# Patient Record
Sex: Female | Born: 1950 | Race: White | Hispanic: No | Smoking: Never smoker
Health system: Southern US, Community
[De-identification: ages and names within clinical notes are randomized; demographics above are authoritative.]

## PROBLEM LIST (undated history)

## (undated) DIAGNOSIS — M1990 Unspecified osteoarthritis, unspecified site: Secondary | ICD-10-CM

## (undated) DIAGNOSIS — R112 Nausea with vomiting, unspecified: Secondary | ICD-10-CM

## (undated) DIAGNOSIS — S060X9A Concussion with loss of consciousness of unspecified duration, initial encounter: Secondary | ICD-10-CM

## (undated) DIAGNOSIS — R2243 Localized swelling, mass and lump, lower limb, bilateral: Secondary | ICD-10-CM

## (undated) DIAGNOSIS — K760 Fatty (change of) liver, not elsewhere classified: Secondary | ICD-10-CM

## (undated) DIAGNOSIS — R519 Headache, unspecified: Secondary | ICD-10-CM

## (undated) DIAGNOSIS — M797 Fibromyalgia: Secondary | ICD-10-CM

## (undated) DIAGNOSIS — G5600 Carpal tunnel syndrome, unspecified upper limb: Secondary | ICD-10-CM

## (undated) DIAGNOSIS — Z9889 Other specified postprocedural states: Secondary | ICD-10-CM

## (undated) DIAGNOSIS — E041 Nontoxic single thyroid nodule: Secondary | ICD-10-CM

## (undated) DIAGNOSIS — S060XAA Concussion with loss of consciousness status unknown, initial encounter: Secondary | ICD-10-CM

## (undated) DIAGNOSIS — N840 Polyp of corpus uteri: Secondary | ICD-10-CM

## (undated) DIAGNOSIS — T8859XA Other complications of anesthesia, initial encounter: Secondary | ICD-10-CM

## (undated) DIAGNOSIS — K589 Irritable bowel syndrome without diarrhea: Secondary | ICD-10-CM

## (undated) HISTORY — DX: Irritable bowel syndrome, unspecified: K58.9

## (undated) HISTORY — DX: Carpal tunnel syndrome, unspecified upper limb: G56.00

## (undated) HISTORY — DX: Morbid (severe) obesity due to excess calories: E66.01

## (undated) HISTORY — PX: IVC FILTER REMOVAL: CATH118246

## (undated) HISTORY — PX: OTHER SURGICAL HISTORY: SHX169

## (undated) HISTORY — DX: Fatty (change of) liver, not elsewhere classified: K76.0

## (undated) HISTORY — DX: Unspecified osteoarthritis, unspecified site: M19.90

---
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
## 1973-07-10 DIAGNOSIS — Z9289 Personal history of other medical treatment: Secondary | ICD-10-CM

## 1973-07-10 HISTORY — PX: KNEE SURGERY: SHX244

## 1973-07-10 HISTORY — DX: Personal history of other medical treatment: Z92.89

## 1998-03-04 ENCOUNTER — Observation Stay (HOSPITAL_COMMUNITY): Admission: EM | Admit: 1998-03-04 | Discharge: 1998-03-05 | Payer: Self-pay | Admitting: Emergency Medicine

## 1998-03-04 ENCOUNTER — Encounter: Payer: Self-pay | Admitting: Emergency Medicine

## 2001-02-06 ENCOUNTER — Encounter: Payer: Self-pay | Admitting: Orthopaedic Surgery

## 2001-02-06 ENCOUNTER — Encounter: Admission: RE | Admit: 2001-02-06 | Discharge: 2001-02-06 | Payer: Self-pay | Admitting: Orthopaedic Surgery

## 2003-12-09 HISTORY — PX: TOTAL KNEE ARTHROPLASTY: SHX125

## 2004-05-10 HISTORY — PX: TOTAL KNEE ARTHROPLASTY: SHX125

## 2008-07-10 HISTORY — PX: OTHER SURGICAL HISTORY: SHX169

## 2008-07-10 HISTORY — PX: LIVER BIOPSY: SHX301

## 2011-10-19 DIAGNOSIS — K7581 Nonalcoholic steatohepatitis (NASH): Secondary | ICD-10-CM | POA: Insufficient documentation

## 2015-03-22 ENCOUNTER — Telehealth: Payer: Self-pay | Admitting: Behavioral Health

## 2015-03-22 NOTE — Telephone Encounter (Signed)
Unable to reach patient at time of Pre-Visit Call.  Left message for patient to return call when available.    

## 2015-03-23 ENCOUNTER — Encounter: Payer: Self-pay | Admitting: Physician Assistant

## 2015-03-23 ENCOUNTER — Ambulatory Visit (INDEPENDENT_AMBULATORY_CARE_PROVIDER_SITE_OTHER): Payer: Medicare Other | Admitting: Physician Assistant

## 2015-03-23 VITALS — BP 120/88 | HR 76 | Temp 97.7°F | Resp 16 | Ht 69.5 in | Wt 331.0 lb

## 2015-03-23 DIAGNOSIS — K7581 Nonalcoholic steatohepatitis (NASH): Secondary | ICD-10-CM

## 2015-03-23 DIAGNOSIS — M799 Soft tissue disorder, unspecified: Secondary | ICD-10-CM

## 2015-03-23 DIAGNOSIS — R938 Abnormal findings on diagnostic imaging of other specified body structures: Secondary | ICD-10-CM

## 2015-03-23 DIAGNOSIS — H579 Unspecified disorder of eye and adnexa: Secondary | ICD-10-CM | POA: Insufficient documentation

## 2015-03-23 LAB — COMPREHENSIVE METABOLIC PANEL
ALBUMIN: 4.1 g/dL (ref 3.5–5.2)
ALT: 15 U/L (ref 0–35)
AST: 20 U/L (ref 0–37)
Alkaline Phosphatase: 87 U/L (ref 39–117)
BUN: 18 mg/dL (ref 6–23)
CHLORIDE: 104 meq/L (ref 96–112)
CO2: 30 meq/L (ref 19–32)
CREATININE: 0.69 mg/dL (ref 0.40–1.20)
Calcium: 9.6 mg/dL (ref 8.4–10.5)
GFR: 91.14 mL/min (ref >60.00)
GLUCOSE: 99 mg/dL (ref 70–99)
POTASSIUM: 4.5 meq/L (ref 3.5–5.1)
SODIUM: 140 meq/L (ref 135–145)
Total Bilirubin: 0.6 mg/dL (ref 0.2–1.2)
Total Protein: 7.1 g/dL (ref 6.0–8.3)

## 2015-03-23 LAB — LIPID PANEL
CHOL/HDL RATIO: 3
Cholesterol: 193 mg/dL (ref 0–200)
HDL: 60 mg/dL (ref >39.00)
LDL CALC: 114 mg/dL — AB (ref 0–99)
NONHDL: 133.09
Triglycerides: 94 mg/dL (ref 0.0–149.0)
VLDL: 18.8 mg/dL (ref 0.0–40.0)
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
## 2015-03-23 NOTE — Progress Notes (Signed)
Pre visit review using our clinic review tool, if applicable. No additional management support is needed unless otherwise documented below in the visit note/SLS  

## 2015-03-23 NOTE — Assessment & Plan Note (Signed)
Will repeat CMP and Lipid panel.

## 2015-03-23 NOTE — Patient Instructions (Signed)
Please continue your supplements as directed. I have referred you to Dr. Bing Plume (Ophthalmology) so keep your phone on. Stay active and continue the [place] massages.  Schedule an appointment for a physical when you are due.

## 2015-03-23 NOTE — Assessment & Plan Note (Signed)
Patient in need of new OP. Referral to [facility] placed.

## 2015-03-23 NOTE — Assessment & Plan Note (Signed)
Osteoarthritis of multiple joints with foot drop and muscular weakness. Followed by PT, Chiropractor and Massage therapists. Continue current regimen. Discussed Tumeric as an antiinflammatory. Start a 500 mg capsule daily to see if this adds benefit.

## 2015-08-05 NOTE — Telephone Encounter (Signed)
Pt declines flu shot.  

## 2016-02-07 ENCOUNTER — Encounter: Payer: Self-pay | Admitting: Physician Assistant

## 2016-03-07 ENCOUNTER — Encounter: Payer: Medicare Other | Admitting: Physician Assistant

## 2016-03-08 ENCOUNTER — Other Ambulatory Visit: Payer: Self-pay | Admitting: Physician Assistant

## 2016-03-08 ENCOUNTER — Ambulatory Visit (INDEPENDENT_AMBULATORY_CARE_PROVIDER_SITE_OTHER): Payer: Medicare Other | Admitting: Physician Assistant

## 2016-03-08 VITALS — BP 130/70 | HR 91 | Temp 98.7°F | Ht 70.0 in | Wt 334.0 lb

## 2016-03-08 DIAGNOSIS — Z0000 Encounter for general adult medical examination without abnormal findings: Secondary | ICD-10-CM | POA: Diagnosis not present

## 2016-03-08 DIAGNOSIS — Z1231 Encounter for screening mammogram for malignant neoplasm of breast: Secondary | ICD-10-CM

## 2016-03-08 DIAGNOSIS — K7581 Nonalcoholic steatohepatitis (NASH): Secondary | ICD-10-CM

## 2016-03-08 LAB — CBC
HCT: 46.1 % — ABNORMAL HIGH (ref 36.0–46.0)
HEMOGLOBIN: 15.7 g/dL — AB (ref 12.0–15.0)
MCHC: 34 g/dL (ref 30.0–36.0)
MCV: 87.1 fl (ref 78.0–100.0)
Platelets: 226 10*3/uL (ref 150.0–400.0)
RBC: 5.3 Mil/uL — ABNORMAL HIGH (ref 3.87–5.11)
RDW: 13.1 % (ref 11.5–15.5)
WBC: 6.7 10*3/uL (ref 4.0–10.5)

## 2016-03-08 LAB — LIPID PANEL
Cholesterol: 239 mg/dL — ABNORMAL HIGH (ref 0–200)
HDL: 55.5 mg/dL (ref >39.00)
LDL Cholesterol: 150 mg/dL — ABNORMAL HIGH (ref 0–99)
NONHDL: 183.58
Total CHOL/HDL Ratio: 4
Triglycerides: 166 mg/dL — ABNORMAL HIGH (ref 0.0–149.0)
VLDL: 33.2 mg/dL (ref 0.0–40.0)

## 2016-03-08 LAB — URINALYSIS, ROUTINE W REFLEX MICROSCOPIC
BILIRUBIN URINE: NEGATIVE
KETONES UR: NEGATIVE
Nitrite: NEGATIVE
PH: 6.5 (ref 5.0–8.0)
Specific Gravity, Urine: 1.015 (ref 1.000–1.030)
TOTAL PROTEIN, URINE-UPE24: NEGATIVE
UROBILINOGEN UA: 0.2 (ref 0.0–1.0)
Urine Glucose: NEGATIVE

## 2016-03-08 LAB — COMPREHENSIVE METABOLIC PANEL
ALK PHOS: 82 U/L (ref 39–117)
ALT: 18 U/L (ref 0–35)
AST: 20 U/L (ref 0–37)
Albumin: 4.1 g/dL (ref 3.5–5.2)
BILIRUBIN TOTAL: 0.6 mg/dL (ref 0.2–1.2)
BUN: 12 mg/dL (ref 6–23)
CO2: 31 mEq/L (ref 19–32)
CREATININE: 0.76 mg/dL (ref 0.40–1.20)
Calcium: 9.3 mg/dL (ref 8.4–10.5)
Chloride: 105 mEq/L (ref 96–112)
GFR: 81.28 mL/min (ref >60.00)
GLUCOSE: 109 mg/dL — AB (ref 70–99)
Potassium: 4.9 mEq/L (ref 3.5–5.1)
SODIUM: 141 meq/L (ref 135–145)
TOTAL PROTEIN: 7 g/dL (ref 6.0–8.3)

## 2016-03-08 LAB — HEMOGLOBIN A1C: HEMOGLOBIN A1C: 5.7 % (ref 4.6–6.5)

## 2016-03-08 LAB — TSH: TSH: 1.4 u[IU]/mL (ref 0.35–4.50)

## 2016-03-08 NOTE — Patient Instructions (Signed)
Please go to the lab for blood work. I will call with your results.  Please have your records sent to [place] from [facility]. The ladies at the front desk/check out can give you a form to help with this.  Please check with insurance to check on coverage for TDaP and Zostavax.  I want you to put yourself first more.  You cannot take care of everyone if you do not take care of yourself first.  Start the diet when Reed Pandy starts his. Try to get back to the pool more often. Consider letting me set you up for physical therapy.   Preventive Care for Adults, Female A healthy lifestyle and preventive care can promote health and wellness. Preventive health guidelines for women include the following key practices.  A routine yearly physical is a good way to check with your health care provider about your health and preventive screening. It is a chance to share any concerns and updates on your health and to receive a thorough exam.  Visit your dentist for a routine exam and preventive care every 6 months. Brush your teeth twice a day and floss once a day. Good oral hygiene prevents tooth decay and gum disease.  The frequency of eye exams is based on your age, health, family medical history, use of contact lenses, and other factors. Follow your health care provider's recommendations for frequency of eye exams.  Eat a healthy diet. Foods like vegetables, fruits, whole grains, low-fat dairy products, and lean protein foods contain the nutrients you need without too many calories. Decrease your intake of foods high in solid fats, added sugars, and salt. Eat the right amount of calories for you.Get information about a proper diet from your health care provider, if necessary.  Regular physical exercise is one of the most important things you can do for your health. Most adults should get at least 150 minutes of moderate-intensity exercise (any activity that increases your heart rate and causes you to sweat) each  week. In addition, most adults need muscle-strengthening exercises on 2 or more days a week.  Maintain a healthy weight. The body mass index (BMI) is a screening tool to identify possible weight problems. It provides an estimate of body fat based on height and weight. Your health care provider can find your BMI and can help you achieve or maintain a healthy weight.For adults 20 years and older:  A BMI below 18.5 is considered underweight.  A BMI of 18.5 to 24.9 is normal.  A BMI of 25 to 29.9 is considered overweight.  A BMI of 30 and above is considered obese.  Maintain normal blood lipids and cholesterol levels by exercising and minimizing your intake of saturated fat. Eat a balanced diet with plenty of fruit and vegetables. Blood tests for lipids and cholesterol should begin at age 5 and be repeated every 5 years. If your lipid or cholesterol levels are high, you are over 50, or you are at high risk for heart disease, you may need your cholesterol levels checked more frequently.Ongoing high lipid and cholesterol levels should be treated with medicines if diet and exercise are not working.  If you smoke, find out from your health care provider how to quit. If you do not use tobacco, do not start.  Lung cancer screening is recommended for adults aged 73-80 years who are at high risk for developing lung cancer because of a history of smoking. A yearly low-dose CT scan of the lungs is recommended for people  who have at least a 30-pack-year history of smoking and are a current smoker or have quit within the past 15 years. A pack year of smoking is smoking an average of 1 pack of cigarettes a day for 1 year (for example: 1 pack a day for 30 years or 2 packs a day for 15 years). Yearly screening should continue until the smoker has stopped smoking for at least 15 years. Yearly screening should be stopped for people who develop a health problem that would prevent them from having lung cancer  treatment.  If you are pregnant, do not drink alcohol. If you are breastfeeding, be very cautious about drinking alcohol. If you are not pregnant and choose to drink alcohol, do not have more than 1 drink per day. One drink is considered to be 12 ounces (355 mL) of beer, 5 ounces (148 mL) of wine, or 1.5 ounces (44 mL) of liquor.  Avoid use of street drugs. Do not share needles with anyone. Ask for help if you need support or instructions about stopping the use of drugs.  High blood pressure causes heart disease and increases the risk of stroke. Your blood pressure should be checked at least every 1 to 2 years. Ongoing high blood pressure should be treated with medicines if weight loss and exercise do not work.  If you are 2-51 years old, ask your health care provider if you should take aspirin to prevent strokes.  Diabetes screening is done by taking a blood sample to check your blood glucose level after you have not eaten for a certain period of time (fasting). If you are not overweight and you do not have risk factors for diabetes, you should be screened once every 3 years starting at age 52. If you are overweight or obese and you are 84-62 years of age, you should be screened for diabetes every year as part of your cardiovascular risk assessment.  Breast cancer screening is essential preventive care for women. You should practice "breast self-awareness." This means understanding the normal appearance and feel of your breasts and may include breast self-examination. Any changes detected, no matter how small, should be reported to a health care provider. Women in their 53s and 30s should have a clinical breast exam (CBE) by a health care provider as part of a regular health exam every 1 to 3 years. After age 71, women should have a CBE every year. Starting at age 68, women should consider having a mammogram (breast X-ray test) every year. Women who have a family history of breast cancer should talk to  their health care provider about genetic screening. Women at a high risk of breast cancer should talk to their health care providers about having an MRI and a mammogram every year.  Breast cancer gene (BRCA)-related cancer risk assessment is recommended for women who have family members with BRCA-related cancers. BRCA-related cancers include breast, ovarian, tubal, and peritoneal cancers. Having family members with these cancers may be associated with an increased risk for harmful changes (mutations) in the breast cancer genes BRCA1 and BRCA2. Results of the assessment will determine the need for genetic counseling and BRCA1 and BRCA2 testing.  Your health care provider may recommend that you be screened regularly for cancer of the pelvic organs (ovaries, uterus, and vagina). This screening involves a pelvic examination, including checking for microscopic changes to the surface of your cervix (Pap test). You may be encouraged to have this screening done every 3 years, beginning at age  79.  For women ages 40-65, health care providers may recommend pelvic exams and Pap testing every 3 years, or they may recommend the Pap and pelvic exam, combined with testing for human papilloma virus (HPV), every 5 years. Some types of HPV increase your risk of cervical cancer. Testing for HPV may also be done on women of any age with unclear Pap test results.  Other health care providers may not recommend any screening for nonpregnant women who are considered low risk for pelvic cancer and who do not have symptoms. Ask your health care provider if a screening pelvic exam is right for you.  If you have had past treatment for cervical cancer or a condition that could lead to cancer, you need Pap tests and screening for cancer for at least 20 years after your treatment. If Pap tests have been discontinued, your risk factors (such as having a new sexual partner) need to be reassessed to determine if screening should resume.  Some women have medical problems that increase the chance of getting cervical cancer. In these cases, your health care provider may recommend more frequent screening and Pap tests.  Colorectal cancer can be detected and often prevented. Most routine colorectal cancer screening begins at the age of 72 years and continues through age 33 years. However, your health care provider may recommend screening at an earlier age if you have risk factors for colon cancer. On a yearly basis, your health care provider may provide home test kits to check for hidden blood in the stool. Use of a small camera at the end of a tube, to directly examine the colon (sigmoidoscopy or colonoscopy), can detect the earliest forms of colorectal cancer. Talk to your health care provider about this at age 55, when routine screening begins. Direct exam of the colon should be repeated every 5-10 years through age 42 years, unless early forms of precancerous polyps or small growths are found.  People who are at an increased risk for hepatitis B should be screened for this virus. You are considered at high risk for hepatitis B if:  You were born in a country where hepatitis B occurs often. Talk with your health care provider about which countries are considered high risk.  Your parents were born in a high-risk country and you have not received a shot to protect against hepatitis B (hepatitis B vaccine).  You have HIV or AIDS.  You use needles to inject street drugs.  You live with, or have sex with, someone who has hepatitis B.  You get hemodialysis treatment.  You take certain medicines for conditions like cancer, organ transplantation, and autoimmune conditions.  Hepatitis C blood testing is recommended for all people born from [birth year] through 1964 and any individual with known risks for hepatitis C.  Practice safe sex. Use condoms and avoid high-risk sexual practices to reduce the spread of sexually transmitted infections  (STIs). STIs include gonorrhea, chlamydia, syphilis, trichomonas, herpes, HPV, and human immunodeficiency virus (HIV). Herpes, HIV, and HPV are viral illnesses that have no cure. They can result in disability, cancer, and death.  You should be screened for sexually transmitted illnesses (STIs) including gonorrhea and chlamydia if:  You are sexually active and are younger than 24 years.  You are older than 24 years and your health care provider tells you that you are at risk for this type of infection.  Your sexual activity has changed since you were last screened and you are at an increased risk for  chlamydia or gonorrhea. Ask your health care provider if you are at risk.  If you are at risk of being infected with HIV, it is recommended that you take a prescription medicine daily to prevent HIV infection. This is called preexposure prophylaxis (PrEP). You are considered at risk if:  You are sexually active and do not regularly use condoms or know the HIV status of your partner(s).  You take drugs by injection.  You are sexually active with a partner who has HIV.  Talk with your health care provider about whether you are at high risk of being infected with HIV. If you choose to begin PrEP, you should first be tested for HIV. You should then be tested every 3 months for as long as you are taking PrEP.  Osteoporosis is a disease in which the bones lose minerals and strength with aging. This can result in serious bone fractures or breaks. The risk of osteoporosis can be identified using a bone density scan. Women ages 10 years and over and women at risk for fractures or osteoporosis should discuss screening with their health care providers. Ask your health care provider whether you should take a calcium supplement or vitamin D to reduce the rate of osteoporosis.  Menopause can be associated with physical symptoms and risks. Hormone replacement therapy is available to decrease symptoms and risks.  You should talk to your health care provider about whether hormone replacement therapy is right for you.  Use sunscreen. Apply sunscreen liberally and repeatedly throughout the day. You should seek shade when your shadow is shorter than you. Protect yourself by wearing long sleeves, pants, a wide-brimmed hat, and sunglasses year round, whenever you are outdoors.  Once a month, do a whole body skin exam, using a mirror to look at the skin on your back. Tell your health care provider of new moles, moles that have irregular borders, moles that are larger than a pencil eraser, or moles that have changed in shape or color.  Stay current with required vaccines (immunizations).  Influenza vaccine. All adults should be immunized every year.  Tetanus, diphtheria, and acellular pertussis (Td, Tdap) vaccine. Pregnant women should receive 1 dose of Tdap vaccine during each pregnancy. The dose should be obtained regardless of the length of time since the last dose. Immunization is preferred during the 27th-36th week of gestation. An adult who has not previously received Tdap or who does not know her vaccine status should receive 1 dose of Tdap. This initial dose should be followed by tetanus and diphtheria toxoids (Td) booster doses every 10 years. Adults with an unknown or incomplete history of completing a 3-dose immunization series with Td-containing vaccines should begin or complete a primary immunization series including a Tdap dose. Adults should receive a Td booster every 10 years.  Varicella vaccine. An adult without evidence of immunity to varicella should receive 2 doses or a second dose if she has previously received 1 dose. Pregnant females who do not have evidence of immunity should receive the first dose after pregnancy. This first dose should be obtained before leaving the health care facility. The second dose should be obtained 4-8 weeks after the first dose.  Human papillomavirus (HPV) vaccine.  Females aged 13-26 years who have not received the vaccine previously should obtain the 3-dose series. The vaccine is not recommended for use in pregnant females. However, pregnancy testing is not needed before receiving a dose. If a female is found to be pregnant after receiving a dose,  no treatment is needed. In that case, the remaining doses should be delayed until after the pregnancy. Immunization is recommended for any person with an immunocompromised condition through the age of 56 years if she did not get any or all doses earlier. During the 3-dose series, the second dose should be obtained 4-8 weeks after the first dose. The third dose should be obtained 24 weeks after the first dose and 16 weeks after the second dose.  Zoster vaccine. One dose is recommended for adults aged 43 years or older unless certain conditions are present.  Measles, mumps, and rubella (MMR) vaccine. Adults born before 65 generally are considered immune to measles and mumps. Adults born in [birth year] or later should have 1 or more doses of MMR vaccine unless there is a contraindication to the vaccine or there is laboratory evidence of immunity to each of the three diseases. A routine second dose of MMR vaccine should be obtained at least 28 days after the first dose for students attending postsecondary schools, health care workers, or international travelers. People who received inactivated measles vaccine or an unknown type of measles vaccine during 1963-1967 should receive 2 doses of MMR vaccine. People who received inactivated mumps vaccine or an unknown type of mumps vaccine before 1979 and are at high risk for mumps infection should consider immunization with 2 doses of MMR vaccine. For females of childbearing age, rubella immunity should be determined. If there is no evidence of immunity, females who are not pregnant should be vaccinated. If there is no evidence of immunity, females who are pregnant should delay immunization  until after pregnancy. Unvaccinated health care workers born before [birth year] who lack laboratory evidence of measles, mumps, or rubella immunity or laboratory confirmation of disease should consider measles and mumps immunization with 2 doses of MMR vaccine or rubella immunization with 1 dose of MMR vaccine.  Pneumococcal 13-valent conjugate (PCV13) vaccine. When indicated, a person who is uncertain of his immunization history and has no record of immunization should receive the PCV13 vaccine. All adults 50 years of age and older should receive this vaccine. An adult aged 3 years or older who has certain medical conditions and has not been previously immunized should receive 1 dose of PCV13 vaccine. This PCV13 should be followed with a dose of pneumococcal polysaccharide (PPSV23) vaccine. Adults who are at high risk for pneumococcal disease should obtain the PPSV23 vaccine at least 8 weeks after the dose of PCV13 vaccine. Adults older than 64 years of age who have normal immune system function should obtain the PPSV23 vaccine dose at least 1 year after the dose of PCV13 vaccine.  Pneumococcal polysaccharide (PPSV23) vaccine. When PCV13 is also indicated, PCV13 should be obtained first. All adults aged 12 years and older should be immunized. An adult younger than age 32 years who has certain medical conditions should be immunized. Any person who resides in a nursing home or long-term care facility should be immunized. An adult smoker should be immunized. People with an immunocompromised condition and certain other conditions should receive both PCV13 and PPSV23 vaccines. People with human immunodeficiency virus (HIV) infection should be immunized as soon as possible after diagnosis. Immunization during chemotherapy or radiation therapy should be avoided. Routine use of PPSV23 vaccine is not recommended for American Indians, [place] Natives, or people younger than 65 years unless there are medical conditions that  require PPSV23 vaccine. When indicated, people who have unknown immunization and have no record of immunization should receive PPSV23 vaccine.  One-time revaccination 5 years after the first dose of PPSV23 is recommended for people aged 19-64 years who have chronic kidney failure, nephrotic syndrome, asplenia, or immunocompromised conditions. People who received 1-2 doses of PPSV23 before age 15 years should receive another dose of PPSV23 vaccine at age 46 years or later if at least 5 years have passed since the previous dose. Doses of PPSV23 are not needed for people immunized with PPSV23 at or after age 80 years.  Meningococcal vaccine. Adults with asplenia or persistent complement component deficiencies should receive 2 doses of quadrivalent meningococcal conjugate (MenACWY-D) vaccine. The doses should be obtained at least 2 months apart. Microbiologists working with certain meningococcal bacteria, [employer] recruits, people at risk during an outbreak, and people who travel to or live in countries with a high rate of meningitis should be immunized. A first-year college student up through age 7 years who is living in a residence hall should receive a dose if she did not receive a dose on or after her 16th birthday. Adults who have certain high-risk conditions should receive one or more doses of vaccine.  Hepatitis A vaccine. Adults who wish to be protected from this disease, have certain high-risk conditions, work with hepatitis A-infected animals, work in hepatitis A research labs, or travel to or work in countries with a high rate of hepatitis A should be immunized. Adults who were previously unvaccinated and who anticipate close contact with an international adoptee during the first 60 days after arrival in the [place] from a country with a high rate of hepatitis A should be immunized.  Hepatitis B vaccine. Adults who wish to be protected from this disease, have certain high-risk conditions, may be  exposed to blood or other infectious body fluids, are household contacts or sex partners of hepatitis B positive people, are clients or workers in certain care facilities, or travel to or work in countries with a high rate of hepatitis B should be immunized.  Haemophilus influenzae type b (Hib) vaccine. A previously unvaccinated person with asplenia or sickle cell disease or having a scheduled splenectomy should receive 1 dose of Hib vaccine. Regardless of previous immunization, a recipient of a hematopoietic stem cell transplant should receive a 3-dose series 6-12 months after her successful transplant. Hib vaccine is not recommended for adults with HIV infection. Preventive Services / Frequency Ages 85 to 59 years  Blood pressure check.** / Every 3-5 years.  Lipid and cholesterol check.** / Every 5 years beginning at age 39.  Clinical breast exam.** / Every 3 years for women in their 73s and 59s.  BRCA-related cancer risk assessment.** / For women who have family members with a BRCA-related cancer (breast, ovarian, tubal, or peritoneal cancers).  Pap test.** / Every 2 years from ages 62 through 79. Every 3 years starting at age 26 through age 81 or 16 with a history of 3 consecutive normal Pap tests.  HPV screening.** / Every 3 years from ages 38 through ages 67 to 107 with a history of 3 consecutive normal Pap tests.  Hepatitis C blood test.** / For any individual with known risks for hepatitis C.  Skin self-exam. / Monthly.  Influenza vaccine. / Every year.  Tetanus, diphtheria, and acellular pertussis (Tdap, Td) vaccine.** / Consult your health care provider. Pregnant women should receive 1 dose of Tdap vaccine during each pregnancy. 1 dose of Td every 10 years.  Varicella vaccine.** / Consult your health care provider. Pregnant females who do not have evidence of  immunity should receive the first dose after pregnancy.  HPV vaccine. / 3 doses over 6 months, if 74 and younger. The  vaccine is not recommended for use in pregnant females. However, pregnancy testing is not needed before receiving a dose.  Measles, mumps, rubella (MMR) vaccine.** / You need at least 1 dose of MMR if you were born in 1957 or later. You may also need a 2nd dose. For females of childbearing age, rubella immunity should be determined. If there is no evidence of immunity, females who are not pregnant should be vaccinated. If there is no evidence of immunity, females who are pregnant should delay immunization until after pregnancy.  Pneumococcal 13-valent conjugate (PCV13) vaccine.** / Consult your health care provider.  Pneumococcal polysaccharide (PPSV23) vaccine.** / 1 to 2 doses if you smoke cigarettes or if you have certain conditions.  Meningococcal vaccine.** / 1 dose if you are age 52 to 5 years and a Market researcher living in a residence hall, or have one of several medical conditions, you need to get vaccinated against meningococcal disease. You may also need additional booster doses.  Hepatitis A vaccine.** / Consult your health care provider.  Hepatitis B vaccine.** / Consult your health care provider.  Haemophilus influenzae type b (Hib) vaccine.** / Consult your health care provider. Ages 38 to 43 years  Blood pressure check.** / Every year.  Lipid and cholesterol check.** / Every 5 years beginning at age 23 years.  Lung cancer screening. / Every year if you are aged 8-80 years and have a 30-pack-year history of smoking and currently smoke or have quit within the past 15 years. Yearly screening is stopped once you have quit smoking for at least 15 years or develop a health problem that would prevent you from having lung cancer treatment.  Clinical breast exam.** / Every year after age 42 years.  BRCA-related cancer risk assessment.** / For women who have family members with a BRCA-related cancer (breast, ovarian, tubal, or peritoneal cancers).  Mammogram.** / Every  year beginning at age 58 years and continuing for as long as you are in good health. Consult with your health care provider.  Pap test.** / Every 3 years starting at age 23 years through age 93 or 82 years with a history of 3 consecutive normal Pap tests.  HPV screening.** / Every 3 years from ages 8 years through ages 55 to 55 years with a history of 3 consecutive normal Pap tests.  Fecal occult blood test (FOBT) of stool. / Every year beginning at age 42 years and continuing until age 54 years. You may not need to do this test if you get a colonoscopy every 10 years.  Flexible sigmoidoscopy or colonoscopy.** / Every 5 years for a flexible sigmoidoscopy or every 10 years for a colonoscopy beginning at age 66 years and continuing until age 56 years.  Hepatitis C blood test.** / For all people born from [birth year] through 1964 and any individual with known risks for hepatitis C.  Skin self-exam. / Monthly.  Influenza vaccine. / Every year.  Tetanus, diphtheria, and acellular pertussis (Tdap/Td) vaccine.** / Consult your health care provider. Pregnant women should receive 1 dose of Tdap vaccine during each pregnancy. 1 dose of Td every 10 years.  Varicella vaccine.** / Consult your health care provider. Pregnant females who do not have evidence of immunity should receive the first dose after pregnancy.  Zoster vaccine.** / 1 dose for adults aged 49 years or older.  Measles,  mumps, rubella (MMR) vaccine.** / You need at least 1 dose of MMR if you were born in 1957 or later. You may also need a second dose. For females of childbearing age, rubella immunity should be determined. If there is no evidence of immunity, females who are not pregnant should be vaccinated. If there is no evidence of immunity, females who are pregnant should delay immunization until after pregnancy.  Pneumococcal 13-valent conjugate (PCV13) vaccine.** / Consult your health care provider.  Pneumococcal polysaccharide (PPSV23)  vaccine.** / 1 to 2 doses if you smoke cigarettes or if you have certain conditions.  Meningococcal vaccine.** / Consult your health care provider.  Hepatitis A vaccine.** / Consult your health care provider.  Hepatitis B vaccine.** / Consult your health care provider.  Haemophilus influenzae type b (Hib) vaccine.** / Consult your health care provider. Ages 65 years and over  Blood pressure check.** / Every year.  Lipid and cholesterol check.** / Every 5 years beginning at age 43 years.  Lung cancer screening. / Every year if you are aged 73-80 years and have a 30-pack-year history of smoking and currently smoke or have quit within the past 15 years. Yearly screening is stopped once you have quit smoking for at least 15 years or develop a health problem that would prevent you from having lung cancer treatment.  Clinical breast exam.** / Every year after age 29 years.  BRCA-related cancer risk assessment.** / For women who have family members with a BRCA-related cancer (breast, ovarian, tubal, or peritoneal cancers).  Mammogram.** / Every year beginning at age 78 years and continuing for as long as you are in good health. Consult with your health care provider.  Pap test.** / Every 3 years starting at age 54 years through age 62 or 63 years with 3 consecutive normal Pap tests. Testing can be stopped between 65 and 70 years with 3 consecutive normal Pap tests and no abnormal Pap or HPV tests in the past 10 years.  HPV screening.** / Every 3 years from ages 66 years through ages 79 or 68 years with a history of 3 consecutive normal Pap tests. Testing can be stopped between 65 and 70 years with 3 consecutive normal Pap tests and no abnormal Pap or HPV tests in the past 10 years.  Fecal occult blood test (FOBT) of stool. / Every year beginning at age 57 years and continuing until age 33 years. You may not need to do this test if you get a colonoscopy every 10 years.  Flexible sigmoidoscopy  or colonoscopy.** / Every 5 years for a flexible sigmoidoscopy or every 10 years for a colonoscopy beginning at age 78 years and continuing until age 28 years.  Hepatitis C blood test.** / For all people born from [birth year] through 1964 and any individual with known risks for hepatitis C.  Osteoporosis screening.** / A one-time screening for women ages 47 years and over and women at risk for fractures or osteoporosis.  Skin self-exam. / Monthly.  Influenza vaccine. / Every year.  Tetanus, diphtheria, and acellular pertussis (Tdap/Td) vaccine.** / 1 dose of Td every 10 years.  Varicella vaccine.** / Consult your health care provider.  Zoster vaccine.** / 1 dose for adults aged 11 years or older.  Pneumococcal 13-valent conjugate (PCV13) vaccine.** / Consult your health care provider.  Pneumococcal polysaccharide (PPSV23) vaccine.** / 1 dose for all adults aged 2 years and older.  Meningococcal vaccine.** / Consult your health care provider.  Hepatitis A vaccine.** /  Consult your health care provider.  Hepatitis B vaccine.** / Consult your health care provider.  Haemophilus influenzae type b (Hib) vaccine.** / Consult your health care provider. ** Family history and personal history of risk and conditions may change your health care provider's recommendations.   This information is not intended to replace advice given to you by your health care provider. Make sure you discuss any questions you have with your health care provider.   Document Released: 08/22/2001 Document Revised: 07/17/2014 Document Reviewed: 11/21/2010 Elsevier Interactive Patient Education [employer].

## 2016-03-08 NOTE — Progress Notes (Signed)
Subjective:    Leslie Knapp is a 65 y.o. female who presents for Medicare Annual/Subsequent Wellness visit and Complete Physical Examination  Preventive Screening-Counseling & Management  Tobacco History  Smoking Status  . Never Smoker  Smokeless Tobacco  . Not on file     Problems Prior to Visit NASH -- diagnosed vial liver biopsy in 2013. Not currently on medication. Is watching diet. Body mass index is 47.92 kg/m.   Current Problems (verified) Patient Active Problem List   Diagnosis Date Noted  . Disorder of musculoskeletal system 03/23/2015  . Eye exam abnormal 03/23/2015  . NASH (nonalcoholic steatohepatitis) 10/19/2011    Medications Prior to Visit Current Outpatient Prescriptions on File Prior to Visit  Medication Sig Dispense Refill  . aspirin 81 MG tablet Take 81 mg by mouth daily.    Marland Kitchen aspirin-acetaminophen-caffeine (EXCEDRIN MIGRAINE) 250-250-65 MG per tablet Take 2 tablets by mouth every 6 (six) hours as needed for headache.    . Calcium Carbonate 1500 (600 CA) MG TABS Take 2 tablets by mouth daily.    . Cholecalciferol (VITAMIN D-3) 1000 UNITS CAPS Take 1 each by mouth daily.    . diphenhydramine-acetaminophen (TYLENOL PM) 25-500 MG TABS Take 2 tablets by mouth at bedtime as needed (Pain & Sleep).    . NON FORMULARY ROLFING: Deep Tissue Massage, 2 Txs weekly as needed for joint pain    . Omega-3 Fatty Acids (OMEGA-3 FISH OIL) 1200 MG CAPS Take 2 each by mouth daily. Omega 3 Fish Oil 360-1200mg      No current facility-administered medications on file prior to visit.     Current Medications (verified) Current Outpatient Prescriptions  Medication Sig Dispense Refill  . aspirin 81 MG tablet Take 81 mg by mouth daily.    Marland Kitchen aspirin-acetaminophen-caffeine (EXCEDRIN MIGRAINE) 250-250-65 MG per tablet Take 2 tablets by mouth every 6 (six) hours as needed for headache.    . Calcium Carbonate 1500 (600 CA) MG TABS Take 2 tablets by mouth daily.    . Cholecalciferol  (VITAMIN D-3) 1000 UNITS CAPS Take 1 each by mouth daily.    . diphenhydramine-acetaminophen (TYLENOL PM) 25-500 MG TABS Take 2 tablets by mouth at bedtime as needed (Pain & Sleep).    . NON FORMULARY ROLFING: Deep Tissue Massage, 2 Txs weekly as needed for joint pain    . Omega-3 Fatty Acids (OMEGA-3 FISH OIL) 1200 MG CAPS Take 2 each by mouth daily. Omega 3 Fish Oil 360-1200mg     . Tdap (BOOSTRIX) 5-2.5-18.5 LF-MCG/0.5 injection Inject 0.5 mLs into the muscle once. 0.5 mL 0  . Zoster Vaccine Live, PF, (ZOSTAVAX) 60454 UNT/0.65ML injection Inject 19,400 Units into the skin once. 1 each 0   No current facility-administered medications for this visit.      Allergies (verified) Lactose intolerance (gi) and Celebrex [celecoxib]   PAST HISTORY  Family History Family History  Problem Relation Age of Onset  . Stroke Mother 58    Deceased  . Hypertension Mother   . GI problems Mother   . Arthritis Mother   . Dementia Mother   . Heart defect Father 4    Deceased  . Parkinson's disease Father   . Stroke Maternal Grandfather   . Arthritis Maternal Grandfather   . Kidney failure Maternal Grandmother   . Gallbladder disease Maternal Grandmother   . Diverticulitis Sister     #1  . Arthritis Sister   . Healthy Sister     #2    Social History Social History  Substance Use Topics  . Smoking status: Never Smoker  . Smokeless tobacco: Not on file  . Alcohol use Not on file     Are there smokers in your home (other than you)? No  Risk Factors Current exercise habits: The patient does not participate in regular exercise at present. Previously going to the pool 3 x week but is not taking care of her husband who has multiple health issues. Dietary issues discussed: Body mass index is 47.92 kg/m. Does eat a well-balanced diet.    Cardiac risk factors: obesity (BMI >= 30 kg/m2) and sedentary lifestyle.  Depression Screen (Note: if answer to either of the following is "Yes", a more  complete depression screening is indicated)   Over the past two weeks, have you felt down, depressed or hopeless? No  Over the past two weeks, have you felt little interest or pleasure in doing things? No  Have you lost interest or pleasure in daily life? No  Do you often feel hopeless? No  Do you cry easily over simple problems? No   Activities of Daily Living In your present state of health, do you have any difficulty performing the following activities?:  Driving? No Managing money?  No Feeding yourself? No Getting from bed to chair? No Climbing a flight of stairs? No Preparing food and eating?: No Bathing or showering? No Getting dressed: No Getting to the toilet? No Using the toilet:No Moving around from place to place: No In the past year have you fallen or had a near fall?:No   Are you sexually active?  No  Do you have more than one partner?  N/A  Hearing Difficulties: No Do you often ask people to speak up or repeat themselves? No Do you experience ringing or noises in your ears? No Do you have difficulty understanding soft or whispered voices? No   Do you feel that you have a problem with memory? No  Do you often misplace items? No  Do you feel safe at home?  Yes  Cognitive Testing  Alert? Yes  Normal Appearance?Yes  Oriented to person? Yes  Place? Yes   Time? Yes  Recall of three objects?  Yes  Can perform simple calculations? Yes  Displays appropriate judgment?Yes   Advanced Directives have been discussed with the patient? Yes  List the Names of Other Physician/Practitioners you currently use: See EMR for comprehensive list of providers.  Indicate any recent Medical Services you may have received from other than [facility] providers in the past year (date may be approximate).   There is no immunization history on file for this patient.  Screening Tests Health Maintenance  Topic Date Due  . Hepatitis C Screening  February 02, 1951  . HIV Screening  07/27/1966  .  DTaP/Tdap/Td (1 - Tdap) 07/27/1970  . ZOSTAVAX  07/28/2011  . INFLUENZA VACCINE  02/08/2016  . TETANUS/TDAP  03/08/2017 (Originally 07/27/1970)  . MAMMOGRAM  08/10/2016  . PAP SMEAR  01/07/2018  . COLONOSCOPY  07/11/2019    All answers were reviewed with the patient and necessary referrals were made:  Leeanne Rio, PA-C   03/10/2016   History reviewed: allergies, current medications, past family history, past medical history, past social history, past surgical history and problem list  Review of Systems A comprehensive review of systems was negative.    Objective:   Body mass index is 47.92 kg/m. BP 130/70   Pulse 91   Temp 98.7 F (37.1 C)   Ht 5\' 10"  (1.778 m)  Wt (!) 334 lb (151.5 kg)   SpO2 97%   BMI 47.92 kg/m   General appearance: alert, cooperative, appears stated age, no distress and morbidly obese Head: Normocephalic, without obvious abnormality, atraumatic Ears: normal TM's and external ear canals both ears Nose: Nares normal. Septum midline. Mucosa normal. No drainage or sinus tenderness. Throat: lips, mucosa, and tongue normal; teeth and gums normal Lungs: clear to auscultation bilaterally Heart: regular rate and rhythm, S1, S2 normal, no murmur, click, rub or gallop Abdomen: soft, non-tender; bowel sounds normal; no masses,  no organomegaly Extremities: extremities normal, atraumatic, no cyanosis or edema Pulses: 2+ and symmetric Neurologic: Alert and oriented X 3, normal strength and tone. Normal symmetric reflexes. Normal coordination and gait     Assessment:     (1) Medicare Wellness, Subsequent (2) Annual Physical Examination (3) NASH (4) Morbid Obesity     Plan:     (1) During the course of the visit the patient was educated and counseled about appropriate screening and preventive services including:    Td vaccine  Diabetes screening  Nutrition counseling   Zostavax  Diet review for nutrition referral? Yes __x__  Not  Indicated ____  (2) Depression screen negative. Health Maintenance reviewed -- Will check on coverage of TDaP and Zostavax. Prescriptions provided. Preventive schedule discussed and handout given in AVS. Will obtain fasting labs today.  (3) Weight a contributing factor. Discussed diet and exercise recommendations. Will check fasting labs today.  (4) Body mass index is 47.92 kg/m. Diet and exercise recommendations given. Has appointment with Nutrition. Would like to get back to swimming 3 x week. 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   Patient Instructions (the written plan) was given to the patient.  Medicare Attestation I have personally reviewed: The patient's medical and social history Their use of alcohol, tobacco or illicit drugs Their current medications and supplements The patient's functional ability including ADLs,fall risks, home safety risks, cognitive, and hearing and visual impairment Diet and physical activities Evidence for depression or mood disorders  The patient's weight, height, BMI, and visual acuity have been recorded in the chart.  I have made referrals, counseling, and provided education to the patient based on review of the above and I have provided the patient with a written personalized care plan for preventive services.     Raiford Noble [place], [place]   03/10/2016

## 2016-03-10 ENCOUNTER — Encounter: Payer: Self-pay | Admitting: Physician Assistant

## 2016-03-10 MED ORDER — ZOSTER VACCINE LIVE 19400 UNT/0.65ML ~~LOC~~ SUSR: 0.6500 mL | Freq: Once | SUBCUTANEOUS | 0 refills | Status: AC

## 2016-03-10 MED ORDER — TETANUS-DIPHTH-ACELL PERTUSSIS 5-2.5-18.5 LF-MCG/0.5 IM SUSP: 0.5000 mL | Freq: Once | INTRAMUSCULAR | 0 refills | Status: AC

## 2016-03-10 NOTE — Telephone Encounter (Signed)
Please advise if it is ok to send the scripts to [facility].    KP

## 2016-03-11 ENCOUNTER — Encounter: Payer: Self-pay | Admitting: Physician Assistant

## 2016-03-11 DIAGNOSIS — D751 Secondary polycythemia: Secondary | ICD-10-CM

## 2016-03-11 DIAGNOSIS — R3915 Urgency of urination: Secondary | ICD-10-CM

## 2016-03-28 ENCOUNTER — Other Ambulatory Visit (INDEPENDENT_AMBULATORY_CARE_PROVIDER_SITE_OTHER): Payer: Medicare Other

## 2016-03-28 ENCOUNTER — Ambulatory Visit (HOSPITAL_BASED_OUTPATIENT_CLINIC_OR_DEPARTMENT_OTHER)
Admission: RE | Admit: 2016-03-28 | Discharge: 2016-03-28 | Disposition: A | Discharge status: Home / Self Care | Payer: Medicare Other | Source: Ambulatory Visit | Attending: Physician Assistant | Admitting: Physician Assistant

## 2016-03-28 ENCOUNTER — Encounter: Payer: Self-pay | Admitting: Physician Assistant

## 2016-03-28 DIAGNOSIS — D751 Secondary polycythemia: Secondary | ICD-10-CM

## 2016-03-28 DIAGNOSIS — R3915 Urgency of urination: Secondary | ICD-10-CM

## 2016-03-28 DIAGNOSIS — Z1231 Encounter for screening mammogram for malignant neoplasm of breast: Secondary | ICD-10-CM | POA: Insufficient documentation

## 2016-03-28 LAB — URINALYSIS, ROUTINE W REFLEX MICROSCOPIC
BILIRUBIN URINE: NEGATIVE
HGB URINE DIPSTICK: NEGATIVE
Ketones, ur: NEGATIVE
NITRITE: NEGATIVE
RBC / HPF: NONE SEEN (ref 0–?)
Specific Gravity, Urine: <=1.005 — AB (ref 1.000–1.030)
Total Protein, Urine: NEGATIVE
Urine Glucose: NEGATIVE
Urobilinogen, UA: 0.2 (ref 0.0–1.0)
pH: 6 (ref 5.0–8.0)

## 2016-03-28 LAB — CBC WITH DIFFERENTIAL/PLATELET
BASOS PCT: 0.5 % (ref 0.0–3.0)
Basophils Absolute: 0 10*3/uL (ref 0.0–0.1)
EOS PCT: 3.5 % (ref 0.0–5.0)
Eosinophils Absolute: 0.2 10*3/uL (ref 0.0–0.7)
HEMATOCRIT: 45.9 % (ref 36.0–46.0)
HEMOGLOBIN: 15.7 g/dL — AB (ref 12.0–15.0)
LYMPHS PCT: 25.5 % (ref 12.0–46.0)
Lymphs Abs: 1.7 10*3/uL (ref 0.7–4.0)
MCHC: 34.2 g/dL (ref 30.0–36.0)
MCV: 86.6 fl (ref 78.0–100.0)
MONOS PCT: 6.9 % (ref 3.0–12.0)
Monocytes Absolute: 0.5 10*3/uL (ref 0.1–1.0)
Neutro Abs: 4.2 10*3/uL (ref 1.4–7.7)
Neutrophils Relative %: 63.6 % (ref 43.0–77.0)
Platelets: 223 10*3/uL (ref 150.0–400.0)
RBC: 5.3 Mil/uL — ABNORMAL HIGH (ref 3.87–5.11)
RDW: 13.5 % (ref 11.5–15.5)
WBC: 6.6 10*3/uL (ref 4.0–10.5)

## 2016-03-28 LAB — FERRITIN: Ferritin: 136.2 ng/mL (ref 10.0–291.0)

## 2016-04-01 LAB — CULTURE, URINE COMPREHENSIVE

## 2016-04-03 ENCOUNTER — Encounter: Payer: Self-pay | Admitting: Physician Assistant

## 2016-04-07 ENCOUNTER — Ambulatory Visit (INDEPENDENT_AMBULATORY_CARE_PROVIDER_SITE_OTHER): Payer: Medicare Other

## 2016-04-07 DIAGNOSIS — Z23 Encounter for immunization: Secondary | ICD-10-CM

## 2016-04-26 ENCOUNTER — Encounter: Payer: Self-pay | Admitting: Physician Assistant

## 2016-04-26 MED ORDER — AMOXICILLIN-POT CLAVULANATE 500-125 MG PO TABS: 1.0000 | ORAL_TABLET | Freq: Two times a day (BID) | ORAL | 0 refills | Status: DC

## 2016-05-15 ENCOUNTER — Encounter: Payer: Self-pay | Admitting: Physician Assistant

## 2016-05-15 ENCOUNTER — Ambulatory Visit (INDEPENDENT_AMBULATORY_CARE_PROVIDER_SITE_OTHER): Payer: Medicare Other | Admitting: Physician Assistant

## 2016-05-15 VITALS — BP 102/72 | HR 80 | Temp 98.1°F | Resp 16 | Ht 70.0 in | Wt 334.4 lb

## 2016-05-15 DIAGNOSIS — R8290 Unspecified abnormal findings in urine: Secondary | ICD-10-CM

## 2016-05-15 DIAGNOSIS — K529 Noninfective gastroenteritis and colitis, unspecified: Secondary | ICD-10-CM | POA: Diagnosis not present

## 2016-05-15 NOTE — Progress Notes (Signed)
Patient presents to clinic today to discuss multiple issues.  Patient endorses experiencing 7 days of loose stools. Endorses at onset of symptoms, noted very liquid stools, worsening over days 2 and 3. Noted subjective fever on day 2 with chills at sweats. Endorses she did not check temperature. Notes this resolved. Patient endorses frequency back to normal but did not have a formed stool until today.  Denies abnormal food or water source. Denies blood in the stools. Endorses two family members with the same symptoms that have resolved since then. Has stayed well hydrated. Has been eating regular diet despite symptoms.   Patient has forms for re-certification of disability for insurance that she should like looked over.   Patient also with recent Group B strep noted on Urine culture. No symptoms. Has not started antibiotic due to GI illness.  Past Medical History:  Diagnosis Date  . CTS (carpal tunnel syndrome)   . Fatty liver disease, nonalcoholic    In [facility] Study  . IBS (irritable bowel syndrome)    C/D  . Osteoarthritis     Current Outpatient Prescriptions on File Prior to Visit  Medication Sig Dispense Refill  . aspirin 81 MG tablet Take 81 mg by mouth daily.    Marland Kitchen aspirin-acetaminophen-caffeine (EXCEDRIN MIGRAINE) 250-250-65 MG per tablet Take 2 tablets by mouth every 6 (six) hours as needed for headache.    . Calcium Carbonate 1500 (600 CA) MG TABS Take 2 tablets by mouth daily.    . Cholecalciferol (VITAMIN D-3) 1000 UNITS CAPS Take 1 each by mouth daily.    . diphenhydramine-acetaminophen (TYLENOL PM) 25-500 MG TABS Take 2 tablets by mouth at bedtime as needed (Pain & Sleep).    . NON FORMULARY ROLFING: Deep Tissue Massage, 2 Txs weekly as needed for joint pain    . Omega-3 Fatty Acids (OMEGA-3 FISH OIL) 1200 MG CAPS Take 2 each by mouth daily. Omega 3 Fish Oil 360-1200mg     . amoxicillin-clavulanate (AUGMENTIN) 500-125 MG tablet Take 1 tablet (500 mg total) by mouth 2 (two)  times daily. (Patient not taking: Reported on 05/15/2016) 14 tablet 0   No current facility-administered medications on file prior to visit.     Allergies  Allergen Reactions  . Lactose Intolerance (Gi)   . Celebrex [Celecoxib] Swelling and Rash    Family History  Problem Relation Age of Onset  . Stroke Mother 96    Deceased  . Hypertension Mother   . GI problems Mother   . Arthritis Mother   . Dementia Mother   . Heart defect Father 63    Deceased  . Parkinson's disease Father   . Stroke Maternal Grandfather   . Arthritis Maternal Grandfather   . Kidney failure Maternal Grandmother   . Gallbladder disease Maternal Grandmother   . Diverticulitis Sister     #1  . Arthritis Sister   . Healthy Sister     #2    Social History   Social History  . Marital status: Married    Spouse name: N/A  . Number of children: N/A  . Years of education: N/A   Social History Main Topics  . Smoking status: Never Smoker  . Smokeless tobacco: None  . Alcohol use None  . Drug use: Unknown  . Sexual activity: Not Asked   Other Topics Concern  . None   Social History Narrative  . None   Review of Systems - See HPI.  All other ROS are negative.  BP 102/72 (  BP Location: Left Arm, Patient Position: Sitting, Cuff Size: Large)   Pulse 80   Temp 98.1 F (36.7 C) (Oral)   Resp 16   Ht 5\' 10"  (1.778 m)   Wt (!) 334 lb 6 oz (151.7 kg)   SpO2 96%   BMI 47.98 kg/m   Physical Exam  Constitutional: She is oriented to person, place, and time and well-developed, well-nourished, and in no distress.  HENT:  Head: Normocephalic and atraumatic.  Cardiovascular: Normal rate, regular rhythm, normal heart sounds and intact distal pulses.   Pulmonary/Chest: Effort normal and breath sounds normal.  Abdominal: Soft. Bowel sounds are normal. She exhibits no distension and no mass. There is no tenderness. There is no rebound and no guarding.  Neurological: She is alert and oriented to person,  place, and time.  Skin: Skin is warm and dry. No rash noted.  Psychiatric: Affect normal.  Vitals reviewed.   Recent Results (from the past 2160 hour(s))  CBC     Status: Abnormal   Collection Time: 03/08/16  8:31 AM  Result Value Ref Range   WBC 6.7 4.0 - 10.5 K/uL   RBC 5.30 (H) 3.87 - 5.11 Mil/uL   Platelets 226.0 150.0 - 400.0 K/uL   Hemoglobin 15.7 (H) 12.0 - 15.0 g/dL   HCT 46.1 (H) 36.0 - 46.0 %   MCV 87.1 78.0 - 100.0 fl   MCHC 34.0 30.0 - 36.0 g/dL   RDW 13.1 11.5 - 15.5 %  Comprehensive metabolic panel     Status: Abnormal   Collection Time: 03/08/16  8:31 AM  Result Value Ref Range   Sodium 141 135 - 145 mEq/L   Potassium 4.9 3.5 - 5.1 mEq/L   Chloride 105 96 - 112 mEq/L   CO2 31 19 - 32 mEq/L   Glucose, Bld 109 (H) 70 - 99 mg/dL   BUN 12 6 - 23 mg/dL   Creatinine, Ser 0.76 0.40 - 1.20 mg/dL   Total Bilirubin 0.6 0.2 - 1.2 mg/dL   Alkaline Phosphatase 82 39 - 117 U/L   AST 20 0 - 37 U/L   ALT 18 0 - 35 U/L   Total Protein 7.0 6.0 - 8.3 g/dL   Albumin 4.1 3.5 - 5.2 g/dL   Calcium 9.3 8.4 - 10.5 mg/dL   GFR 81.28 >60.00 mL/min  Lipid panel     Status: Abnormal   Collection Time: 03/08/16  8:31 AM  Result Value Ref Range   Cholesterol 239 (H) 0 - 200 mg/dL    Comment: ATP III Classification       Desirable:  < 200 mg/dL               Borderline High:  200 - 239 mg/dL          High:  > = 240 mg/dL   Triglycerides 166.0 (H) 0.0 - 149.0 mg/dL    Comment: Normal:  <150 mg/dLBorderline High:  150 - 199 mg/dL   HDL 55.50 >39.00 mg/dL   VLDL 33.2 0.0 - 40.0 mg/dL   LDL Cholesterol 150 (H) 0 - 99 mg/dL   Total CHOL/HDL Ratio 4     Comment:                Men          Women1/2 Average Risk     3.4          3.3Average Risk          5.0  4.42X Average Risk          9.6          7.13X Average Risk          15.0          11.0                       NonHDL 183.58     Comment: NOTE:  Non-HDL goal should be 30 mg/dL higher than patient's LDL goal (i.e. LDL goal of < 70  mg/dL, would have non-HDL goal of < 100 mg/dL)  Hemoglobin A1c     Status: None   Collection Time: 03/08/16  8:31 AM  Result Value Ref Range   Hgb A1c MFr Bld 5.7 4.6 - 6.5 %    Comment: Glycemic Control Guidelines for People with Diabetes:Non Diabetic:  <6%Goal of Therapy: <7%Additional Action Suggested:  >8%   TSH     Status: None   Collection Time: 03/08/16  8:31 AM  Result Value Ref Range   TSH 1.40 0.35 - 4.50 uIU/mL  Urinalysis, Routine w reflex microscopic (not at [facility])     Status: Abnormal   Collection Time: 03/08/16  8:31 AM  Result Value Ref Range   Color, Urine YELLOW Yellow;Lt. Yellow   APPearance CLEAR Clear   Specific Gravity, Urine 1.015 1.000 - 1.030   pH 6.5 5.0 - 8.0   Total Protein, Urine NEGATIVE Negative   Urine Glucose NEGATIVE Negative   Ketones, ur NEGATIVE Negative   Bilirubin Urine NEGATIVE Negative   Hgb urine dipstick TRACE-INTACT (A) Negative   Urobilinogen, UA 0.2 0.0 - 1.0   Leukocytes, UA TRACE (A) Negative   Nitrite NEGATIVE Negative   WBC, UA 0-2/hpf 0-2/hpf   RBC / HPF 0-2/hpf 0-2/hpf   Squamous Epithelial / LPF Rare(0-4/hpf) Rare(0-4/hpf)  CBC w/Diff     Status: Abnormal   Collection Time: 03/28/16  7:54 AM  Result Value Ref Range   WBC 6.6 4.0 - 10.5 K/uL   RBC 5.30 (H) 3.87 - 5.11 Mil/uL   Hemoglobin 15.7 (H) 12.0 - 15.0 g/dL   HCT 45.9 36.0 - 46.0 %   MCV 86.6 78.0 - 100.0 fl   MCHC 34.2 30.0 - 36.0 g/dL   RDW 13.5 11.5 - 15.5 %   Platelets 223.0 150.0 - 400.0 K/uL   Neutrophils Relative % 63.6 43.0 - 77.0 %   Lymphocytes Relative 25.5 12.0 - 46.0 %   Monocytes Relative 6.9 3.0 - 12.0 %   Eosinophils Relative 3.5 0.0 - 5.0 %   Basophils Relative 0.5 0.0 - 3.0 %   Neutro Abs 4.2 1.4 - 7.7 K/uL   Lymphs Abs 1.7 0.7 - 4.0 K/uL   Monocytes Absolute 0.5 0.1 - 1.0 K/uL   Eosinophils Absolute 0.2 0.0 - 0.7 K/uL   Basophils Absolute 0.0 0.0 - 0.1 K/uL  Ferritin     Status: None   Collection Time: 03/28/16  7:54 AM  Result Value Ref  Range   Ferritin 136.2 10.0 - 291.0 ng/mL  Urinalysis, Routine w reflex microscopic     Status: Abnormal   Collection Time: 03/28/16  7:54 AM  Result Value Ref Range   Color, Urine YELLOW Yellow;Lt. Yellow   APPearance CLEAR Clear   Specific Gravity, Urine <=1.005 (A) 1.000 - 1.030   pH 6.0 5.0 - 8.0   Total Protein, Urine NEGATIVE Negative   Urine Glucose NEGATIVE Negative   Ketones, ur NEGATIVE Negative  Bilirubin Urine NEGATIVE Negative   Hgb urine dipstick NEGATIVE Negative   Urobilinogen, UA 0.2 0.0 - 1.0   Leukocytes, UA TRACE (A) Negative   Nitrite NEGATIVE Negative   WBC, UA 0-2/hpf 0-2/hpf   RBC / HPF none seen 0-2/hpf   Squamous Epithelial / LPF Rare(0-4/hpf) Rare(0-4/hpf)  CULTURE, URINE COMPREHENSIVE     Status: None   Collection Time: 03/28/16  7:54 AM  Result Value Ref Range   Colony Count 1,000-10,000 CFU/mL    Organism ID, Bacteria GROUP B STREP (S.[email or web address]) ISOLATED    Assessment/Plan: 1. Gastroenteritis Suspect viral etiology giving sick contacts at home. Improving. Suspect delay in improvement due to high sugar intake. Discussed BRAT diet. Fiber supplementations. Will check labs today. Stool culture to verify no bacterial infection presence although very low suspicion and improving symptoms. - CBC w/Diff - Basic metabolic panel - Stool Culture  2. Abnormal urine Group B strep noted on prior culture. Asymptomatic overall. Patient notes questionable urgency. Will repeat culture.    Leeanne Rio, PA-C

## 2016-05-15 NOTE — Patient Instructions (Signed)
Please stay well hydrated and follow the diet below. Make sure to get plenty of fiber in the diet to bulk stools. Symptoms should continue to resolve.  Giving prolonged course of symptoms we are checking labs and stool studies. I will call you with your results. Please let me know if symptoms are not continuing to resolve.  Also we will be rechecking urine today to assess for UTI.  I will call once your paperwork is complete.   Food Choices to Help Relieve Diarrhea, Adult When you have diarrhea, the foods you eat and your eating habits are very important. Choosing the right foods and drinks can help relieve diarrhea. Also, because diarrhea can last up to 7 days, you need to replace lost fluids and electrolytes (such as sodium, potassium, and chloride) in order to help prevent dehydration.  WHAT GENERAL GUIDELINES DO I NEED TO FOLLOW?  Slowly drink 1 cup (8 oz) of fluid for each episode of diarrhea. If you are getting enough fluid, your urine will be clear or pale yellow.  Eat starchy foods. Some good choices include white rice, white toast, pasta, low-fiber cereal, baked potatoes (without the skin), saltine crackers, and bagels.  Avoid large servings of any cooked vegetables.  Limit fruit to two servings per day. A serving is  cup or 1 small piece.  Choose foods with less than 2 g of fiber per serving.  Limit fats to less than 8 tsp (38 g) per day.  Avoid fried foods.  Eat foods that have probiotics in them. Probiotics can be found in certain dairy products.  Avoid foods and beverages that may increase the speed at which food moves through the stomach and intestines (gastrointestinal tract). Things to avoid include:  High-fiber foods, such as dried fruit, raw fruits and vegetables, nuts, seeds, and whole grain foods.  Spicy foods and high-fat foods.  Foods and beverages sweetened with high-fructose corn syrup, honey, or sugar alcohols such as xylitol, sorbitol, and  mannitol. WHAT FOODS ARE RECOMMENDED? Grains White rice. White, [place], or pita breads (fresh or toasted), including plain rolls, buns, or bagels. White pasta. Saltine, soda, or graham crackers. Pretzels. Low-fiber cereal. Cooked cereals made with water (such as cornmeal, farina, or cream cereals). Plain muffins. Matzo. Melba toast. Zwieback.  Vegetables Potatoes (without the skin). Strained tomato and vegetable juices. Most well-cooked and canned vegetables without seeds. Tender lettuce. Fruits Cooked or canned applesauce, apricots, cherries, fruit cocktail, grapefruit, peaches, pears, or plums. Fresh bananas, apples without skin, cherries, grapes, cantaloupe, grapefruit, peaches, oranges, or plums.  Meat and Other Protein Products Baked or boiled chicken. Eggs. Tofu. Fish. Seafood. Smooth peanut butter. Ground or well-cooked tender beef, ham, veal, lamb, pork, or poultry.  Dairy Plain yogurt, kefir, and unsweetened liquid yogurt. Lactose-free milk, buttermilk, or soy milk. Plain hard cheese. Beverages Sport drinks. Clear broths. Diluted fruit juices (except prune). Regular, caffeine-free sodas such as ginger ale. Water. Decaffeinated teas. Oral rehydration solutions. Sugar-free beverages not sweetened with sugar alcohols. Other Bouillon, broth, or soups made from recommended foods.  The items listed above may not be a complete list of recommended foods or beverages. Contact your dietitian for more options. WHAT FOODS ARE NOT RECOMMENDED? Grains Whole grain, whole wheat, bran, or rye breads, rolls, pastas, crackers, and cereals. Wild or brown rice. Cereals that contain more than 2 g of fiber per serving. Corn tortillas or taco shells. Cooked or dry oatmeal. Granola. Popcorn. Vegetables Raw vegetables. Cabbage, broccoli, Brussels sprouts, artichokes, baked beans, beet greens,  corn, kale, legumes, peas, sweet potatoes, and yams. Potato skins. Cooked spinach and cabbage. Fruits Dried fruit,  including raisins and dates. Raw fruits. Stewed or dried prunes. Fresh apples with skin, apricots, mangoes, pears, raspberries, and strawberries.  Meat and Other Protein Products Chunky peanut butter. Nuts and seeds. Beans and lentils. Berniece Salines.  Dairy High-fat cheeses. Milk, chocolate milk, and beverages made with milk, such as milk shakes. Cream. Ice cream. Sweets and Desserts Sweet rolls, doughnuts, and sweet breads. Pancakes and waffles. Fats and Oils Butter. Cream sauces. Margarine. Salad oils. Plain salad dressings. Olives. Avocados.  Beverages Caffeinated beverages (such as coffee, tea, soda, or energy drinks). Alcoholic beverages. Fruit juices with pulp. Prune juice. Soft drinks sweetened with high-fructose corn syrup or sugar alcohols. Other Coconut. Hot sauce. Chili powder. Mayonnaise. Gravy. Cream-based or milk-based soups.  The items listed above may not be a complete list of foods and beverages to avoid. Contact your dietitian for more information. WHAT SHOULD I DO IF I BECOME DEHYDRATED? Diarrhea can sometimes lead to dehydration. Signs of dehydration include dark urine and dry mouth and skin. If you think you are dehydrated, you should rehydrate with an oral rehydration solution. These solutions can be purchased at pharmacies, retail stores, or online.  Drink -1 cup (120-240 mL) of oral rehydration solution each time you have an episode of diarrhea. If drinking this amount makes your diarrhea worse, try drinking smaller amounts more often. For example, drink 1-3 tsp (5-15 mL) every 5-10 minutes.  A general rule for staying hydrated is to drink 1-2 L of fluid per day. Talk to your health care provider about the specific amount you should be drinking each day. Drink enough fluids to keep your urine clear or pale yellow.   This information is not intended to replace advice given to you by your health care provider. Make sure you discuss any questions you have with your health care  provider.   Document Released: 09/16/2003 Document Revised: 07/17/2014 Document Reviewed: 05/19/2013 Elsevier Interactive Patient Education [employer].

## 2016-05-15 NOTE — Progress Notes (Signed)
Pre visit review using our clinic review tool, if applicable. No additional management support is needed unless otherwise documented below in the visit note/SLS  

## 2016-05-16 LAB — BASIC METABOLIC PANEL
BUN: 17 mg/dL (ref 6–23)
CALCIUM: 9.5 mg/dL (ref 8.4–10.5)
CHLORIDE: 104 meq/L (ref 96–112)
CO2: 27 mEq/L (ref 19–32)
CREATININE: 0.73 mg/dL (ref 0.40–1.20)
GFR: 85.09 mL/min (ref >60.00)
Glucose, Bld: 84 mg/dL (ref 70–99)
Potassium: 4.1 mEq/L (ref 3.5–5.1)
Sodium: 138 mEq/L (ref 135–145)

## 2016-05-16 LAB — CBC WITH DIFFERENTIAL/PLATELET
BASOS ABS: 0 10*3/uL (ref 0.0–0.1)
BASOS PCT: 0.6 % (ref 0.0–3.0)
Eosinophils Absolute: 0.2 10*3/uL (ref 0.0–0.7)
Eosinophils Relative: 2.8 % (ref 0.0–5.0)
HEMATOCRIT: 45.3 % (ref 36.0–46.0)
Hemoglobin: 15.3 g/dL — ABNORMAL HIGH (ref 12.0–15.0)
LYMPHS ABS: 2.2 10*3/uL (ref 0.7–4.0)
LYMPHS PCT: 29.6 % (ref 12.0–46.0)
MCHC: 33.7 g/dL (ref 30.0–36.0)
MCV: 87.6 fl (ref 78.0–100.0)
MONOS PCT: 5.4 % (ref 3.0–12.0)
Monocytes Absolute: 0.4 10*3/uL (ref 0.1–1.0)
NEUTROS ABS: 4.6 10*3/uL (ref 1.4–7.7)
NEUTROS PCT: 61.6 % (ref 43.0–77.0)
PLATELETS: 232 10*3/uL (ref 150.0–400.0)
RBC: 5.18 Mil/uL — ABNORMAL HIGH (ref 3.87–5.11)
RDW: 12.8 % (ref 11.5–15.5)
WBC: 7.5 10*3/uL (ref 4.0–10.5)

## 2016-05-19 ENCOUNTER — Encounter: Payer: Self-pay | Admitting: Physician Assistant

## 2016-08-29 ENCOUNTER — Encounter: Payer: Self-pay | Admitting: Physician Assistant

## 2016-08-29 ENCOUNTER — Ambulatory Visit (INDEPENDENT_AMBULATORY_CARE_PROVIDER_SITE_OTHER): Payer: Medicare Other | Admitting: Physician Assistant

## 2016-08-29 VITALS — BP 118/72 | HR 73 | Temp 98.3°F | Resp 14 | Ht 70.0 in | Wt 340.0 lb

## 2016-08-29 DIAGNOSIS — G43909 Migraine, unspecified, not intractable, without status migrainosus: Secondary | ICD-10-CM | POA: Insufficient documentation

## 2016-08-29 DIAGNOSIS — K589 Irritable bowel syndrome without diarrhea: Secondary | ICD-10-CM | POA: Insufficient documentation

## 2016-08-29 DIAGNOSIS — J309 Allergic rhinitis, unspecified: Secondary | ICD-10-CM

## 2016-08-29 MED ORDER — LEVOCETIRIZINE DIHYDROCHLORIDE 5 MG PO TABS: 5.0000 mg | ORAL_TABLET | Freq: Every evening | ORAL | 1 refills | Status: DC

## 2016-08-29 NOTE — Patient Instructions (Signed)
Please stay hydrated. Start the Xyzal once daily as directed. Use some saline nasal rinse to flush out nasal passages. Use Delsym if needed for cough.  Let me know if symptoms are not improving over the next 4-5 days, or if anything worsens or new symptoms develop.

## 2016-08-29 NOTE — Progress Notes (Signed)
Pre visit review using our clinic review tool, if applicable. No additional management support is needed unless otherwise documented below in the visit note. 

## 2016-08-29 NOTE — Progress Notes (Signed)
Patient presents to clinic today c/o noted chronic post-nasal drip. Stable until a week ago. Has noted increased drainage with dry cough x 1 week.Denies sinus pressure, sinus pain. Denies ear pressure, sinus pressure/pain, headaches, chest pain, shortness of breath. Denies fever, chills. Has taken [place].  Past Medical History:  Diagnosis Date  . CTS (carpal tunnel syndrome)   . Fatty liver disease, nonalcoholic    In [facility] Study  . IBS (irritable bowel syndrome)    C/D  . Osteoarthritis     Current Outpatient Prescriptions on File Prior to Visit  Medication Sig Dispense Refill  . aspirin 81 MG tablet Take 81 mg by mouth daily.    Marland Kitchen aspirin-acetaminophen-caffeine (EXCEDRIN MIGRAINE) 250-250-65 MG per tablet Take 2 tablets by mouth every 6 (six) hours as needed for headache.    . diphenhydramine-acetaminophen (TYLENOL PM) 25-500 MG TABS Take 2 tablets by mouth at bedtime as needed (Pain & Sleep).    . NON FORMULARY ROLFING: Deep Tissue Massage, 2 Txs weekly as needed for joint pain    . Calcium Carbonate 1500 (600 CA) MG TABS Take 2 tablets by mouth daily.    . Cholecalciferol (VITAMIN D-3) 1000 UNITS CAPS Take 1 each by mouth daily.     No current facility-administered medications on file prior to visit.     Allergies  Allergen Reactions  . Lactose Intolerance (Gi)   . Celebrex [Celecoxib] Swelling and Rash    Family History  Problem Relation Age of Onset  . Stroke Mother 51    Deceased  . Hypertension Mother   . GI problems Mother   . Arthritis Mother   . Dementia Mother   . Heart defect Father 44    Deceased  . Parkinson's disease Father   . Stroke Maternal Grandfather   . Arthritis Maternal Grandfather   . Kidney failure Maternal Grandmother   . Gallbladder disease Maternal Grandmother   . Diverticulitis Sister     #1  . Arthritis Sister   . Healthy Sister     #2    Social History   Social History  . Marital status: Married    Spouse name: N/A  .  Number of children: N/A  . Years of education: N/A   Social History Main Topics  . Smoking status: Never Smoker  . Smokeless tobacco: Never Used  . Alcohol use None  . Drug use: Unknown  . Sexual activity: Not Asked   Other Topics Concern  . None   Social History Narrative  . None   Review of Systems - See HPI.  All other ROS are negative.  BP 118/72   Pulse 73   Temp 98.3 F (36.8 C) (Oral)   Resp 14   Ht 5\' 10"  (1.778 m)   Wt (!) 340 lb (154.2 kg)   SpO2 99%   BMI 48.78 kg/m   Physical Exam  Constitutional: She is oriented to person, place, and time and well-developed, well-nourished, and in no distress.  HENT:  Head: Normocephalic and atraumatic.  Right Ear: External ear normal.  Left Ear: External ear normal.  Nose: No mucosal edema or rhinorrhea. Right sinus exhibits no maxillary sinus tenderness and no frontal sinus tenderness. Left sinus exhibits no maxillary sinus tenderness and no frontal sinus tenderness.  Mouth/Throat: Oropharynx is clear and moist.  Neck: Neck supple.  Cardiovascular: Normal rate, regular rhythm, normal heart sounds and intact distal pulses.   Pulmonary/Chest: Effort normal and breath sounds normal. No respiratory distress.  She has no wheezes. She has no rales. She exhibits no tenderness.  Lymphadenopathy:    She has no cervical adenopathy.  Neurological: She is alert and oriented to person, place, and time.  Skin: Skin is warm and dry.  Psychiatric: Affect normal.  Vitals reviewed.  Assessment/Plan: 1. Chronic allergic rhinitis, unspecified seasonality, unspecified trigger Start Xyzal once daily. Saline nasal rinses. Discussed nasal steroids. Patient declines at present. Supportive measures and OTC medications discussed.    Leeanne Rio, PA-C

## 2016-09-24 ENCOUNTER — Encounter: Payer: Self-pay | Admitting: Physician Assistant

## 2016-09-25 ENCOUNTER — Encounter: Payer: Self-pay | Admitting: Physician Assistant

## 2016-09-26 ENCOUNTER — Encounter: Payer: Self-pay | Admitting: Physician Assistant

## 2016-09-28 ENCOUNTER — Encounter: Payer: Self-pay | Admitting: Physician Assistant

## 2016-10-10 ENCOUNTER — Ambulatory Visit (INDEPENDENT_AMBULATORY_CARE_PROVIDER_SITE_OTHER): Payer: Medicare Other | Admitting: Physician Assistant

## 2016-10-10 ENCOUNTER — Encounter: Payer: Self-pay | Admitting: Physician Assistant

## 2016-10-10 VITALS — BP 118/84 | HR 97 | Temp 101.0°F | Resp 16 | Ht 70.0 in | Wt 341.0 lb

## 2016-10-10 DIAGNOSIS — R6889 Other general symptoms and signs: Secondary | ICD-10-CM

## 2016-10-10 DIAGNOSIS — R509 Fever, unspecified: Secondary | ICD-10-CM

## 2016-10-10 LAB — POCT INFLUENZA A/B
INFLUENZA B, POC: NEGATIVE
Influenza A, POC: NEGATIVE

## 2016-10-10 MED ORDER — OSELTAMIVIR PHOSPHATE 75 MG PO CAPS: 75.0000 mg | ORAL_CAPSULE | Freq: Two times a day (BID) | ORAL | 0 refills | Status: DC

## 2016-10-10 MED ORDER — BENZONATATE 100 MG PO CAPS: 100.0000 mg | ORAL_CAPSULE | Freq: Three times a day (TID) | ORAL | 0 refills | Status: DC | PRN

## 2016-10-10 NOTE — Progress Notes (Signed)
Patient presents to clinic today c/o 1 day of sudden onset fever, chills, aches, sore throat, nasal congestion and dry cough. Denies chest pain, SOB. Denies sinus pressure, sinus pain or ear pain. Denies recent travel. Twin sister whom she lives with has had similar symptoms. 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 He is asymptomatic per patient.   Past Medical History:  Diagnosis Date  . CTS (carpal tunnel syndrome)   . Fatty liver disease, nonalcoholic    In [facility] Study  . IBS (irritable bowel syndrome)    C/D  . Morbid obesity ([place])   . Osteoarthritis     Current Outpatient Prescriptions on File Prior to Visit  Medication Sig Dispense Refill  . aspirin 81 MG tablet Take 81 mg by mouth daily.    Marland Kitchen aspirin-acetaminophen-caffeine (EXCEDRIN MIGRAINE) 250-250-65 MG per tablet Take 2 tablets by mouth every 6 (six) hours as needed for headache.    . diphenhydramine-acetaminophen (TYLENOL PM) 25-500 MG TABS Take 2 tablets by mouth at bedtime as needed (Pain & Sleep).    . NON FORMULARY ROLFING: Deep Tissue Massage, 2 Txs weekly as needed for joint pain    . Calcium Carbonate 1500 (600 CA) MG TABS Take 2 tablets by mouth daily.    . Cholecalciferol (VITAMIN D-3) 1000 UNITS CAPS Take 1 each by mouth daily.     No current facility-administered medications on file prior to visit.     Allergies  Allergen Reactions  . Lactose Intolerance (Gi)   . Celebrex [Celecoxib] Swelling and Rash    Family History  Problem Relation Age of Onset  . Stroke Mother 70    Deceased  . Hypertension Mother   . GI problems Mother   . Arthritis Mother   . Dementia Mother   . Heart defect Father 44    Deceased  . Parkinson's disease Father   . Stroke Maternal Grandfather   . Arthritis Maternal Grandfather   . Kidney failure Maternal Grandmother   . Gallbladder disease Maternal Grandmother   . Diverticulitis Sister     #1  . Arthritis Sister   . Healthy Sister    #2    Social History   Social History  . Marital status: Married    Spouse name: N/A  . Number of children: N/A  . Years of education: N/A   Social History Main Topics  . Smoking status: Never Smoker  . Smokeless tobacco: Never Used  . Alcohol use No  . Drug use: No  . Sexual activity: Not Asked   Other Topics Concern  . None   Social History Narrative  . None   Review of Systems - See HPI.  All other ROS are negative.  BP 118/84   Pulse 97   Temp (!) 101 F (38.3 C) (Oral)   Resp 16   Ht 5\' 10"  (1.778 m)   Wt (!) 341 lb (154.7 kg)   SpO2 95%   BMI 48.93 kg/m   Physical Exam  Constitutional: She is oriented to person, place, and time and well-developed, well-nourished, and in no distress.  HENT:  Head: Normocephalic and atraumatic.  Right Ear: External ear normal.  Left Ear: External ear normal.  Nose: Nose normal.  Mouth/Throat: Oropharynx is clear and moist. No oropharyngeal exudate.  TM within normal limits bilaterally.  Eyes: Conjunctivae are normal.  Neck: Neck supple.  Cardiovascular: Normal rate, regular rhythm, normal heart sounds and intact distal pulses.  Pulmonary/Chest: Effort normal and breath sounds normal. No respiratory distress. She has no wheezes. She has no rales. She exhibits no tenderness.  Lymphadenopathy:    She has no cervical adenopathy.  Neurological: She is alert and oriented to person, place, and time.  Skin: Skin is warm and dry. No rash noted.  Psychiatric: Affect normal.  Vitals reviewed.  Assessment/Plan: 1. Fever, unspecified fever cause 2. Flu-like symptoms Rapid flu test negative but clinical signs/symptoms and history fit influenza. Will start antivirals. Rx Tessalon for cough. Supportive measures and OTC medications reviewed with patient. Increased hydration recommended. Alarm signs/symptoms discussed that would prompt ER assessment.  - oseltamivir (TAMIFLU) 75 MG capsule; Take 1 capsule (75 mg total) by mouth 2 (two)  times daily.  Dispense: 10 capsule; Refill: 0   Leeanne Rio, [place]

## 2016-10-10 NOTE — Progress Notes (Signed)
Pre visit review using our clinic review tool, if applicable. No additional management support is needed unless otherwise documented below in the visit note. 

## 2016-10-10 NOTE — Patient Instructions (Signed)
Please stay well hydrated and get plenty of rest. Tylenol if needed for fever or aches. Use tessalon as directed for cough. Plain Mucinex twice daily (over-the-counter) will help thin any mucous production.  Please take the Tamilfu as directed.   Limit exposure to [facility] when possible to reduce his risk of infection. I will send in a preventive medication for him. If he develops any respiratory symptoms or fever, please have him assessed in the ER.   Influenza, Adult Influenza ("the flu") is an infection in the lungs, nose, and throat (respiratory tract). It is caused by a virus. The flu causes many common cold symptoms, as well as a high fever and body aches. It can make you feel very sick. The flu spreads easily from person to person (is contagious). Getting a flu shot (influenza vaccination) every year is the best way to prevent the flu. Follow these instructions at home:  Take over-the-counter and prescription medicines only as told by your doctor.  Use a cool mist humidifier to add moisture (humidity) to the air in your home. This can make it easier to breathe.  Rest as needed.  Drink enough fluid to keep your pee (urine) clear or pale yellow.  Cover your mouth and nose when you cough or sneeze.  Wash your hands with soap and water often, especially after you cough or sneeze. If you cannot use soap and water, use hand sanitizer.  Stay home from work or school as told by your doctor. Unless you are visiting your doctor, try to avoid leaving home until your fever has been gone for 24 hours without the use of medicine.  Keep all follow-up visits as told by your doctor. This is important. How is this prevented?  Getting a yearly (annual) flu shot is the best way to avoid getting the flu. You may get the flu shot in late summer, fall, or winter. Ask your doctor when you should get your flu shot.  Wash your hands often or use hand sanitizer often.  Avoid contact with people who are  sick during cold and flu season.  Eat healthy foods.  Drink plenty of fluids.  Get enough sleep.  Exercise regularly. Contact a doctor if:  You get new symptoms.  You have:  Chest pain.  Watery poop (diarrhea).  A fever.  Your cough gets worse.  You start to have more mucus.  You feel sick to your stomach (nauseous).  You throw up (vomit). Get help right away if:  You start to be short of breath or have trouble breathing.  Your skin or nails turn a bluish color.  You have very bad pain or stiffness in your neck.  You get a sudden headache.  You get sudden pain in your face or ear.  You cannot stop throwing up. This information is not intended to replace advice given to you by your health care provider. Make sure you discuss any questions you have with your health care provider. Document Released: 04/04/2008 Document Revised: 12/02/2015 Document Reviewed: 04/20/2015 Elsevier Interactive Patient Education  2017 [employer].

## 2016-11-24 ENCOUNTER — Encounter: Payer: Self-pay | Admitting: Physician Assistant

## 2016-11-24 ENCOUNTER — Ambulatory Visit (INDEPENDENT_AMBULATORY_CARE_PROVIDER_SITE_OTHER): Payer: Medicare Other | Admitting: Physician Assistant

## 2016-11-24 VITALS — BP 110/70 | HR 74 | Temp 98.1°F | Resp 16 | Ht 70.0 in | Wt 340.0 lb

## 2016-11-24 DIAGNOSIS — G8929 Other chronic pain: Secondary | ICD-10-CM | POA: Diagnosis not present

## 2016-11-24 DIAGNOSIS — R2681 Unsteadiness on feet: Secondary | ICD-10-CM | POA: Diagnosis not present

## 2016-11-24 DIAGNOSIS — M25552 Pain in left hip: Secondary | ICD-10-CM

## 2016-11-24 NOTE — Progress Notes (Signed)
Pre visit review using our clinic review tool, if applicable. No additional management support is needed unless otherwise documented below in the visit note. 

## 2016-11-24 NOTE — Progress Notes (Signed)
Patient presents to clinic today to discuss referral to physical therapy for chronic left hip pain and gait instability. Has been ongoing issue with multiple prior workups. Diagnosed with OA. Has previously done aquatic physical therapy in 2015. States this helped with symptoms. She has tried to keep up with water aerobics but has had limited success. Has been taking care of her elderly husband and states now she needs to take time for herself.   Past Medical History:  Diagnosis Date  . CTS (carpal tunnel syndrome)   . Fatty liver disease, nonalcoholic    In [facility] Study  . IBS (irritable bowel syndrome)    C/D  . Morbid obesity ([place])   . Osteoarthritis     Current Outpatient Prescriptions on File Prior to Visit  Medication Sig Dispense Refill  . aspirin 81 MG tablet Take 81 mg by mouth daily.    Marland Kitchen aspirin-acetaminophen-caffeine (EXCEDRIN MIGRAINE) 250-250-65 MG per tablet Take 2 tablets by mouth every 6 (six) hours as needed for headache.    . Calcium Carbonate 1500 (600 CA) MG TABS Take 2 tablets by mouth daily.    . Cholecalciferol (VITAMIN D-3) 1000 UNITS CAPS Take 1 each by mouth daily.    . diphenhydramine-acetaminophen (TYLENOL PM) 25-500 MG TABS Take 2 tablets by mouth at bedtime as needed (Pain & Sleep).    . NON FORMULARY ROLFING: Deep Tissue Massage, 2 Txs weekly as needed for joint pain     No current facility-administered medications on file prior to visit.     Allergies  Allergen Reactions  . Lactose Intolerance (Gi)   . Celebrex [Celecoxib] Swelling and Rash    Family History  Problem Relation Age of Onset  . Stroke Mother 99       Deceased  . Hypertension Mother   . GI problems Mother   . Arthritis Mother   . Dementia Mother   . Heart defect Father 89       Deceased  . Parkinson's disease Father   . Stroke Maternal Grandfather   . Arthritis Maternal Grandfather   . Kidney failure Maternal Grandmother   . Gallbladder disease Maternal Grandmother   .  Diverticulitis Sister        #1  . Arthritis Sister   . Healthy Sister        #2    Social History   Social History  . Marital status: Married    Spouse name: N/A  . Number of children: N/A  . Years of education: N/A   Social History Main Topics  . Smoking status: Never Smoker  . Smokeless tobacco: Never Used  . Alcohol use No  . Drug use: No  . Sexual activity: Not Asked   Other Topics Concern  . None   Social History Narrative  . None   Review of Systems - See HPI.  All other ROS are negative.  BP 110/70   Pulse 74   Temp 98.1 F (36.7 C) (Oral)   Resp 16   Ht 5\' 10"  (1.778 m)   Wt (!) 340 lb (154.2 kg)   SpO2 97%   BMI 48.78 kg/m   Physical Exam  Constitutional: She is oriented to person, place, and time and well-developed, well-nourished, and in no distress.  HENT:  Head: Normocephalic and atraumatic.  Eyes: Conjunctivae are normal.  Neck: Neck supple.  Cardiovascular: Normal rate, regular rhythm, normal heart sounds and intact distal pulses.   Pulmonary/Chest: Effort normal and breath sounds normal. No  respiratory distress. She has no wheezes.  Neurological: She is alert and oriented to person, place, and time.  Skin: Skin is warm and dry.  Psychiatric: Affect normal.  Vitals reviewed.   Recent Results (from the past 2160 hour(s))  POCT Influenza A/B     Status: Normal   Collection Time: 10/10/16 11:21 AM  Result Value Ref Range   Influenza A, POC Negative Negative   Influenza B, POC Negative Negative    Assessment/Plan: 1. Chronic left hip pain 2. Gait instability Referral to [facility] placed for evaluation and management. Discussed follow-up. Declines medication. - Ambulatory referral to Physical Therapy   Leeanne Rio, PA-C

## 2016-11-24 NOTE — Patient Instructions (Signed)
I will be setting you up with physical therapy. If you do not hear from them by mid next week, call me or Mychart message me.   I will let you know when the catheter supply company has contacted me.  Remember to reach out to Dr. Ree Edman as discussed.  Hang in there!

## 2017-01-01 ENCOUNTER — Other Ambulatory Visit: Payer: Self-pay | Admitting: Physician Assistant

## 2017-01-01 ENCOUNTER — Other Ambulatory Visit: Payer: Medicare Other

## 2017-01-01 DIAGNOSIS — R300 Dysuria: Secondary | ICD-10-CM

## 2017-01-02 ENCOUNTER — Other Ambulatory Visit (INDEPENDENT_AMBULATORY_CARE_PROVIDER_SITE_OTHER): Payer: Medicare Other

## 2017-01-02 DIAGNOSIS — R300 Dysuria: Secondary | ICD-10-CM

## 2017-01-02 LAB — URINALYSIS, ROUTINE W REFLEX MICROSCOPIC
Bilirubin Urine: NEGATIVE
Hgb urine dipstick: NEGATIVE
Ketones, ur: NEGATIVE
Leukocytes, UA: NEGATIVE
NITRITE: NEGATIVE
PH: 5.5 (ref 5.0–8.0)
Total Protein, Urine: NEGATIVE
Urine Glucose: NEGATIVE
Urobilinogen, UA: 0.2 (ref 0.0–1.0)

## 2017-01-03 ENCOUNTER — Encounter: Payer: Self-pay | Admitting: Physician Assistant

## 2017-01-03 LAB — URINE CULTURE: ORGANISM ID, BACTERIA: NO GROWTH

## 2017-01-23 DIAGNOSIS — K7581 Nonalcoholic steatohepatitis (NASH): Secondary | ICD-10-CM | POA: Diagnosis not present

## 2017-01-23 DIAGNOSIS — Z6841 Body Mass Index (BMI) 40.0 and over, adult: Secondary | ICD-10-CM | POA: Diagnosis not present

## 2017-01-29 ENCOUNTER — Ambulatory Visit (INDEPENDENT_AMBULATORY_CARE_PROVIDER_SITE_OTHER): Payer: Medicare Other | Admitting: Physician Assistant

## 2017-01-29 ENCOUNTER — Encounter: Payer: Self-pay | Admitting: Physician Assistant

## 2017-01-29 VITALS — BP 112/80 | HR 81 | Temp 98.4°F | Resp 16 | Ht 70.0 in | Wt 342.0 lb

## 2017-01-29 DIAGNOSIS — B9789 Other viral agents as the cause of diseases classified elsewhere: Secondary | ICD-10-CM | POA: Diagnosis not present

## 2017-01-29 DIAGNOSIS — J329 Chronic sinusitis, unspecified: Secondary | ICD-10-CM

## 2017-01-29 MED ORDER — FLUTICASONE PROPIONATE 50 MCG/ACT NA SUSP: 2.0000 | Freq: Every day | NASAL | 1 refills | Status: DC

## 2017-01-29 NOTE — Progress Notes (Signed)
Patient presents to clinic today c/o postnasal drip, rhinorrhea and sinus pressure x 1.5 days. Denies fever or chills. Has noted some mild throat congestion 2/2 pnd. Denies chest pain or shortness of breath. Does feel winded when she has a lot on drainage in her throat. Denies recent travel.  Past Medical History:  Diagnosis Date  . CTS (carpal tunnel syndrome)   . Fatty liver disease, nonalcoholic    In [facility] Study  . IBS (irritable bowel syndrome)    C/D  . Morbid obesity ([place])   . Osteoarthritis     Current Outpatient Prescriptions on File Prior to Visit  Medication Sig Dispense Refill  . aspirin 81 MG tablet Take 81 mg by mouth daily.    Marland Kitchen aspirin-acetaminophen-caffeine (EXCEDRIN MIGRAINE) 250-250-65 MG per tablet Take 2 tablets by mouth every 6 (six) hours as needed for headache.    . Calcium Carbonate 1500 (600 CA) MG TABS Take 2 tablets by mouth daily.    . Cholecalciferol (VITAMIN D-3) 1000 UNITS CAPS Take 1 each by mouth daily.    . diphenhydramine-acetaminophen (TYLENOL PM) 25-500 MG TABS Take 2 tablets by mouth at bedtime as needed (Pain & Sleep).    . NON FORMULARY ROLFING: Deep Tissue Massage, 2 Txs weekly as needed for joint pain     No current facility-administered medications on file prior to visit.     Allergies  Allergen Reactions  . Lactose Intolerance (Gi)   . Celebrex [Celecoxib] Swelling and Rash    Family History  Problem Relation Age of Onset  . Stroke Mother 53       Deceased  . Hypertension Mother   . GI problems Mother   . Arthritis Mother   . Dementia Mother   . Heart defect Father 55       Deceased  . Parkinson's disease Father   . Stroke Maternal Grandfather   . Arthritis Maternal Grandfather   . Kidney failure Maternal Grandmother   . Gallbladder disease Maternal Grandmother   . Diverticulitis Sister        #1  . Arthritis Sister   . Healthy Sister        #2    Social History   Social History  . Marital status: Married   Spouse name: N/A  . Number of children: N/A  . Years of education: N/A   Social History Main Topics  . Smoking status: Never Smoker  . Smokeless tobacco: Never Used  . Alcohol use No  . Drug use: No  . Sexual activity: Not Asked   Other Topics Concern  . None   Social History Narrative  . None   Review of Systems - See HPI.  All other ROS are negative.  BP 112/80   Pulse 81   Temp 98.4 F (36.9 C) (Oral)   Resp 16   Ht 5\' 10"  (1.778 m)   Wt (!) 342 lb (155.1 kg)   SpO2 95%   BMI 49.07 kg/m   Physical Exam  Constitutional: She is oriented to person, place, and time and well-developed, well-nourished, and in no distress.  HENT:  Head: Normocephalic and atraumatic.  Right Ear: Tympanic membrane normal.  Left Ear: Tympanic membrane normal.  Nose: Mucosal edema and rhinorrhea present. Right sinus exhibits no maxillary sinus tenderness and no frontal sinus tenderness. Left sinus exhibits no maxillary sinus tenderness and no frontal sinus tenderness.  Mouth/Throat: Uvula is midline, oropharynx is clear and moist and mucous membranes are normal.  Neck:  Neck supple.  Cardiovascular: Normal rate, regular rhythm, normal heart sounds and intact distal pulses.   Pulmonary/Chest: Effort normal and breath sounds normal. No respiratory distress. She has no wheezes. She has no rales. She exhibits no tenderness.  Lymphadenopathy:    She has no cervical adenopathy.  Neurological: She is alert and oriented to person, place, and time.  Skin: Skin is warm and dry. No rash noted.  Psychiatric: Affect normal.  Vitals reviewed.  Recent Results (from the past 2160 hour(s))  Urinalysis, Routine w reflex microscopic     Status: Abnormal   Collection Time: 01/02/17  7:19 AM  Result Value Ref Range   Color, Urine YELLOW Yellow;Lt. Yellow   APPearance CLEAR Clear   Specific Gravity, Urine <=1.005 (A) 1.000 - 1.030   pH 5.5 5.0 - 8.0   Total Protein, Urine NEGATIVE Negative   Urine Glucose  NEGATIVE Negative   Ketones, ur NEGATIVE Negative   Bilirubin Urine NEGATIVE Negative   Hgb urine dipstick NEGATIVE Negative   Urobilinogen, UA 0.2 0.0 - 1.0   Leukocytes, UA NEGATIVE Negative   Nitrite NEGATIVE Negative   Squamous Epithelial / LPF Rare(0-4/hpf) Rare(0-4/hpf)  Urine Culture     Status: None   Collection Time: 01/02/17  7:19 AM  Result Value Ref Range   Organism ID, Bacteria NO GROWTH    Assessment/Plan: 1. Viral sinusitis Start Flonase, zinc and echinacea. Supportive measures and OTC medications reviewed. Return precautions discussed with patient.    Leeanne Rio, PA-C

## 2017-01-29 NOTE — Progress Notes (Signed)
Pre visit review using our clinic review tool, if applicable. No additional management support is needed unless otherwise documented below in the visit note. 

## 2017-01-29 NOTE — Patient Instructions (Signed)

## 2017-02-27 ENCOUNTER — Other Ambulatory Visit: Payer: Self-pay | Admitting: Physician Assistant

## 2017-02-27 DIAGNOSIS — Z1231 Encounter for screening mammogram for malignant neoplasm of breast: Secondary | ICD-10-CM

## 2017-03-29 ENCOUNTER — Ambulatory Visit (HOSPITAL_BASED_OUTPATIENT_CLINIC_OR_DEPARTMENT_OTHER): Payer: Medicare Other

## 2017-04-05 ENCOUNTER — Ambulatory Visit (HOSPITAL_BASED_OUTPATIENT_CLINIC_OR_DEPARTMENT_OTHER)
Admission: RE | Admit: 2017-04-05 | Discharge: 2017-04-05 | Disposition: A | Discharge status: Home / Self Care | Payer: Medicare Other | Source: Ambulatory Visit | Attending: Physician Assistant | Admitting: Physician Assistant

## 2017-04-05 DIAGNOSIS — Z1231 Encounter for screening mammogram for malignant neoplasm of breast: Secondary | ICD-10-CM | POA: Insufficient documentation

## 2017-04-06 ENCOUNTER — Encounter: Payer: Self-pay | Admitting: Emergency Medicine

## 2017-06-06 NOTE — Progress Notes (Signed)
Subjective:   Leslie Knapp is a 66 y.o. female who presents for Medicare Annual (Subsequent) preventive examination.  Review of Systems:  No ROS.  Medicare Wellness Visit. Additional risk factors are reflected in the social history.  Cardiac Risk Factors include: advanced age (>78men, >49 women);obesity (BMI >30kg/m2);family history of premature cardiovascular disease   Sleep patterns: Sleeps 6-8 hours.  Home Safety/Smoke Alarms: Feels safe in home. Smoke alarms in place.  Living environment; residence and Firearm Safety: Lives with husband and sister in 1 story home.  Seat Belt Safety/Bike Helmet: Wears seat belt.   Female:   Pap-01/08/2015       Mammo-04/05/2017, negative.     Dexa scan-Last > 3 years. Plans to have with mammogram in 2019.         CCS-Colonoscopy 07/10/2009, pt reports normal.      Objective:     Vitals: BP 122/78 (BP Location: Left Arm, Patient Position: Sitting, Cuff Size: Large)   Pulse 72   Temp 98.2 F (36.8 C) (Temporal)   Resp 18   Ht 5\' 10"  (1.778 m)   Wt (!) 326 lb 12.8 oz (148.2 kg)   SpO2 99%   BMI 46.89 kg/m   Body mass index is 46.89 kg/m.   Tobacco Social History   Tobacco Use  Smoking Status Never Smoker  Smokeless Tobacco Never Used     Counseling given: Not Answered   Past Medical History:  Diagnosis Date  . CTS (carpal tunnel syndrome)   . Fatty liver disease, nonalcoholic    In [facility] Study  . IBS (irritable bowel syndrome)    C/D  . Morbid obesity ([place])   . Osteoarthritis    Past Surgical History:  Procedure Laterality Date  . KNEE SURGERY  1975   Left Patella Tendon Replacement  . TONSILLECTOMY  1963  . 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 TOTAL KNEE ARTHROPLASTY  11.2005   Right  . UTERINE POLYP REMOVAL  2010   Family History  Problem Relation Age of Onset  . Stroke Mother 45       Deceased  . Hypertension Mother   . GI problems Mother   . Arthritis Mother   . Dementia Mother   . Heart  defect Father 46       Deceased  . Parkinson's disease Father   . Stroke Maternal Grandfather   . Arthritis Maternal Grandfather   . Kidney failure Maternal Grandmother   . Gallbladder disease Maternal Grandmother   . Diverticulitis Sister        #1  . Arthritis Sister   . Healthy Sister        #2   Social History   Substance and Sexual Activity  Sexual Activity Not on file    Outpatient Encounter Medications as of 06/07/2017  Medication Sig  . aspirin 81 MG tablet Take 81 mg by mouth daily.  Marland Kitchen aspirin-acetaminophen-caffeine (EXCEDRIN MIGRAINE) 250-250-65 MG per tablet Take 2 tablets by mouth every 6 (six) hours as needed for headache.  . diphenhydramine-acetaminophen (TYLENOL PM) 25-500 MG TABS Take 2 tablets by mouth at bedtime as needed (Pain & Sleep).  . NON FORMULARY ROLFING: Deep Tissue Massage, 2 Txs weekly as needed for joint pain  . Calcium Carbonate 1500 (600 CA) MG TABS Take 2 tablets by mouth daily.  . Cholecalciferol (VITAMIN D-3) 1000 UNITS CAPS Take 1 each by mouth daily.  . [DISCONTINUED] fluticasone (FLONASE) 50 MCG/ACT nasal spray Place 2 sprays into  both nostrils daily.   No facility-administered encounter medications on file as of 06/07/2017.     Activities of Daily Living In your present state of health, do you have any difficulty performing the following activities: 06/07/2017 01/29/2017  Hearing? N N  Vision? N N  Difficulty concentrating or making decisions? N N  Walking or climbing stairs? Y N  Comment pain in back -  Dressing or bathing? N N  Doing errands, shopping? N N  Preparing Food and eating ? N -  Using the Toilet? N -  In the past six months, have you accidently leaked urine? N -  Do you have problems with loss of bowel control? N -  Managing your Medications? N -  Managing your Finances? N -  Housekeeping or managing your Housekeeping? N -  Some recent data might be hidden    Patient Care Team: Delorse Limber as PCP -  General (Physician Assistant) Shiela Mayer, MD as Referring Physician (Gastroenterology) Sheryn Bison, MD as Referring Physician (Dermatology) Avie Echevaria., MD as Referring Physician (Sports Medicine) Shearon Balo, DC (Chiropractic Medicine) Calvert Cantor, MD as Consulting Physician (Ophthalmology)    Assessment:    Physical assessment deferred to PCP.  Exercise Activities and Dietary recommendations Current Exercise Habits: The patient does not participate in regular exercise at present, Exercise limited by: orthopedic condition(s)   Diet (meal preparation, eat out, water intake, caffeinated beverages, dairy products, fruits and vegetables): Drinks Glucerna and water.   Breakfast: Eggs, fruit Lunch: Glucerna shake; sandwich Dinner: Fruit smoothie; protein and vegetables.   Goals    . Weight (lb) < 275 lb (124.7 kg)     Lose weight by dieting.       Fall Risk Fall Risk  06/07/2017 03/08/2016  Falls in the past year? No No   Depression Screen PHQ 2/9 Scores 06/07/2017 01/29/2017 03/08/2016  PHQ - 2 Score 0 1 0  PHQ- 9 Score - 3 0     Cognitive Function MMSE - Mini Mental State Exam 06/07/2017  Orientation to time 5  Orientation to Place 5  Registration 3  Attention/ Calculation 5  Recall 3  Language- name 2 objects 2  Language- repeat 1  Language- follow 3 step command 3  Language- read & follow direction 1  Write a sentence 1  Copy design 1  Total score 30        Immunization History  Administered Date(s) Administered  . Influenza,inj,Quad PF,6+ Mos 04/07/2016   Screening Tests Health Maintenance  Topic Date Due  . Hepatitis C Screening  06/20/2017 (Originally 1950-08-04)  . HIV Screening  06/20/2017 (Originally 07/27/1965)  . PNA vac Low Risk Adult (1 of 2 - PCV13) 09/07/2017 (Originally 07/27/2016)  . INFLUENZA VACCINE  10/31/2017 (Originally 02/07/2017)  . DEXA SCAN  11/07/2017 (Originally 07/27/2016)  . TETANUS/TDAP  06/07/2018 (Originally  07/27/1970)  . DTaP/Tdap/Td (1 - Tdap) 06/09/2018 (Originally 07/27/1970)  . PAP SMEAR  01/07/2018  . MAMMOGRAM  04/06/2019  . COLONOSCOPY  07/11/2019      Plan:    Schedule eye appointment.   Let us know about Pneumonia vaccine and Shingles vaccine (Shingrix).   Continue doing brain stimulating activities (puzzles, reading, adult coloring books, staying active) to keep memory sharp.    I have personally reviewed and noted the following in the patient's chart:   . Medical and social history . Use of alcohol, tobacco or illicit drugs  . Current medications and supplements . Functional ability and  status . Nutritional status . Physical activity . Advanced directives . List of other physicians . Hospitalizations, surgeries, and ER visits in previous 12 months . Vitals . Screenings to include cognitive, depression, and falls . Referrals and appointments  In addition, I have reviewed and discussed with patient certain preventive protocols, quality metrics, and best practice recommendations. A written personalized care plan for preventive services as well as general preventive health recommendations were provided to patient.     Gerilyn Nestle, RN  06/07/2017

## 2017-06-07 ENCOUNTER — Ambulatory Visit: Payer: Medicare Other

## 2017-06-07 ENCOUNTER — Ambulatory Visit (INDEPENDENT_AMBULATORY_CARE_PROVIDER_SITE_OTHER): Payer: Medicare Other | Admitting: Physician Assistant

## 2017-06-07 ENCOUNTER — Ambulatory Visit: Payer: Medicare Other | Admitting: Physician Assistant

## 2017-06-07 ENCOUNTER — Other Ambulatory Visit: Payer: Self-pay

## 2017-06-07 ENCOUNTER — Encounter: Payer: Self-pay | Admitting: Physician Assistant

## 2017-06-07 VITALS — BP 122/78 | HR 72 | Temp 98.2°F | Resp 18 | Ht 70.0 in | Wt 326.8 lb

## 2017-06-07 DIAGNOSIS — E559 Vitamin D deficiency, unspecified: Secondary | ICD-10-CM | POA: Diagnosis not present

## 2017-06-07 DIAGNOSIS — Z23 Encounter for immunization: Secondary | ICD-10-CM

## 2017-06-07 DIAGNOSIS — M799 Soft tissue disorder, unspecified: Secondary | ICD-10-CM

## 2017-06-07 DIAGNOSIS — Z0000 Encounter for general adult medical examination without abnormal findings: Secondary | ICD-10-CM

## 2017-06-07 DIAGNOSIS — K7581 Nonalcoholic steatohepatitis (NASH): Secondary | ICD-10-CM | POA: Diagnosis not present

## 2017-06-07 LAB — COMPREHENSIVE METABOLIC PANEL
ALT: 16 U/L (ref 0–35)
AST: 20 U/L (ref 0–37)
Albumin: 4.3 g/dL (ref 3.5–5.2)
Alkaline Phosphatase: 88 U/L (ref 39–117)
BILIRUBIN TOTAL: 0.8 mg/dL (ref 0.2–1.2)
BUN: 13 mg/dL (ref 6–23)
CALCIUM: 9.8 mg/dL (ref 8.4–10.5)
CO2: 32 meq/L (ref 19–32)
Chloride: 102 mEq/L (ref 96–112)
Creatinine, Ser: 0.65 mg/dL (ref 0.40–1.20)
GFR: 96.97 mL/min (ref >60.00)
Glucose, Bld: 91 mg/dL (ref 70–99)
POTASSIUM: 4.5 meq/L (ref 3.5–5.1)
Sodium: 141 mEq/L (ref 135–145)
Total Protein: 7 g/dL (ref 6.0–8.3)

## 2017-06-07 LAB — LIPID PANEL
CHOL/HDL RATIO: 4
Cholesterol: 251 mg/dL — ABNORMAL HIGH (ref 0–200)
HDL: 56.7 mg/dL (ref >39.00)
LDL Cholesterol: 165 mg/dL — ABNORMAL HIGH (ref 0–99)
NonHDL: 194.11
TRIGLYCERIDES: 144 mg/dL (ref 0.0–149.0)
VLDL: 28.8 mg/dL (ref 0.0–40.0)

## 2017-06-07 LAB — VITAMIN D 25 HYDROXY (VIT D DEFICIENCY, FRACTURES): VITD: 18.74 ng/mL — ABNORMAL LOW (ref 30.00–100.00)

## 2017-06-07 MED ORDER — ZOSTER VAC RECOMB ADJUVANTED 50 MCG/0.5ML IM SUSR: 0.5000 mL | Freq: Once | INTRAMUSCULAR | 0 refills | Status: AC

## 2017-06-07 NOTE — Patient Instructions (Addendum)
Please go to the lab today for blood work.  I will call you with your results. We will alter treatment regimen(s) if indicated by your results.   Check with medicare for coverage of Shingrix. I have given you a prescription to get at pharmacy if you decide to.  Schedule eye appointment.   Continue doing brain stimulating activities (puzzles, reading, adult coloring books, staying active) to keep memory sharp.   Health Maintenance, Female Adopting a healthy lifestyle and getting preventive care can go a long way to promote health and wellness. Talk with your health care provider about what schedule of regular examinations is right for you. This is a good chance for you to check in with your provider about disease prevention and staying healthy. In between checkups, there are plenty of things you can do on your own. Experts have done a lot of research about which lifestyle changes and preventive measures are most likely to keep you healthy. Ask your health care provider for more information. Weight and diet Eat a healthy diet  Be sure to include plenty of vegetables, fruits, low-fat dairy products, and lean protein.  Do not eat a lot of foods high in solid fats, added sugars, or salt.  Get regular exercise. This is one of the most important things you can do for your health. ? Most adults should exercise for at least 150 minutes each week. The exercise should increase your heart rate and make you sweat (moderate-intensity exercise). ? Most adults should also do strengthening exercises at least twice a week. This is in addition to the moderate-intensity exercise.  Maintain a healthy weight  Body mass index (BMI) is a measurement that can be used to identify possible weight problems. It estimates body fat based on height and weight. Your health care provider can help determine your BMI and help you achieve or maintain a healthy weight.  For females 40 years of age and older: ? A BMI below  18.5 is considered underweight. ? A BMI of 18.5 to 24.9 is normal. ? A BMI of 25 to 29.9 is considered overweight. ? A BMI of 30 and above is considered obese.  Watch levels of cholesterol and blood lipids  You should start having your blood tested for lipids and cholesterol at 65 years of age, then have this test every 5 years.  You may need to have your cholesterol levels checked more often if: ? Your lipid or cholesterol levels are high. ? You are older than 65 years of age. ? You are at high risk for heart disease.  Cancer screening Lung Cancer  Lung cancer screening is recommended for adults 72-8 years old who are at high risk for lung cancer because of a history of smoking.  A yearly low-dose CT scan of the lungs is recommended for people who: ? Currently smoke. ? Have quit within the past 15 years. ? Have at least a 30-pack-year history of smoking. A pack year is smoking an average of one pack of cigarettes a day for 1 year.  Yearly screening should continue until it has been 15 years since you quit.  Yearly screening should stop if you develop a health problem that would prevent you from having lung cancer treatment.  Breast Cancer  Practice breast self-awareness. This means understanding how your breasts normally appear and feel.  It also means doing regular breast self-exams. Let your health care provider know about any changes, no matter how small.  If you are in  your 20s or 30s, you should have a clinical breast exam (CBE) by a health care provider every 1-3 years as part of a regular health exam.  If you are 2 or older, have a CBE every year. Also consider having a breast X-ray (mammogram) every year.  If you have a family history of breast cancer, talk to your health care provider about genetic screening.  If you are at high risk for breast cancer, talk to your health care provider about having an MRI and a mammogram every year.  Breast cancer gene (BRCA)  assessment is recommended for women who have family members with BRCA-related cancers. BRCA-related cancers include: ? Breast. ? Ovarian. ? Tubal. ? Peritoneal cancers.  Results of the assessment will determine the need for genetic counseling and BRCA1 and BRCA2 testing.  Cervical Cancer Your health care provider may recommend that you be screened regularly for cancer of the pelvic organs (ovaries, uterus, and vagina). This screening involves a pelvic examination, including checking for microscopic changes to the surface of your cervix (Pap test). You may be encouraged to have this screening done every 3 years, beginning at age 78.  For women ages 58-65, health care providers may recommend pelvic exams and Pap testing every 3 years, or they may recommend the Pap and pelvic exam, combined with testing for human papilloma virus (HPV), every 5 years. Some types of HPV increase your risk of cervical cancer. Testing for HPV may also be done on women of any age with unclear Pap test results.  Other health care providers may not recommend any screening for nonpregnant women who are considered low risk for pelvic cancer and who do not have symptoms. Ask your health care provider if a screening pelvic exam is right for you.  If you have had past treatment for cervical cancer or a condition that could lead to cancer, you need Pap tests and screening for cancer for at least 20 years after your treatment. If Pap tests have been discontinued, your risk factors (such as having a new sexual partner) need to be reassessed to determine if screening should resume. Some women have medical problems that increase the chance of getting cervical cancer. In these cases, your health care provider may recommend more frequent screening and Pap tests.  Colorectal Cancer  This type of cancer can be detected and often prevented.  Routine colorectal cancer screening usually begins at 65 years of age and continues through 65  years of age.  Your health care provider may recommend screening at an earlier age if you have risk factors for colon cancer.  Your health care provider may also recommend using home test kits to check for hidden blood in the stool.  A small camera at the end of a tube can be used to examine your colon directly (sigmoidoscopy or colonoscopy). This is done to check for the earliest forms of colorectal cancer.  Routine screening usually begins at age 40.  Direct examination of the colon should be repeated every 5-10 years through 65 years of age. However, you may need to be screened more often if early forms of precancerous polyps or small growths are found.  Skin Cancer  Check your skin from head to toe regularly.  Tell your health care provider about any new moles or changes in moles, especially if there is a change in a mole's shape or color.  Also tell your health care provider if you have a mole that is larger than the size  of a pencil eraser.  Always use sunscreen. Apply sunscreen liberally and repeatedly throughout the day.  Protect yourself by wearing long sleeves, pants, a wide-brimmed hat, and sunglasses whenever you are outside.  Heart disease, diabetes, and high blood pressure  High blood pressure causes heart disease and increases the risk of stroke. High blood pressure is more likely to develop in: ? People who have blood pressure in the high end of the normal range (130-139/85-89 mm Hg). ? People who are overweight or obese. ? People who are African American.  If you are 38-70 years of age, have your blood pressure checked every 3-5 years. If you are 1 years of age or older, have your blood pressure checked every year. You should have your blood pressure measured twice-once when you are at a hospital or clinic, and once when you are not at a hospital or clinic. Record the average of the two measurements. To check your blood pressure when you are not at a hospital or  clinic, you can use: ? An automated blood pressure machine at a pharmacy. ? A home blood pressure monitor.  If you are between 15 years and 24 years old, ask your health care provider if you should take aspirin to prevent strokes.  Have regular diabetes screenings. This involves taking a blood sample to check your fasting blood sugar level. ? If you are at a normal weight and have a low risk for diabetes, have this test once every three years after 66 years of age. ? If you are overweight and have a high risk for diabetes, consider being tested at a younger age or more often. Preventing infection Hepatitis B  If you have a higher risk for hepatitis B, you should be screened for this virus. You are considered at high risk for hepatitis B if: ? You were born in a country where hepatitis B is common. Ask your health care provider which countries are considered high risk. ? Your parents were born in a high-risk country, and you have not been immunized against hepatitis B (hepatitis B vaccine). ? You have HIV or AIDS. ? You use needles to inject street drugs. ? You live with someone who has hepatitis B. ? You have had sex with someone who has hepatitis B. ? You get hemodialysis treatment. ? You take certain medicines for conditions, including cancer, organ transplantation, and autoimmune conditions.  Hepatitis C  Blood testing is recommended for: ? Everyone born from [birth year] through 1964. ? Anyone with known risk factors for hepatitis C.  Sexually transmitted infections (STIs)  You should be screened for sexually transmitted infections (STIs) including gonorrhea and chlamydia if: ? You are sexually active and are younger than 65 years of age. ? You are older than 65 years of age and your health care provider tells you that you are at risk for this type of infection. ? Your sexual activity has changed since you were last screened and you are at an increased risk for chlamydia or gonorrhea.  Ask your health care provider if you are at risk.  If you do not have HIV, but are at risk, it may be recommended that you take a prescription medicine daily to prevent HIV infection. This is called pre-exposure prophylaxis (PrEP). You are considered at risk if: ? You are sexually active and do not regularly use condoms or know the HIV status of your partner(s). ? You take drugs by injection. ? You are sexually active with a partner who  has HIV.  Talk with your health care provider about whether you are at high risk of being infected with HIV. If you choose to begin PrEP, you should first be tested for HIV. You should then be tested every 3 months for as long as you are taking PrEP. Pregnancy  If you are premenopausal and you may become pregnant, ask your health care provider about preconception counseling.  If you may become pregnant, take 400 to 800 micrograms (mcg) of folic acid every day.  If you want to prevent pregnancy, talk to your health care provider about birth control (contraception). Osteoporosis and menopause  Osteoporosis is a disease in which the bones lose minerals and strength with aging. This can result in serious bone fractures. Your risk for osteoporosis can be identified using a bone density scan.  If you are 74 years of age or older, or if you are at risk for osteoporosis and fractures, ask your health care provider if you should be screened.  Ask your health care provider whether you should take a calcium or vitamin D supplement to lower your risk for osteoporosis.  Menopause may have certain physical symptoms and risks.  Hormone replacement therapy may reduce some of these symptoms and risks. Talk to your health care provider about whether hormone replacement therapy is right for you. Follow these instructions at home:  Schedule regular health, dental, and eye exams.  Stay current with your immunizations.  Do not use any tobacco products including cigarettes,  chewing tobacco, or electronic cigarettes.  If you are pregnant, do not drink alcohol.  If you are breastfeeding, limit how much and how often you drink alcohol.  Limit alcohol intake to no more than 1 drink per day for nonpregnant women. One drink equals 12 ounces of beer, 5 ounces of wine, or 1 ounces of hard liquor.  Do not use street drugs.  Do not share needles.  Ask your health care provider for help if you need support or information about quitting drugs.  Tell your health care provider if you often feel depressed.  Tell your health care provider if you have ever been abused or do not feel safe at home. This information is not intended to replace advice given to you by your health care provider. Make sure you discuss any questions you have with your health care provider. Document Released: 01/09/2011 Document Revised: 12/02/2015 Document Reviewed: 03/30/2015 Elsevier Interactive Patient Education  [employer].

## 2017-06-07 NOTE — Progress Notes (Signed)
Patient presents to clinic today for follow-up after her AWV with our [place]. RN AWV reviewed. No concerns noted. Patient declines flu and pneumonia shots during AWV but would like to discuss further.   Patient denies acute concerns today. Is needing updating prescription for her rolfing massages.  Past Medical History:  Diagnosis Date  . CTS (carpal tunnel syndrome)   . Fatty liver disease, nonalcoholic    In [facility] Study  . IBS (irritable bowel syndrome)    C/D  . Morbid obesity ([place])   . Osteoarthritis     Current Outpatient Medications on File Prior to Visit  Medication Sig Dispense Refill  . aspirin 81 MG tablet Take 81 mg by mouth daily.    Marland Kitchen aspirin-acetaminophen-caffeine (EXCEDRIN MIGRAINE) 250-250-65 MG per tablet Take 2 tablets by mouth every 6 (six) hours as needed for headache.    . diphenhydramine-acetaminophen (TYLENOL PM) 25-500 MG TABS Take 2 tablets by mouth at bedtime as needed (Pain & Sleep).    . NON FORMULARY ROLFING: Deep Tissue Massage, 2 Txs weekly as needed for joint pain    . Calcium Carbonate 1500 (600 CA) MG TABS Take 2 tablets by mouth daily.    . Cholecalciferol (VITAMIN D-3) 1000 UNITS CAPS Take 1 each by mouth daily.     No current facility-administered medications on file prior to visit.     Allergies  Allergen Reactions  . Lactose Intolerance (Gi)   . Celebrex [Celecoxib] Swelling and Rash    Family History  Problem Relation Age of Onset  . Stroke Mother 64       Deceased  . Hypertension Mother   . GI problems Mother   . Arthritis Mother   . Dementia Mother   . Heart defect Father 31       Deceased  . Parkinson's disease Father   . Stroke Maternal Grandfather   . Arthritis Maternal Grandfather   . Kidney failure Maternal Grandmother   . Gallbladder disease Maternal Grandmother   . Diverticulitis Sister        #1  . Arthritis Sister   . Healthy Sister        #2    Social History   Socioeconomic History  . Marital  status: Married    Spouse name: None  . Number of children: None  . Years of education: None  . Highest education level: None  Social Needs  . Financial resource strain: None  . Food insecurity - worry: None  . Food insecurity - inability: None  . Transportation needs - medical: None  . Transportation needs - non-medical: None  Occupational History  . None  Tobacco Use  . Smoking status: Never Smoker  . Smokeless tobacco: Never Used  Substance and Sexual Activity  . Alcohol use: No    Alcohol/week: 0.0 oz  . Drug use: No  . Sexual activity: None  Other Topics Concern  . None  Social History Narrative  . None   Review of Systems - See HPI.  All other ROS are negative.  BP 122/78 (BP Location: Left Arm, Patient Position: Sitting, Cuff Size: Large)   Pulse 72   Temp 98.2 F (36.8 C) (Temporal)   Resp 18   Ht 5\' 10"  (1.778 m)   Wt (!) 326 lb 12.8 oz (148.2 kg)   SpO2 99%   BMI 46.89 kg/m   Physical Exam  Constitutional: She is oriented to person, place, and time and well-developed, well-nourished, and in no  distress.  HENT:  Head: Normocephalic and atraumatic.  Eyes: Conjunctivae are normal.  Cardiovascular: Normal rate, regular rhythm, normal heart sounds and intact distal pulses.  Pulmonary/Chest: Effort normal and breath sounds normal. No respiratory distress. She has no wheezes. She has no rales. She exhibits no tenderness.  Neurological: She is alert and oriented to person, place, and time.  Skin: Skin is warm and dry.  Psychiatric: Affect normal.  Vitals reviewed.  Assessment/Plan: 1. Encounter for Medicare annual wellness exam RN note reviewed. Health Maintenance updated. PHQ negative. Flu shot and Prevancr 13 given. Rx written for Shingrix.  2. NASH (nonalcoholic steatohepatitis) Repeat lipids and CMP. Discussed dietary and exercise regimen. - Comprehensive metabolic panel - Lipid panel  3. Disorder of musculoskeletal system Chronic low back pain for  which patient gets Rolfing massages with improvement. Declines further intervention. Will renew Rx for massages.  4. Morbid obesity ([place]) Body mass index is 46.89 kg/m. Dietary and exercise recommendations again reviewed. Unfortunately exercise is limited to chronic pain. - Lipid panel  5. Vitamin D deficiency Repeat Vitamin D level - Vitamin D (25 hydroxy)  6. Need for influenza vaccination Flu shot updated. - Flu Vaccine QUAD 6+ mos PF IM (Fluarix Quad PF)   Leeanne Rio, PA-C

## 2017-06-08 ENCOUNTER — Encounter: Payer: Self-pay | Admitting: Physician Assistant

## 2017-06-08 DIAGNOSIS — E559 Vitamin D deficiency, unspecified: Secondary | ICD-10-CM

## 2017-06-11 MED ORDER — VITAMIN D (ERGOCALCIFEROL) 1.25 MG (50000 UNIT) PO CAPS: 50000.0000 [IU] | ORAL_CAPSULE | ORAL | 0 refills | Status: DC

## 2017-06-29 DIAGNOSIS — L718 Other rosacea: Secondary | ICD-10-CM | POA: Diagnosis not present

## 2017-06-29 DIAGNOSIS — L308 Other specified dermatitis: Secondary | ICD-10-CM | POA: Diagnosis not present

## 2017-06-29 DIAGNOSIS — L570 Actinic keratosis: Secondary | ICD-10-CM | POA: Diagnosis not present

## 2017-07-11 ENCOUNTER — Encounter: Payer: Self-pay | Admitting: Physician Assistant

## 2017-07-11 ENCOUNTER — Ambulatory Visit: Payer: Medicare Other | Admitting: Family Medicine

## 2017-07-11 ENCOUNTER — Ambulatory Visit (INDEPENDENT_AMBULATORY_CARE_PROVIDER_SITE_OTHER): Payer: Medicare Other | Admitting: Physician Assistant

## 2017-07-11 ENCOUNTER — Ambulatory Visit: Payer: Medicare Other | Admitting: Physician Assistant

## 2017-07-11 VITALS — BP 110/78 | HR 78 | Temp 97.6°F | Resp 14 | Ht 70.0 in | Wt 335.0 lb

## 2017-07-11 DIAGNOSIS — S81801A Unspecified open wound, right lower leg, initial encounter: Secondary | ICD-10-CM | POA: Diagnosis not present

## 2017-07-11 DIAGNOSIS — Z23 Encounter for immunization: Secondary | ICD-10-CM

## 2017-07-11 MED ORDER — CEPHALEXIN 500 MG PO CAPS: 500.0000 mg | ORAL_CAPSULE | Freq: Two times a day (BID) | ORAL | 0 refills | Status: AC

## 2017-07-11 NOTE — Progress Notes (Signed)
Patient presents to clinic today c/o scrape of RLE occurring 2 days ago. Patient endorses she scratched her leg on something in the garage. Is unsure of what exactly this was. Noted slight bleeding at the time that resolved quickly. Notes some increased redness and tenderness over the past 24 hours. Denies fever, chills, purulent drainage from area. Has been keeping area clean and dry.   Past Medical History:  Diagnosis Date  . CTS (carpal tunnel syndrome)   . Fatty liver disease, nonalcoholic    In [facility] Study  . IBS (irritable bowel syndrome)    C/D  . Morbid obesity ([place])   . Osteoarthritis     Current Outpatient Medications on File Prior to Visit  Medication Sig Dispense Refill  . aspirin 81 MG tablet Take 81 mg by mouth daily.    Marland Kitchen aspirin-acetaminophen-caffeine (EXCEDRIN MIGRAINE) 250-250-65 MG per tablet Take 2 tablets by mouth every 6 (six) hours as needed for headache.    . betamethasone valerate (VALISONE) 0.1 % cream     . Calcium Carbonate 1500 (600 CA) MG TABS Take 2 tablets by mouth daily.    . Cholecalciferol (VITAMIN D-3) 1000 UNITS CAPS Take 1 each by mouth daily.    . diphenhydramine-acetaminophen (TYLENOL PM) 25-500 MG TABS Take 2 tablets by mouth at bedtime as needed (Pain & Sleep).    . NON FORMULARY ROLFING: Deep Tissue Massage, 2 Txs weekly as needed for joint pain    . Vitamin D, Ergocalciferol, (DRISDOL) 50000 units CAPS capsule Take 1 capsule (50,000 Units total) by mouth every 7 (seven) days. 12 capsule 0   No current facility-administered medications on file prior to visit.     Allergies  Allergen Reactions  . Lactose Intolerance (Gi)   . Celebrex [Celecoxib] Swelling and Rash    Family History  Problem Relation Age of Onset  . Stroke Mother 82       Deceased  . Hypertension Mother   . GI problems Mother   . Arthritis Mother   . Dementia Mother   . Heart defect Father 42       Deceased  . Parkinson's disease Father   . Stroke Maternal  Grandfather   . Arthritis Maternal Grandfather   . Kidney failure Maternal Grandmother   . Gallbladder disease Maternal Grandmother   . Diverticulitis Sister        #1  . Arthritis Sister   . Healthy Sister        #2    Social History   Socioeconomic History  . Marital status: Married    Spouse name: None  . Number of children: None  . Years of education: None  . Highest education level: None  Social Needs  . Financial resource strain: None  . Food insecurity - worry: None  . Food insecurity - inability: None  . Transportation needs - medical: None  . Transportation needs - non-medical: None  Occupational History  . None  Tobacco Use  . Smoking status: Never Smoker  . Smokeless tobacco: Never Used  Substance and Sexual Activity  . Alcohol use: No    Alcohol/week: 0.0 oz  . Drug use: No  . Sexual activity: None  Other Topics Concern  . None  Social History Narrative  . None   Review of Systems - See HPI.  All other ROS are negative.  Resp 14   Ht 5\' 10"  (1.778 m)   Wt (!) 335 lb (152 kg)   BMI 48.07 kg/m  Physical Exam  Constitutional: She is oriented to person, place, and time and well-developed, well-nourished, and in no distress.  HENT:  Head: Normocephalic and atraumatic.  Eyes: Conjunctivae are normal.  Neck: Neck supple.  Cardiovascular: Normal rate, regular rhythm, normal heart sounds and intact distal pulses.  Neurological: She is alert and oriented to person, place, and time.  Skin: Skin is warm and dry.     Psychiatric: Affect normal.  Vitals reviewed.   Recent Results (from the past 2160 hour(s))  Comprehensive metabolic panel     Status: None   Collection Time: 06/07/17  9:44 AM  Result Value Ref Range   Sodium 141 135 - 145 mEq/L   Potassium 4.5 3.5 - 5.1 mEq/L   Chloride 102 96 - 112 mEq/L   CO2 32 19 - 32 mEq/L   Glucose, Bld 91 70 - 99 mg/dL   BUN 13 6 - 23 mg/dL   Creatinine, Ser 0.65 0.40 - 1.20 mg/dL   Total Bilirubin 0.8  0.2 - 1.2 mg/dL   Alkaline Phosphatase 88 39 - 117 U/L   AST 20 0 - 37 U/L   ALT 16 0 - 35 U/L   Total Protein 7.0 6.0 - 8.3 g/dL   Albumin 4.3 3.5 - 5.2 g/dL   Calcium 9.8 8.4 - 10.5 mg/dL   GFR 96.97 >60.00 mL/min  Lipid panel     Status: Abnormal   Collection Time: 06/07/17  9:44 AM  Result Value Ref Range   Cholesterol 251 (H) 0 - 200 mg/dL    Comment: ATP III Classification       Desirable:  < 200 mg/dL               Borderline High:  200 - 239 mg/dL          High:  > = 240 mg/dL   Triglycerides 144.0 0.0 - 149.0 mg/dL    Comment: Normal:  <150 mg/dLBorderline High:  150 - 199 mg/dL   HDL 56.70 >39.00 mg/dL   VLDL 28.8 0.0 - 40.0 mg/dL   LDL Cholesterol 165 (H) 0 - 99 mg/dL   Total CHOL/HDL Ratio 4     Comment:                Men          Women1/2 Average Risk     3.4          3.3Average Risk          5.0          4.42X Average Risk          9.6          7.13X Average Risk          15.0          11.0                       NonHDL 194.11     Comment: NOTE:  Non-HDL goal should be 30 mg/dL higher than patient's LDL goal (i.e. LDL goal of < 70 mg/dL, would have non-HDL goal of < 100 mg/dL)  Vitamin D (25 hydroxy)     Status: Abnormal   Collection Time: 06/07/17  9:44 AM  Result Value Ref Range   VITD 18.74 (L) 30.00 - 100.00 ng/mL   Assessment/Plan: 1. Wound of right lower extremity, initial encounter Start Keflex for starting cellulitis. Wound care reviewed. Seems to be healing well so far. TD  updated. Strict return precautions given to patient and family.   - cephALEXin (KEFLEX) 500 MG capsule; Take 1 capsule (500 mg total) by mouth 2 (two) times daily for 7 days.  Dispense: 14 capsule; Refill: 0   Leeanne Rio, PA-C

## 2017-07-11 NOTE — Addendum Note (Signed)
Addended by: Leonidas Romberg on: 07/11/2017 12:22 PM   Modules accepted: Orders

## 2017-07-11 NOTE — Patient Instructions (Signed)
Please keep leg clean and dry. Take antibiotic as directed. You can apply topical Neosporin to the area but only a small amount is needed.  If you note any worsening symptoms or the area is not healing, please let us know.  Make sure to keep a low-salt diet and elevate legs while resting.

## 2017-09-04 DIAGNOSIS — H5213 Myopia, bilateral: Secondary | ICD-10-CM | POA: Diagnosis not present

## 2017-09-04 DIAGNOSIS — H2513 Age-related nuclear cataract, bilateral: Secondary | ICD-10-CM | POA: Diagnosis not present

## 2017-09-04 DIAGNOSIS — H35413 Lattice degeneration of retina, bilateral: Secondary | ICD-10-CM | POA: Diagnosis not present

## 2017-09-04 DIAGNOSIS — H04123 Dry eye syndrome of bilateral lacrimal glands: Secondary | ICD-10-CM | POA: Diagnosis not present

## 2017-09-04 DIAGNOSIS — H52223 Regular astigmatism, bilateral: Secondary | ICD-10-CM | POA: Diagnosis not present

## 2017-09-15 LAB — HEPATIC FUNCTION PANEL
ALT: 13 (ref 7–35)
AST: 22 (ref 13–35)
Alkaline Phosphatase: 98 (ref 25–125)
Bilirubin, Total: 0.6

## 2017-09-15 LAB — BASIC METABOLIC PANEL
BUN: 14 (ref 4–21)
CREATININE: 0.7 (ref 0.5–1.1)

## 2017-09-15 LAB — LIPID PANEL
CHOLESTEROL: 221 — AB (ref 0–200)
HDL: 57 (ref 35–70)
LDL Cholesterol: 137
LDL/HDL RATIO: 2.4
TRIGLYCERIDES: 134 (ref 40–160)

## 2017-09-15 LAB — HM HIV SCREENING LAB: HM HIV SCREENING: NEGATIVE

## 2017-09-15 LAB — CBC AND DIFFERENTIAL: Hemoglobin: 16 (ref 12.0–16.0)

## 2017-09-15 LAB — HEMOGLOBIN A1C: HEMOGLOBIN A1C: 5.5

## 2017-09-15 LAB — HM HEPATITIS C SCREENING LAB: HM Hepatitis Screen: NEGATIVE

## 2017-09-19 ENCOUNTER — Ambulatory Visit (INDEPENDENT_AMBULATORY_CARE_PROVIDER_SITE_OTHER): Payer: Medicare Other | Admitting: Physician Assistant

## 2017-09-19 ENCOUNTER — Other Ambulatory Visit: Payer: Self-pay

## 2017-09-19 ENCOUNTER — Encounter: Payer: Self-pay | Admitting: Physician Assistant

## 2017-09-19 VITALS — BP 106/74 | HR 76 | Temp 97.7°F | Resp 14 | Ht 70.0 in | Wt 327.6 lb

## 2017-09-19 DIAGNOSIS — R21 Rash and other nonspecific skin eruption: Secondary | ICD-10-CM | POA: Diagnosis not present

## 2017-09-19 DIAGNOSIS — W57XXXA Bitten or stung by nonvenomous insect and other nonvenomous arthropods, initial encounter: Secondary | ICD-10-CM

## 2017-09-19 MED ORDER — TRIAMCINOLONE ACETONIDE 0.1 % EX CREA: 1.0000 "application " | TOPICAL_CREAM | Freq: Two times a day (BID) | CUTANEOUS | 0 refills | Status: DC

## 2017-09-19 NOTE — Progress Notes (Signed)
Patient presents to clinic today c/o 6 days of scattered bumps of her neck and clavicular region. These are slightly itchy, non painful. Denies fever, chills, malaise or fatigue. Notes caregiver for husband also takes care of a family with 12 cats and recently had to have the house bombed for fleas. The caregiver sits in a chair that only patient sits in.   Past Medical History:  Diagnosis Date  . CTS (carpal tunnel syndrome)   . Fatty liver disease, nonalcoholic    In [facility] Study  . IBS (irritable bowel syndrome)    C/D  . Morbid obesity ([place])   . Osteoarthritis     Current Outpatient Medications on File Prior to Visit  Medication Sig Dispense Refill  . Calcium Carbonate 1500 (600 CA) MG TABS Take 2 tablets by mouth daily.    . Cholecalciferol (VITAMIN D-3) 1000 UNITS CAPS Take 1 each by mouth daily.    . diphenhydramine-acetaminophen (TYLENOL PM) 25-500 MG TABS Take 2 tablets by mouth at bedtime as needed (Pain & Sleep).    . NON FORMULARY ROLFING: Deep Tissue Massage, 2 Txs weekly as needed for joint pain    . Probiotic Product (PROBIOTIC-10 PO) Take 1 tablet by mouth daily.    Colbert Coyer Min CaCrCuFeKMgMnPSeZn (MINERALS PO) Take 1 oz by mouth daily.    . Vitamin D, Ergocalciferol, (DRISDOL) 50000 units CAPS capsule Take 1 capsule (50,000 Units total) by mouth every 7 (seven) days. 12 capsule 0  . aspirin 81 MG tablet Take 81 mg by mouth daily.    Marland Kitchen aspirin-acetaminophen-caffeine (EXCEDRIN MIGRAINE) 250-250-65 MG per tablet Take 2 tablets by mouth every 6 (six) hours as needed for headache.     No current facility-administered medications on file prior to visit.     Allergies  Allergen Reactions  . Lactose Intolerance (Gi)   . Celebrex [Celecoxib] Swelling and Rash    Family History  Problem Relation Age of Onset  . Stroke Mother 29       Deceased  . Hypertension Mother   . GI problems Mother   . Arthritis Mother   . Dementia Mother   . Heart defect Father 20   Deceased  . Parkinson's disease Father   . Stroke Maternal Grandfather   . Arthritis Maternal Grandfather   . Kidney failure Maternal Grandmother   . Gallbladder disease Maternal Grandmother   . Diverticulitis Sister        #1  . Arthritis Sister   . Healthy Sister        #2    Social History   Socioeconomic History  . Marital status: Married    Spouse name: None  . Number of children: None  . Years of education: None  . Highest education level: None  Social Needs  . Financial resource strain: None  . Food insecurity - worry: None  . Food insecurity - inability: None  . Transportation needs - medical: None  . Transportation needs - non-medical: None  Occupational History  . None  Tobacco Use  . Smoking status: Never Smoker  . Smokeless tobacco: Never Used  Substance and Sexual Activity  . Alcohol use: No    Alcohol/week: 0.0 oz  . Drug use: No  . Sexual activity: No  Other Topics Concern  . None  Social History Narrative  . None   Review of Systems - See HPI.  All other ROS are negative.  BP 106/74   Pulse 76   Temp 97.7 F (  36.5 C) (Oral)   Resp 14   Ht 5\' 10"  (1.778 m)   Wt (!) 327 lb 9.6 oz (148.6 kg)   SpO2 96%   BMI 47.01 kg/m   Physical Exam  Constitutional: She is oriented to person, place, and time and well-developed, well-nourished, and in no distress.  HENT:  Head: Normocephalic and atraumatic.  Eyes: Conjunctivae are normal.  Cardiovascular: Normal rate, regular rhythm, normal heart sounds and intact distal pulses.  Pulmonary/Chest: Effort normal and breath sounds normal. No respiratory distress. She has no wheezes. She has no rales. She exhibits no tenderness.  Neurological: She is alert and oriented to person, place, and time.  Skin: Skin is warm and dry. Rash noted. Rash is macular (located around neck. Scattered macules -- seem consistent with bites).  Vitals reviewed.  Assessment/Plan: 1. Bug bite, initial encounter Rx triamcinolone  to help with itch. Discussed likely vector is her chair based on what she has endorses. Will get flea spray to apply to the chair as directed. Follow-up if not improving.    Leeanne Rio, PA-C

## 2017-09-19 NOTE — Patient Instructions (Signed)
Please stay well-hydrated. You can use the cream given as directed for up to two weeks to help with any major itch.  Get a flea spray to spray the chair down. Follow directions for removal/clean up before reusing the chair. This is the most likely vector for the bites based on your history.  If symptoms are not resolving, please call me.

## 2017-09-25 ENCOUNTER — Encounter: Payer: Self-pay | Admitting: Physician Assistant

## 2017-09-25 ENCOUNTER — Ambulatory Visit (INDEPENDENT_AMBULATORY_CARE_PROVIDER_SITE_OTHER): Payer: Medicare Other | Admitting: Physician Assistant

## 2017-09-25 ENCOUNTER — Other Ambulatory Visit: Payer: Self-pay

## 2017-09-25 VITALS — BP 124/66 | HR 77 | Temp 97.6°F | Resp 16 | Ht 70.0 in | Wt 325.0 lb

## 2017-09-25 DIAGNOSIS — W57XXXD Bitten or stung by nonvenomous insect and other nonvenomous arthropods, subsequent encounter: Secondary | ICD-10-CM | POA: Diagnosis not present

## 2017-09-25 DIAGNOSIS — S50861A Insect bite (nonvenomous) of right forearm, initial encounter: Secondary | ICD-10-CM | POA: Diagnosis not present

## 2017-09-25 DIAGNOSIS — E7800 Pure hypercholesterolemia, unspecified: Secondary | ICD-10-CM | POA: Insufficient documentation

## 2017-09-25 DIAGNOSIS — R7989 Other specified abnormal findings of blood chemistry: Secondary | ICD-10-CM | POA: Diagnosis not present

## 2017-09-25 DIAGNOSIS — S90562A Insect bite (nonvenomous), left ankle, initial encounter: Secondary | ICD-10-CM

## 2017-09-25 DIAGNOSIS — R0602 Shortness of breath: Secondary | ICD-10-CM | POA: Diagnosis not present

## 2017-09-25 DIAGNOSIS — S1096XA Insect bite of unspecified part of neck, initial encounter: Secondary | ICD-10-CM | POA: Diagnosis not present

## 2017-09-25 DIAGNOSIS — S90561A Insect bite (nonvenomous), right ankle, initial encounter: Secondary | ICD-10-CM | POA: Diagnosis not present

## 2017-09-25 MED ORDER — PERMETHRIN 5 % EX CREA: 1.0000 "application " | TOPICAL_CREAM | Freq: Once | CUTANEOUS | 0 refills | Status: AC

## 2017-09-25 NOTE — Progress Notes (Signed)
Patient presents to clinic today c/o new bits of forearms. At last visit, patient was instructed on how to clean the house to help with getting rid of mites/bugs that were the culprit of her symptoms. Endorses following instructions. Notes prior lesions of neck are healing well. No new bites in this area but notes bites of R forearm and ankles. Does note feel bites when they occur. Denies fever, chills or malaise.   Patient also with recent updated lab results for review. .   Past Medical History:  Diagnosis Date  . CTS (carpal tunnel syndrome)   . Fatty liver disease, nonalcoholic    In [facility] Study  . IBS (irritable bowel syndrome)    C/D  . Morbid obesity ([place])   . Osteoarthritis     Current Outpatient Medications on File Prior to Visit  Medication Sig Dispense Refill  . aspirin 81 MG tablet Take 81 mg by mouth daily.    Marland Kitchen aspirin-acetaminophen-caffeine (EXCEDRIN MIGRAINE) 250-250-65 MG per tablet Take 2 tablets by mouth every 6 (six) hours as needed for headache.    . Calcium Carbonate 1500 (600 CA) MG TABS Take 2 tablets by mouth daily.    . Cholecalciferol (VITAMIN D-3) 1000 UNITS CAPS Take 1 each by mouth daily.    . diphenhydramine-acetaminophen (TYLENOL PM) 25-500 MG TABS Take 2 tablets by mouth at bedtime as needed (Pain & Sleep).    . NON FORMULARY ROLFING: Deep Tissue Massage, 2 Txs weekly as needed for joint pain    . Probiotic Product (PROBIOTIC-10 PO) Take 1 tablet by mouth daily.    Colbert Coyer Min CaCrCuFeKMgMnPSeZn (MINERALS PO) Take 1 oz by mouth daily.    Marland Kitchen triamcinolone cream (KENALOG) 0.1 % Apply 1 application topically 2 (two) times daily. 30 g 0  . Vitamin D, Ergocalciferol, (DRISDOL) 50000 units CAPS capsule Take 1 capsule (50,000 Units total) by mouth every 7 (seven) days. 12 capsule 0   No current facility-administered medications on file prior to visit.     Allergies  Allergen Reactions  . Lactose Intolerance (Gi)   . Celebrex [Celecoxib] Swelling and Rash      Family History  Problem Relation Age of Onset  . Stroke Mother 18       Deceased  . Hypertension Mother   . GI problems Mother   . Arthritis Mother   . Dementia Mother   . Heart defect Father 65       Deceased  . Parkinson's disease Father   . Stroke Maternal Grandfather   . Arthritis Maternal Grandfather   . Kidney failure Maternal Grandmother   . Gallbladder disease Maternal Grandmother   . Diverticulitis Sister        #1  . Arthritis Sister   . Healthy Sister        #2    Social History   Socioeconomic History  . Marital status: Married    Spouse name: None  . Number of children: None  . Years of education: None  . Highest education level: None  Social Needs  . Financial resource strain: None  . Food insecurity - worry: None  . Food insecurity - inability: None  . Transportation needs - medical: None  . Transportation needs - non-medical: None  Occupational History  . None  Tobacco Use  . Smoking status: Never Smoker  . Smokeless tobacco: Never Used  Substance and Sexual Activity  . Alcohol use: No    Alcohol/week: 0.0 oz  . Drug use:  No  . Sexual activity: No  Other Topics Concern  . None  Social History Narrative  . None   Review of Systems - See HPI.  All other ROS are negative.  BP 124/66   Pulse 77   Temp 97.6 F (36.4 C) (Oral)   Resp 16   Ht 5\' 10"  (1.778 m)   Wt (!) 325 lb (147.4 kg)   SpO2 99%   BMI 46.63 kg/m   Physical Exam  Constitutional: She is oriented to person, place, and time and well-developed, well-nourished, and in no distress.  HENT:  Head: Normocephalic and atraumatic.  Cardiovascular: Normal rate, regular rhythm, normal heart sounds and intact distal pulses.  No murmur heard. Exam negative for peripheral edema.   Pulmonary/Chest: Effort normal and breath sounds normal. No respiratory distress. She has no wheezes. She has no rales. She exhibits no tenderness.  Neurological: She is alert and oriented to person,  place, and time.  Skin: Skin is warm and dry.  Scatted maculopapules of neck, R forearm and ankles bilaterally, consistent with bites.   Psychiatric: Affect normal.  Vitals reviewed.  Assessment/Plan: 1. Bug bite, subsequent encounter Rx Permethrin. Supportive measures reviewed. Patient to call exterminator to assess the house.   2. Pure hypercholesterolemia Discussed elevated TChol and LDL levels along with morbid obesity increase risk for stoke and heart attack. Only mild elevation in LDL to 137. Declines statin. Is working hard on diet and exercise. Will continue along with starting a Red Yeast Rice supplement. Repeat levels in 3 months. Will abstract her recent labs into chart.   3. Elevated brain natriuretic peptide (BNP) level Slight elevation. No edema. Denies PND or orthopnea. Lungs CTAB. Heart exam without abnormal finding on auscultation. Giving concerns, will check baseline Echocardiogram.    Leeanne Rio, PA-C

## 2017-09-25 NOTE — Patient Instructions (Signed)
Please stay well-hydrated.  Apply the prescription cream from the neck down (avoiding sensitive areas).  Wash off in the time frame directed on packaging.   I would recommend if there are any new bites after this that you call an exterminator to assess the dwelling.   I will abstract the lab results you have given me into your chart.  Please keep up with diet and exercise.  Start a red yeast rice supplement to help with cholesterol.   We will recheck levels in 3 months.  You will be contacted for an [place] of your heart giving abnormal BNP on testing.

## 2017-10-10 ENCOUNTER — Other Ambulatory Visit (HOSPITAL_COMMUNITY): Payer: Medicare Other

## 2017-10-17 ENCOUNTER — Encounter (HOSPITAL_COMMUNITY): Payer: Self-pay | Admitting: Radiology

## 2017-11-13 ENCOUNTER — Ambulatory Visit (HOSPITAL_BASED_OUTPATIENT_CLINIC_OR_DEPARTMENT_OTHER)
Admission: RE | Admit: 2017-11-13 | Discharge: 2017-11-13 | Disposition: A | Discharge status: Home / Self Care | Payer: Medicare Other | Source: Ambulatory Visit | Attending: Physician Assistant | Admitting: Physician Assistant

## 2017-11-13 ENCOUNTER — Other Ambulatory Visit (HOSPITAL_BASED_OUTPATIENT_CLINIC_OR_DEPARTMENT_OTHER): Payer: Medicare Other

## 2017-11-13 DIAGNOSIS — R7989 Other specified abnormal findings of blood chemistry: Secondary | ICD-10-CM

## 2017-11-13 DIAGNOSIS — E785 Hyperlipidemia, unspecified: Secondary | ICD-10-CM | POA: Diagnosis not present

## 2017-11-13 DIAGNOSIS — R0602 Shortness of breath: Secondary | ICD-10-CM

## 2017-11-13 NOTE — Progress Notes (Signed)
Echocardiogram 2D Echocardiogram has been performed.  Joelene Millin 11/13/2017, 3:43 PM

## 2018-03-05 DIAGNOSIS — H35413 Lattice degeneration of retina, bilateral: Secondary | ICD-10-CM | POA: Diagnosis not present

## 2018-03-05 DIAGNOSIS — H2513 Age-related nuclear cataract, bilateral: Secondary | ICD-10-CM | POA: Diagnosis not present

## 2018-03-05 DIAGNOSIS — H04123 Dry eye syndrome of bilateral lacrimal glands: Secondary | ICD-10-CM | POA: Diagnosis not present

## 2018-03-05 DIAGNOSIS — H5213 Myopia, bilateral: Secondary | ICD-10-CM | POA: Diagnosis not present

## 2018-03-05 DIAGNOSIS — H52223 Regular astigmatism, bilateral: Secondary | ICD-10-CM | POA: Diagnosis not present

## 2018-04-25 ENCOUNTER — Ambulatory Visit (INDEPENDENT_AMBULATORY_CARE_PROVIDER_SITE_OTHER): Payer: Medicare Other | Admitting: Podiatry

## 2018-04-25 ENCOUNTER — Ambulatory Visit (INDEPENDENT_AMBULATORY_CARE_PROVIDER_SITE_OTHER): Payer: Medicare Other

## 2018-04-25 VITALS — BP 111/70 | HR 78

## 2018-04-25 DIAGNOSIS — Q828 Other specified congenital malformations of skin: Secondary | ICD-10-CM | POA: Diagnosis not present

## 2018-04-25 DIAGNOSIS — M216X2 Other acquired deformities of left foot: Secondary | ICD-10-CM | POA: Diagnosis not present

## 2018-04-25 DIAGNOSIS — M79672 Pain in left foot: Secondary | ICD-10-CM

## 2018-04-26 ENCOUNTER — Other Ambulatory Visit: Payer: Self-pay | Admitting: Podiatry

## 2018-04-26 DIAGNOSIS — Q828 Other specified congenital malformations of skin: Secondary | ICD-10-CM

## 2018-04-28 NOTE — Progress Notes (Signed)
Subjective:   Patient ID: Leslie Knapp, female   DOB: 67 y.o.   MRN: [id number]   HPI 67 year old female presents the office today for concerns of a painful callus on the left foot.  She points to submetatarsal 5 where she gets her symptoms.  She states this been ongoing issue for her and started about 1 year ago.  She is been tried trimming herself is gotten worse over time.  She thinks it may be partially because because she did have some dropfoot on the left side but this is gotten somewhat better she is wondering if this is a contributing factor to it.  Denies any redness or drainage or any swelling to the area.  No other concerns.   Review of Systems  All other systems reviewed and are negative.  Past Medical History:  Diagnosis Date  . CTS (carpal tunnel syndrome)   . Fatty liver disease, nonalcoholic    In [facility] Study  . IBS (irritable bowel syndrome)    C/D  . Morbid obesity ([place])   . Osteoarthritis     Past Surgical History:  Procedure Laterality Date  . KNEE SURGERY  1975   Left Patella Tendon Replacement  . TONSILLECTOMY  1963  . 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 TOTAL KNEE ARTHROPLASTY  11.2005   Right  . UTERINE POLYP REMOVAL  2010     Current Outpatient Medications:  .  amoxicillin (AMOXIL) 500 MG tablet, Take 4 tablets 1 hour before dental appointment., Disp: , Rfl:  .  atorvastatin (LIPITOR) 10 MG tablet, Take by mouth., Disp: , Rfl:  .  Diphenhydramine-APAP 25-500 MG/15ML LIQD, Take by mouth., Disp: , Rfl:  .  aspirin 81 MG tablet, Take 81 mg by mouth daily., Disp: , Rfl:  .  aspirin-acetaminophen-caffeine (EXCEDRIN MIGRAINE) 250-250-65 MG per tablet, Take 2 tablets by mouth every 6 (six) hours as needed for headache., Disp: , Rfl:  .  Calcium Carbonate 1500 (600 CA) MG TABS, Take 2 tablets by mouth daily., Disp: , Rfl:  .  Cholecalciferol (VITAMIN D-3) 1000 UNITS CAPS, Take 1 each by mouth daily., Disp: , Rfl:  .  Digestive Enzymes (ENZYME  DIGEST) CAPS, Take by mouth., Disp: , Rfl:  .  diphenhydramine-acetaminophen (TYLENOL PM) 25-500 MG TABS, Take 2 tablets by mouth at bedtime as needed (Pain & Sleep)., Disp: , Rfl:  .  NON FORMULARY, ROLFING: Deep Tissue Massage, 2 Txs weekly as needed for joint pain, Disp: , Rfl:  .  Probiotic Product (PROBIOTIC-10 PO), Take 1 tablet by mouth daily., Disp: , Rfl:  .  Trace Min CaCrCuFeKMgMnPSeZn (MINERALS PO), Take 1 oz by mouth daily., Disp: , Rfl:  .  triamcinolone cream (KENALOG) 0.1 %, Apply 1 application topically 2 (two) times daily., Disp: 30 g, Rfl: 0 .  Vitamin D, Ergocalciferol, (DRISDOL) 50000 units CAPS capsule, Take 1 capsule (50,000 Units total) by mouth every 7 (seven) days., Disp: 12 capsule, Rfl: 0  Allergies  Allergen Reactions  . Lactose Intolerance (Gi)   . Celebrex [Celecoxib] Swelling and Rash         Objective:  Physical Exam  General: AAO x3, NAD  Dermatological: Hyperkeratotic lesion left foot submetatarsal 5.  Upon debridement there is no underlying ulceration, drainage or any clinical signs of infection present today.  No other open lesions or pre-ulcerative lesions are identified.  Vascular: Dorsalis Pedis artery and Posterior Tibial artery pedal pulses are 2/4 bilateral with immedate capillary fill time.. There  is no pain with calf compression, swelling, warmth, erythema.   Neruologic: Grossly intact via light touch bilateral.  Protective threshold with Semmes Wienstein monofilament intact to all pedal sites bilateral.   Musculoskeletal: Prominence of metatarsal heads plantarly with atrophy of the fat pad.  Muscular strength 5/5 in all groups tested bilateral.  Gait: Unassisted, Nonantalgic.  Assessment:    Porokeratosis left submetatarsal 5  Plan:  -Treatment options discussed including all alternatives, risks, and complications -Etiology of symptoms were discussed -X-rays were obtained and reviewed with the patient.  There is no evidence of acute  fracture or stress fracture.  No significant bone spurs are present. -I sharply debrided the hyperkeratotic lesion to the left foot without any complications or bleeding.  Continue moisturizer and offloading pads daily.  Discussed shoe modifications as well.  Trula Slade DPM

## 2018-05-07 ENCOUNTER — Other Ambulatory Visit: Payer: Self-pay | Admitting: Physician Assistant

## 2018-05-07 DIAGNOSIS — Z1231 Encounter for screening mammogram for malignant neoplasm of breast: Secondary | ICD-10-CM

## 2018-05-11 ENCOUNTER — Ambulatory Visit (HOSPITAL_BASED_OUTPATIENT_CLINIC_OR_DEPARTMENT_OTHER): Payer: Medicare Other

## 2018-05-13 ENCOUNTER — Ambulatory Visit (HOSPITAL_BASED_OUTPATIENT_CLINIC_OR_DEPARTMENT_OTHER)
Admission: RE | Admit: 2018-05-13 | Discharge: 2018-05-13 | Disposition: A | Discharge status: Home / Self Care | Payer: Medicare Other | Source: Ambulatory Visit | Attending: Physician Assistant | Admitting: Physician Assistant

## 2018-05-13 DIAGNOSIS — Z1231 Encounter for screening mammogram for malignant neoplasm of breast: Secondary | ICD-10-CM | POA: Diagnosis not present

## 2018-05-17 ENCOUNTER — Encounter: Payer: Self-pay | Admitting: Physician Assistant

## 2018-05-21 DIAGNOSIS — Z96653 Presence of artificial knee joint, bilateral: Secondary | ICD-10-CM | POA: Diagnosis not present

## 2018-05-21 DIAGNOSIS — E785 Hyperlipidemia, unspecified: Secondary | ICD-10-CM | POA: Diagnosis not present

## 2018-05-21 DIAGNOSIS — Z6841 Body Mass Index (BMI) 40.0 and over, adult: Secondary | ICD-10-CM | POA: Diagnosis not present

## 2018-05-21 DIAGNOSIS — R7302 Impaired glucose tolerance (oral): Secondary | ICD-10-CM | POA: Diagnosis not present

## 2018-05-21 DIAGNOSIS — E669 Obesity, unspecified: Secondary | ICD-10-CM | POA: Diagnosis not present

## 2018-05-21 DIAGNOSIS — E559 Vitamin D deficiency, unspecified: Secondary | ICD-10-CM | POA: Diagnosis not present

## 2018-05-21 DIAGNOSIS — G4733 Obstructive sleep apnea (adult) (pediatric): Secondary | ICD-10-CM | POA: Diagnosis not present

## 2018-05-21 DIAGNOSIS — Z8639 Personal history of other endocrine, nutritional and metabolic disease: Secondary | ICD-10-CM | POA: Diagnosis not present

## 2018-05-21 DIAGNOSIS — K760 Fatty (change of) liver, not elsewhere classified: Secondary | ICD-10-CM | POA: Diagnosis not present

## 2018-05-21 LAB — BASIC METABOLIC PANEL
BUN: 11 (ref 4–21)
Creatinine: 0.6 (ref 0.5–1.1)
GLUCOSE: 87
Potassium: 4.1 (ref 3.4–5.3)
Sodium: 141 (ref 137–147)

## 2018-05-21 LAB — HEMOGLOBIN A1C: HEMOGLOBIN A1C: 5.7

## 2018-05-21 LAB — HEPATIC FUNCTION PANEL
ALT: 16 (ref 7–35)
AST: 20 (ref 13–35)
Alkaline Phosphatase: 75 (ref 25–125)
BILIRUBIN, TOTAL: 0.5

## 2018-05-21 LAB — TSH: TSH: 1.61 (ref 0.41–5.90)

## 2018-05-21 LAB — CBC AND DIFFERENTIAL
HEMATOCRIT: 47 — AB (ref 36–46)
HEMOGLOBIN: 14.8 (ref 12.0–16.0)
Platelets: 222 (ref 150–399)
WBC: 6.5

## 2018-05-21 LAB — LIPID PANEL
Cholesterol: 222 — AB (ref 0–200)
HDL: 57 (ref 35–70)
LDL Cholesterol: 138
Triglycerides: 137 (ref 40–160)

## 2018-05-21 LAB — VITAMIN D 25 HYDROXY (VIT D DEFICIENCY, FRACTURES): Vit D, 25-Hydroxy: 28

## 2018-05-29 ENCOUNTER — Encounter: Payer: Self-pay | Admitting: Physician Assistant

## 2018-06-14 DIAGNOSIS — L821 Other seborrheic keratosis: Secondary | ICD-10-CM | POA: Diagnosis not present

## 2018-06-14 DIAGNOSIS — L570 Actinic keratosis: Secondary | ICD-10-CM | POA: Diagnosis not present

## 2018-06-14 DIAGNOSIS — L820 Inflamed seborrheic keratosis: Secondary | ICD-10-CM | POA: Diagnosis not present

## 2018-06-14 DIAGNOSIS — L718 Other rosacea: Secondary | ICD-10-CM | POA: Diagnosis not present

## 2018-06-24 DIAGNOSIS — K760 Fatty (change of) liver, not elsewhere classified: Secondary | ICD-10-CM | POA: Diagnosis not present

## 2018-06-27 ENCOUNTER — Encounter: Payer: Self-pay | Admitting: Physician Assistant

## 2018-06-27 ENCOUNTER — Ambulatory Visit (INDEPENDENT_AMBULATORY_CARE_PROVIDER_SITE_OTHER): Payer: Medicare Other | Admitting: Physician Assistant

## 2018-06-27 VITALS — BP 132/82 | HR 82 | Temp 97.5°F | Resp 14 | Ht 70.0 in | Wt 336.0 lb

## 2018-06-27 DIAGNOSIS — Z0000 Encounter for general adult medical examination without abnormal findings: Secondary | ICD-10-CM

## 2018-06-27 DIAGNOSIS — Z780 Asymptomatic menopausal state: Secondary | ICD-10-CM | POA: Diagnosis not present

## 2018-06-27 DIAGNOSIS — Z23 Encounter for immunization: Secondary | ICD-10-CM | POA: Diagnosis not present

## 2018-06-27 MED ORDER — SUVOREXANT 10 MG PO TABS: 10.0000 mg | ORAL_TABLET | Freq: Every day | ORAL | 0 refills | Status: DC

## 2018-06-27 NOTE — Progress Notes (Signed)
Subjective:   Leslie Knapp is a 67 y.o. female who presents for Medicare Annual (Subsequent) preventive examination.  Review of Systems:  Review of Systems  Constitutional: Negative for fever and weight loss.  HENT: Negative for ear discharge, ear pain, hearing loss and tinnitus.   Eyes: Negative for blurred vision, double vision, photophobia and pain.  Respiratory: Negative for cough and shortness of breath.   Cardiovascular: Negative for chest pain and palpitations.  Gastrointestinal: Negative for abdominal pain, blood in stool, constipation, diarrhea, heartburn, melena, nausea and vomiting.  Genitourinary: Negative for dysuria, flank pain, frequency, hematuria and urgency.  Musculoskeletal: Negative for falls.  Neurological: Negative for dizziness, loss of consciousness and headaches.  Endo/Heme/Allergies: Negative for environmental allergies.  Psychiatric/Behavioral: Negative for depression, hallucinations, substance abuse and suicidal ideas. The patient is not nervous/anxious and does not have insomnia.       Objective:     Vitals: BP 132/82   Pulse 82   Temp (!) 97.5 F (36.4 C) (Oral)   Resp 14   Ht 5\' 10"  (1.778 m)   Wt (!) 336 lb (152.4 kg)   SpO2 98%   BMI 48.21 kg/m   Body mass index is 48.21 kg/m.  Advanced Directives 06/27/2018 06/07/2017  Does Patient Have a Medical Advance Directive? Yes Yes  Type of Advance Directive Living will;Healthcare Power of [place];Living will  Does patient want to make changes to medical advance directive? No - Patient declined -  Copy of [place] in Chart? No - copy requested Yes    Tobacco Social History   Tobacco Use  Smoking Status Never Smoker  Smokeless Tobacco Never Used     Counseling given: Not Answered   Clinical Intake:  Pre-visit preparation completed: Yes  Pain : No/denies pain     Nutritional Status: BMI > 30  Obese Diabetes: No  How often do  you need to have someone help you when you read instructions, pamphlets, or other written materials from your doctor or pharmacy?: 1 - Never  Interpreter Needed?: No     Past Medical History:  Diagnosis Date  . CTS (carpal tunnel syndrome)   . Fatty liver disease, nonalcoholic    In [facility] Study  . IBS (irritable bowel syndrome)    C/D  . Morbid obesity ([place])   . Osteoarthritis    Past Surgical History:  Procedure Laterality Date  . KNEE SURGERY  1975   Left Patella Tendon Replacement  . TONSILLECTOMY  1963  . 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 TOTAL KNEE ARTHROPLASTY  11.2005   Right  . UTERINE POLYP REMOVAL  2010   Family History  Problem Relation Age of Onset  . Stroke Mother 69       Deceased  . Hypertension Mother   . GI problems Mother   . Arthritis Mother   . Dementia Mother   . Heart defect Father 19       Deceased  . Parkinson's disease Father   . Stroke Maternal Grandfather   . Arthritis Maternal Grandfather   . Kidney failure Maternal Grandmother   . Gallbladder disease Maternal Grandmother   . Diverticulitis Sister        #1  . Arthritis Sister   . Healthy Sister        #2   Social History   Socioeconomic History  . Marital status: Married    Spouse name: Not on file  . Number  of children: Not on file  . Years of education: Not on file  . Highest education level: Not on file  Occupational History  . Not on file  Social Needs  . Financial resource strain: Not on file  . Food insecurity:    Worry: Not on file    Inability: Not on file  . Transportation needs:    Medical: Not on file    Non-medical: Not on file  Tobacco Use  . Smoking status: Never Smoker  . Smokeless tobacco: Never Used  Substance and Sexual Activity  . Alcohol use: No    Alcohol/week: 0.0 standard drinks  . Drug use: No  . Sexual activity: Never  Lifestyle  . Physical activity:    Days per week: Not on file    Minutes per session: Not on file  . Stress:  Not on file  Relationships  . Social connections:    Talks on phone: Not on file    Gets together: Not on file    Attends religious service: Not on file    Active member of club or organization: Not on file    Attends meetings of clubs or organizations: Not on file    Relationship status: Not on file  Other Topics Concern  . Not on file  Social History Narrative  . Not on file    Outpatient Encounter Medications as of 06/27/2018  Medication Sig  . amoxicillin (AMOXIL) 500 MG tablet Take 4 tablets 1 hour before dental appointment.  Marland Kitchen aspirin 81 MG tablet Take 81 mg by mouth daily.  Marland Kitchen aspirin-acetaminophen-caffeine (EXCEDRIN MIGRAINE) 250-250-65 MG per tablet Take 2 tablets by mouth every 6 (six) hours as needed for headache.  Marland Kitchen atorvastatin (LIPITOR) 10 MG tablet Take by mouth.  . Calcium Carbonate 1500 (600 CA) MG TABS Take 2 tablets by mouth daily.  . Cholecalciferol (VITAMIN D-3) 1000 UNITS CAPS Take 1 each by mouth daily.  . Digestive Enzymes (ENZYME DIGEST) CAPS Take by mouth.  . diphenhydramine-acetaminophen (TYLENOL PM) 25-500 MG TABS Take 2 tablets by mouth at bedtime as needed (Pain & Sleep).  . Diphenhydramine-APAP 25-500 MG/15ML LIQD Take by mouth.  . NON FORMULARY ROLFING: Deep Tissue Massage, 2 Txs weekly as needed for joint pain  . Probiotic Product (PROBIOTIC-10 PO) Take 1 tablet by mouth daily.  Colbert Coyer Min CaCrCuFeKMgMnPSeZn (MINERALS PO) Take 1 oz by mouth daily.  Marland Kitchen triamcinolone cream (KENALOG) 0.1 % Apply 1 application topically 2 (two) times daily.  . Vitamin D, Ergocalciferol, (DRISDOL) 50000 units CAPS capsule Take 1 capsule (50,000 Units total) by mouth every 7 (seven) days.  . Suvorexant (BELSOMRA) 10 MG TABS Take 10 mg by mouth at bedtime.   No facility-administered encounter medications on file as of 06/27/2018.     Activities of Daily Living In your present state of health, do you have any difficulty performing the following activities: 06/27/2018    Hearing? N  Vision? N  Difficulty concentrating or making decisions? N  Walking or climbing stairs? N  Dressing or bathing? N  Doing errands, shopping? N  Preparing Food and eating ? N  Using the Toilet? N  In the past six months, have you accidently leaked urine? N  Do you have problems with loss of bowel control? N  Managing your Medications? N  Managing your Finances? N  Housekeeping or managing your Housekeeping? N  Some recent data might be hidden    Patient Care Team: Delorse Limber as PCP -  General (Physician Assistant) Shiela Mayer, MD as Referring Physician (Gastroenterology) Sheryn Bison, MD as Referring Physician (Dermatology) Avie Echevaria., MD as Referring Physician (Sports Medicine) Shearon Balo, [place] (Chiropractic Medicine) Calvert Cantor, MD as Consulting Physician (Ophthalmology)    Assessment:   This is a routine wellness examination for Leslie Knapp.  Exercise Activities and Dietary recommendations Current Exercise Habits: The patient does not participate in regular exercise at present, Exercise limited by: None identified  Goals    . Weight (lb) < 275 lb (124.7 kg)     Lose weight by dieting.        Fall Risk Fall Risk  06/27/2018 06/07/2017 03/08/2016  Falls in the past year? 0 No No  Number falls in past yr: 0 - -  Injury with Fall? 0 - -   Is the patient's home free of loose throw rugs in walkways, pet beds, electrical cords, etc?   yes      Grab bars in the bathroom? yes      Handrails on the stairs?   yes      Adequate lighting?   yes   Depression Screen PHQ 2/9 Scores 06/27/2018 06/07/2017 01/29/2017 03/08/2016  PHQ - 2 Score 0 0 1 0  PHQ- 9 Score - - 3 0     Cognitive Function MMSE - Mini Mental State Exam 06/27/2018 06/07/2017  Orientation to time 5 5  Orientation to Place 5 5  Registration 3 3  Attention/ Calculation 5 5  Recall 3 3  Language- name 2 objects 2 2  Language- repeat 1 1  Language- follow 3 step  command 3 3  Language- read & follow direction 1 1  Write a sentence 1 1  Copy design 1 1  Total score 30 30        Immunization History  Administered Date(s) Administered  . Influenza,inj,Quad PF,6+ Mos 04/07/2016, 06/07/2017  . Pneumococcal Conjugate-13 06/07/2017  . Pneumococcal Polysaccharide-23 06/27/2018  . Td 07/11/2017    Qualifies for Shingles Vaccine? Yes - Declines  Screening Tests Health Maintenance  Topic Date Due  . DEXA SCAN  07/27/2016  . PNA vac Low Risk Adult (2 of 2 - PPSV23) 06/07/2018  . DTaP/Tdap/Td (1 - Tdap) 09/20/2018 (Originally 07/27/1961)  . INFLUENZA VACCINE  10/08/2018 (Originally 02/07/2018)  . COLONOSCOPY  07/11/2019  . MAMMOGRAM  05/13/2020  . TETANUS/TDAP  07/12/2027  . Hepatitis C Screening  Completed    Cancer Screenings: Lung: Low Dose CT Chest recommended if Age 40-80 years, 30 pack-year currently smoking OR have quit w/in 15years. Patient does not qualify. Breast:  Up to date on Mammogram? Yes   Up to date of Bone Density/Dexa? No     Plan:   Medicare Wellness, Subsequent  I have personally reviewed and noted the following in the patient's chart:   . Medical and social history . Use of alcohol, tobacco or illicit drugs  . Current medications and supplements . Functional ability and status . Nutritional status . Physical activity . Advanced directives - completed. Will bring in copies . List of other physicians . Hospitalizations, surgeries, and ER visits in previous 12 months . Vitals . Screenings to include cognitive, depression, and falls . Referrals and appointments   Immunization History  Administered Date(s) Administered  . Influenza,inj,Quad PF,6+ Mos 04/07/2016, 06/07/2017  . Pneumococcal Conjugate-13 06/07/2017  . Pneumococcal Polysaccharide-23 06/27/2018  . Td 07/11/2017   Bone density screen ordered.  In addition, I have reviewed and discussed with patient certain preventive  protocols, quality metrics, and  best practice recommendations. A written personalized care plan for preventive services as well as general preventive health recommendations were provided to patient.     Leeanne Rio, PA-C  06/27/2018

## 2018-06-27 NOTE — Patient Instructions (Addendum)
Please continue medications as directed.  Start a nightly 5 mg dose of Melatonin. Try this for a few nights. If not improving sleep, start the [place] taking as directed.  You will be contacted for your bone density scan (screening for osteoporosis). Take an OTC vitamin D3 2000 unit supplement daily.    Preventive Care 14 Years and Older, Female Preventive care refers to lifestyle choices and visits with your health care provider that can promote health and wellness. What does preventive care include?  A yearly physical exam. This is also called an annual well check.  Dental exams once or twice a year.  Routine eye exams. Ask your health care provider how often you should have your eyes checked.  Personal lifestyle choices, including: ? Daily care of your teeth and gums. ? Regular physical activity. ? Eating a healthy diet. ? Avoiding tobacco and drug use. ? Limiting alcohol use. ? Practicing safe sex. ? Taking low-dose aspirin every day. ? Taking vitamin and mineral supplements as recommended by your health care provider. What happens during an annual well check? The services and screenings done by your health care provider during your annual well check will depend on your age, overall health, lifestyle risk factors, and family history of disease. Counseling Your health care provider may ask you questions about your:  Alcohol use.  Tobacco use.  Drug use.  Emotional well-being.  Home and relationship well-being.  Sexual activity.  Eating habits.  History of falls.  Memory and ability to understand (cognition).  Work and work Statistician.  Reproductive health.  Screening You may have the following tests or measurements:  Height, weight, and BMI.  Blood pressure.  Lipid and cholesterol levels. These may be checked every 5 years, or more frequently if you are over 74 years old.  Skin check.  Lung cancer screening. You may have this screening every  year starting at age 51 if you have a 30-pack-year history of smoking and currently smoke or have quit within the past 15 years.  Colorectal cancer screening. All adults should have this screening starting at age 37 and continuing until age 75. You will have tests every 1-10 years, depending on your results and the type of screening test. People at increased risk should start screening at an earlier age. Screening tests may include: ? Guaiac-based fecal occult blood testing. ? Fecal immunochemical test (FIT). ? Stool DNA test. ? Virtual colonoscopy. ? Sigmoidoscopy. During this test, a flexible tube with a tiny camera (sigmoidoscope) is used to examine your rectum and lower colon. The sigmoidoscope is inserted through your anus into your rectum and lower colon. ? Colonoscopy. During this test, a long, thin, flexible tube with a tiny camera (colonoscope) is used to examine your entire colon and rectum.  Hepatitis C blood test.  Hepatitis B blood test.  Sexually transmitted disease (STD) testing.  Diabetes screening. This is done by checking your blood sugar (glucose) after you have not eaten for a while (fasting). You may have this done every 1-3 years.  Bone density scan. This is done to screen for osteoporosis. You may have this done starting at age 62.  Mammogram. This may be done every 1-2 years. Talk to your health care provider about how often you should have regular mammograms. Talk with your health care provider about your test results, treatment options, and if necessary, the need for more tests. Vaccines Your health care provider may recommend certain vaccines, such as:  Influenza vaccine. This is recommended  every year.  Tetanus, diphtheria, and acellular pertussis (Tdap, Td) vaccine. You may need a Td booster every 10 years.  Varicella vaccine. You may need this if you have not been vaccinated.  Zoster vaccine. You may need this after age 72.  Measles, mumps, and rubella  (MMR) vaccine. You may need at least one dose of MMR if you were born in 1957 or later. You may also need a second dose.  Pneumococcal 13-valent conjugate (PCV13) vaccine. One dose is recommended after age 7.  Pneumococcal polysaccharide (PPSV23) vaccine. One dose is recommended after age 63.  Meningococcal vaccine. You may need this if you have certain conditions.  Hepatitis A vaccine. You may need this if you have certain conditions or if you travel or work in places where you may be exposed to hepatitis A.  Hepatitis B vaccine. You may need this if you have certain conditions or if you travel or work in places where you may be exposed to hepatitis B.  Haemophilus influenzae type b (Hib) vaccine. You may need this if you have certain conditions. Talk to your health care provider about which screenings and vaccines you need and how often you need them. This information is not intended to replace advice given to you by your health care provider. Make sure you discuss any questions you have with your health care provider. Document Released: 07/23/2015 Document Revised: 08/16/2017 Document Reviewed: 04/27/2015 Elsevier Interactive Patient Education  2019 [employer].

## 2018-06-28 ENCOUNTER — Encounter: Payer: Self-pay | Admitting: Physician Assistant

## 2018-07-05 ENCOUNTER — Encounter: Payer: Self-pay | Admitting: Physician Assistant

## 2018-07-18 DIAGNOSIS — L853 Xerosis cutis: Secondary | ICD-10-CM | POA: Diagnosis not present

## 2018-07-18 DIAGNOSIS — L570 Actinic keratosis: Secondary | ICD-10-CM | POA: Diagnosis not present

## 2018-07-18 DIAGNOSIS — L718 Other rosacea: Secondary | ICD-10-CM | POA: Diagnosis not present

## 2018-07-18 DIAGNOSIS — D1801 Hemangioma of skin and subcutaneous tissue: Secondary | ICD-10-CM | POA: Diagnosis not present

## 2018-07-18 DIAGNOSIS — L821 Other seborrheic keratosis: Secondary | ICD-10-CM | POA: Diagnosis not present

## 2018-09-03 DIAGNOSIS — H04123 Dry eye syndrome of bilateral lacrimal glands: Secondary | ICD-10-CM | POA: Diagnosis not present

## 2018-09-03 DIAGNOSIS — H52223 Regular astigmatism, bilateral: Secondary | ICD-10-CM | POA: Diagnosis not present

## 2018-09-03 DIAGNOSIS — H5213 Myopia, bilateral: Secondary | ICD-10-CM | POA: Diagnosis not present

## 2018-09-03 DIAGNOSIS — H2513 Age-related nuclear cataract, bilateral: Secondary | ICD-10-CM | POA: Diagnosis not present

## 2018-09-03 DIAGNOSIS — H524 Presbyopia: Secondary | ICD-10-CM | POA: Diagnosis not present

## 2018-09-03 DIAGNOSIS — H35413 Lattice degeneration of retina, bilateral: Secondary | ICD-10-CM | POA: Diagnosis not present

## 2018-10-23 ENCOUNTER — Encounter: Payer: Self-pay | Admitting: Physician Assistant

## 2018-10-24 ENCOUNTER — Encounter: Payer: Self-pay | Admitting: Physician Assistant

## 2018-10-24 ENCOUNTER — Ambulatory Visit (INDEPENDENT_AMBULATORY_CARE_PROVIDER_SITE_OTHER): Payer: Medicare Other | Admitting: Physician Assistant

## 2018-10-24 ENCOUNTER — Other Ambulatory Visit: Payer: Self-pay

## 2018-10-24 VITALS — BP 122/84 | HR 81 | Ht 70.0 in | Wt 335.0 lb

## 2018-10-24 DIAGNOSIS — R609 Edema, unspecified: Secondary | ICD-10-CM | POA: Diagnosis not present

## 2018-10-24 MED ORDER — FUROSEMIDE 20 MG PO TABS: 20.0000 mg | ORAL_TABLET | Freq: Every day | ORAL | 0 refills | Status: DC

## 2018-10-24 NOTE — Progress Notes (Signed)
Virtual Visit via Video   I connected with Leslie Knapp on 10/24/18 at  8:15 AM EDT by a video enabled telemedicine application and verified that I am speaking with the correct person using two identifiers.  Location patient: Home Location provider: [facility] Persons participating in the virtual visit: Patient, Provider, [place] (Patina Moore)  I discussed the limitations of evaluation and management by telemedicine and the availability of in person appointments. The patient expressed understanding and agreed to proceed.  Subjective:   HPI:        Patient endorses a couple of episodes of intermittent swelling of bilateral lower extremities over the past couple of weeks, with more consistent swelling over the past 2 days. Notes this is alleviated with elevation, at least to some extent. Denies any SOB, PND or orthopnea. Denies chest pain or racing heart. Is trying to keep hydrated. Has been more sedentary due to stay-at-home order. On question about diet she does not she started a new diet program 3 weeks ago, called [employer]. She eats pre-packaged foods and shakes on this plan. On further review of these items she is getting in excess of 2500mg  sodium per day which is a large increase for her.   ROS:        See pertinent positives and negatives per HPI.  Patient Active Problem List   Diagnosis Date Noted  . Pure hypercholesterolemia 09/25/2017  . Morbid obesity ([place]) 06/07/2017  . Encounter for Medicare annual wellness exam 06/07/2017  . Vitamin D deficiency 06/07/2017  . IBS (irritable bowel syndrome) 08/29/2016  . Migraine 08/29/2016  . Disorder of musculoskeletal system 03/23/2015  . Eye exam abnormal 03/23/2015  . NASH (nonalcoholic steatohepatitis) 10/19/2011    Social History   Tobacco Use  . Smoking status: Never Smoker  . Smokeless tobacco: Never Used  Substance Use Topics  . Alcohol use: No    Alcohol/week: 0.0 standard drinks    Current  Outpatient Medications:  .  amoxicillin (AMOXIL) 500 MG tablet, Take 4 tablets 1 hour before dental appointment., Disp: , Rfl:  .  aspirin 81 MG tablet, Take 81 mg by mouth daily., Disp: , Rfl:  .  aspirin-acetaminophen-caffeine (EXCEDRIN MIGRAINE) 250-250-65 MG per tablet, Take 2 tablets by mouth every 6 (six) hours as needed for headache., Disp: , Rfl:  .  Calcium Carbonate 1500 (600 CA) MG TABS, Take 2 tablets by mouth daily., Disp: , Rfl:  .  Cholecalciferol (VITAMIN D-3) 1000 UNITS CAPS, Take 1 each by mouth daily., Disp: , Rfl:  .  Digestive Enzymes (ENZYME DIGEST) CAPS, Take by mouth., Disp: , Rfl:  .  diphenhydramine-acetaminophen (TYLENOL PM) 25-500 MG TABS, Take 2 tablets by mouth at bedtime as needed (Pain & Sleep)., Disp: , Rfl:  .  Diphenhydramine-APAP 25-500 MG/15ML LIQD, Take by mouth., Disp: , Rfl:  .  NON FORMULARY, ROLFING: Deep Tissue Massage, 2 Txs weekly as needed for joint pain, Disp: , Rfl:  .  Probiotic Product (PROBIOTIC-10 PO), Take 1 tablet by mouth daily., Disp: , Rfl:  .  Trace Min CaCrCuFeKMgMnPSeZn (MINERALS PO), Take 1 oz by mouth daily., Disp: , Rfl:  .  triamcinolone cream (KENALOG) 0.1 %, Apply 1 application topically 2 (two) times daily., Disp: 30 g, Rfl: 0 .  atorvastatin (LIPITOR) 10 MG tablet, Take by mouth., Disp: , Rfl:  .  Suvorexant (BELSOMRA) 10 MG TABS, Take 10 mg by mouth at bedtime. (Patient not taking: Reported on 10/24/2018), Disp: 30 tablet, Rfl: 0  Allergies  Allergen Reactions  . Lactose Intolerance (Gi)   . Celebrex [Celecoxib] Swelling and Rash    Objective:   BP 122/84   Pulse 81   Ht 5\' 10"  (1.778 m)   Wt (!) 335 lb (152 kg)   BMI 48.07 kg/m   Patient is well-developed, well-nourished in no acute distress.  Resting comfortably in chair at home.  Head is normocephalic, atraumatic.  No labored breathing.  Speech is clear and coherent with logical contest.  Patient is alert and oriented at baseline.  Bilateral pitting edema  noted of lower extremities with patient assistance. There is slight erythema of shins bilaterally without weeping, skin breakdown. No induration per patient.   Assessment and Plan:   1. Morbid obesity ([place]) 2. Peripheral edema  Recently starting new diet plan that is extremely high in sodium due to processed foods. This increase in sodium is most likely etiology of #2. She is to stop the processed foods, reverting back to low calorie diet. Limit saturated fats and simple carb, favoring lean protein, vegetables and MUFAs. Elevate legs while resting. KEep up and moving as being sedentary is not helping things. Rx Furosemide to take daily over the next 4-5 days. She is to keep close eye on weight. Thankfully this is currently stable. MyChart instructions sent. Follow-up Monday via video visit.  Strict ER precautions reviewed with patient who voiced understanding and agreement.     Leeanne Rio, PA-C 10/24/2018

## 2018-10-24 NOTE — Patient Instructions (Signed)
Instructions sent to MyChart.  It sounds like symptoms are stemming from the significant increase in sodium intake with these meals from [employer]. I want you to go back to home cooked foods the rest of the week and weekend, limiting salt intake. Increase water intake. Keep up and active but when resting make sure to elevate your legs.  Take the Furosemide once daily for swelling for the rest of the week.  We will follow-up on Monday for video visit. Please let me know what time would work for you.  If you note any worsening of symptoms or any increased pain/redness in the legs, let me know ASAP. If you have any difficulty breathing, you will need to go to the ER.

## 2018-10-24 NOTE — Progress Notes (Signed)
I have discussed the procedure for the virtual visit with the patient who has given consent to proceed with assessment and treatment.   Neisha Hinger S Prentis Langdon, CMA     

## 2018-10-28 ENCOUNTER — Encounter: Payer: Self-pay | Admitting: Physician Assistant

## 2018-10-28 ENCOUNTER — Other Ambulatory Visit: Payer: Self-pay

## 2018-10-28 ENCOUNTER — Ambulatory Visit (INDEPENDENT_AMBULATORY_CARE_PROVIDER_SITE_OTHER): Payer: Medicare Other | Admitting: Physician Assistant

## 2018-10-28 VITALS — BP 136/80 | HR 74 | Ht 70.0 in | Wt 333.0 lb

## 2018-10-28 DIAGNOSIS — R609 Edema, unspecified: Secondary | ICD-10-CM

## 2018-10-28 LAB — CBC
HCT: 46.3 % — ABNORMAL HIGH (ref 36.0–46.0)
Hemoglobin: 15.9 g/dL — ABNORMAL HIGH (ref 12.0–15.0)
MCHC: 34.3 g/dL (ref 30.0–36.0)
MCV: 88.3 fl (ref 78.0–100.0)
Platelets: 206 10*3/uL (ref 150.0–400.0)
RBC: 5.25 Mil/uL — ABNORMAL HIGH (ref 3.87–5.11)
RDW: 13.3 % (ref 11.5–15.5)
WBC: 5.4 10*3/uL (ref 4.0–10.5)

## 2018-10-28 LAB — COMPREHENSIVE METABOLIC PANEL
ALT: 22 U/L (ref 0–35)
AST: 20 U/L (ref 0–37)
Albumin: 4.2 g/dL (ref 3.5–5.2)
Alkaline Phosphatase: 97 U/L (ref 39–117)
BUN: 11 mg/dL (ref 6–23)
CO2: 31 mEq/L (ref 19–32)
Calcium: 9.8 mg/dL (ref 8.4–10.5)
Chloride: 99 mEq/L (ref 96–112)
Creatinine, Ser: 0.69 mg/dL (ref 0.40–1.20)
GFR: 84.8 mL/min (ref >60.00)
Glucose, Bld: 109 mg/dL — ABNORMAL HIGH (ref 70–99)
Potassium: 4.6 mEq/L (ref 3.5–5.1)
Sodium: 137 mEq/L (ref 135–145)
Total Bilirubin: 0.7 mg/dL (ref 0.2–1.2)
Total Protein: 6.7 g/dL (ref 6.0–8.3)

## 2018-10-28 NOTE — Patient Instructions (Signed)
Instructions sent to MyChart.   Continue avoidance of processed foods high in salt. Keep hydrated but don't go overboard with this! Elevate legs while resting. I would recommend wearing the compression stockings in the day. You can also wear at night but you need to take them off every 8 hours to let the skin breathe for 30 minutes or so.  Continue the Furosemide at current dose for now.  We will see you shortly for labs and will make changes based on those results.

## 2018-10-28 NOTE — Progress Notes (Signed)
Virtual Visit via Video   I connected with patient on 10/28/18 at  8:30 AM EDT by a video enabled telemedicine application and verified that I am speaking with the correct person using two identifiers.  Location patient: Home Location provider: [facility], Office Persons participating in the virtual visit: Patient, Provider, [place] (Patina Moore)  I discussed the limitations of evaluation and management by telemedicine and the availability of in person appointments. The patient expressed understanding and agreed to proceed.  Subjective:   HPI:   Patient presents via Doxy[employer]me for follow-up of peripheral edema. At last visit, patient was instructed to stop her Medi[employer] meals/shakes (getting in 3000 mg sodium daily). Was given Furosemide 20 mg to use daily over the weekend. Instructed to move around as much as possible and elevate legs while resting. She endorses following instructions as directed. Also started wearing compression stockings at night while asleep. Notes legs are slowly improving. Denies increased redness or tenderness, noting this is improving as well. Again denies racing heart, SOB, PND or orthopnea. Notes family history of varicose veins but has not notes this issue personally.   ROS:   See pertinent positives and negatives per HPI.  Patient Active Problem List   Diagnosis Date Noted  . Pure hypercholesterolemia 09/25/2017  . Morbid obesity ([place]) 06/07/2017  . Encounter for Medicare annual wellness exam 06/07/2017  . Vitamin D deficiency 06/07/2017  . IBS (irritable bowel syndrome) 08/29/2016  . Migraine 08/29/2016  . Disorder of musculoskeletal system 03/23/2015  . Eye exam abnormal 03/23/2015  . NASH (nonalcoholic steatohepatitis) 10/19/2011    Social History   Tobacco Use  . Smoking status: Never Smoker  . Smokeless tobacco: Never Used  Substance Use Topics  . Alcohol use: No    Alcohol/week: 0.0 standard drinks    Current Outpatient Medications:   .  amoxicillin (AMOXIL) 500 MG tablet, Take 4 tablets 1 hour before dental appointment., Disp: , Rfl:  .  aspirin 81 MG tablet, Take 81 mg by mouth daily., Disp: , Rfl:  .  aspirin-acetaminophen-caffeine (EXCEDRIN MIGRAINE) 250-250-65 MG per tablet, Take 2 tablets by mouth every 6 (six) hours as needed for headache., Disp: , Rfl:  .  atorvastatin (LIPITOR) 10 MG tablet, Take by mouth., Disp: , Rfl:  .  Calcium Carbonate 1500 (600 CA) MG TABS, Take 2 tablets by mouth daily., Disp: , Rfl:  .  Cholecalciferol (VITAMIN D-3) 1000 UNITS CAPS, Take 1 each by mouth daily., Disp: , Rfl:  .  Digestive Enzymes (ENZYME DIGEST) CAPS, Take by mouth., Disp: , Rfl:  .  diphenhydramine-acetaminophen (TYLENOL PM) 25-500 MG TABS, Take 2 tablets by mouth at bedtime as needed (Pain & Sleep)., Disp: , Rfl:  .  Diphenhydramine-APAP 25-500 MG/15ML LIQD, Take by mouth., Disp: , Rfl:  .  furosemide (LASIX) 20 MG tablet, Take 1 tablet (20 mg total) by mouth daily., Disp: 15 tablet, Rfl: 0 .  NON FORMULARY, ROLFING: Deep Tissue Massage, 2 Txs weekly as needed for joint pain, Disp: , Rfl:  .  Probiotic Product (PROBIOTIC-10 PO), Take 1 tablet by mouth daily., Disp: , Rfl:  .  Trace Min CaCrCuFeKMgMnPSeZn (MINERALS PO), Take 1 oz by mouth daily., Disp: , Rfl:  .  triamcinolone cream (KENALOG) 0.1 %, Apply 1 application topically 2 (two) times daily., Disp: 30 g, Rfl: 0  Allergies  Allergen Reactions  . Lactose Intolerance (Gi)   . Celebrex [Celecoxib] Swelling and Rash    Objective:   BP 116/80  Pulse 74   Ht _0  (1.778 m)   Wt (!) 333 lb (151 kg)   BMI 47.78 kg/m   Patient is well-developed, well-nourished in no acute distress.  Resting comfortably in chair at home.  Head is normocephalic, atraumatic.  No labored breathing.  Speech is clear and coherent with logical contest.  Patient is alert and oriented at baseline.   Media Information   Document Information   Patient Upload:  Patient Entered  Attachment  [id number]  10/28/2018  Attached To:  Patient Message on 10/28/18 with Brunetta Jeans, PA-C  Source Information   Mychart, Generic     Assessment and Plan:    1. Peripheral edema Swelling slowly improving. Picture attached is a noted improvement since last visit. Pedal edema still moderate. Denies pain or tenderness to indicate potential cellulitis. Continue Furosemide 20 mg for now. Can consider increase short-term to 40 mg but will check CMP, CBC first. Continue avoidance of processed foods. Compression stockings during the day. Can wear at night but needs to take off for at least 30 minutes every 8 hours to let skin breathe. Elevate legs while resting. Will alter regimen once labs are in.   - Comp Met (CMET) - CBC   Leeanne Rio, [place] 10/28/2018

## 2018-10-28 NOTE — Progress Notes (Signed)
I have discussed the procedure for the virtual visit with the patient who has given consent to proceed with assessment and treatment.   Ripley Bogosian S Izak Anding, CMA     

## 2018-10-29 ENCOUNTER — Encounter: Payer: Self-pay | Admitting: Physician Assistant

## 2018-11-04 ENCOUNTER — Encounter: Payer: Self-pay | Admitting: Physician Assistant

## 2018-11-08 ENCOUNTER — Ambulatory Visit (INDEPENDENT_AMBULATORY_CARE_PROVIDER_SITE_OTHER): Payer: Medicare Other | Admitting: Physician Assistant

## 2018-11-08 ENCOUNTER — Encounter: Payer: Self-pay | Admitting: Physician Assistant

## 2018-11-08 ENCOUNTER — Other Ambulatory Visit: Payer: Self-pay

## 2018-11-08 VITALS — BP 117/70 | HR 74 | Temp 98.3°F | Ht 70.0 in | Wt 332.0 lb

## 2018-11-08 DIAGNOSIS — R609 Edema, unspecified: Secondary | ICD-10-CM | POA: Diagnosis not present

## 2018-11-08 NOTE — Progress Notes (Signed)
I have discussed the procedure for the virtual visit with the patient who has given consent to proceed with assessment and treatment.   Oluchi Pucci S Felecia Stanfill, CMA     

## 2018-11-08 NOTE — Patient Instructions (Signed)
Instructions sent to MyChart

## 2018-11-08 NOTE — Progress Notes (Signed)
Virtual Visit via Video   I connected with patient on 11/08/18 at  8:20 AM EDT by a video enabled telemedicine application and verified that I am speaking with the correct person using two identifiers.  Location patient: Home Location provider: [facility], Office Persons participating in the virtual visit: Patient, Provider, [place] (Patina Moore)  I discussed the limitations of evaluation and management by telemedicine and the availability of in person appointments. The patient expressed understanding and agreed to proceed.  Subjective:   HPI:   Patient presents via Doxy.Me to follow-up of peripheral edema. Patient thus far has made significant changes to diet; really cutting back on sodium content. Was placed on Furosemide 20 mg which she had been taking daily with an appreciable improvement in her legs/feet. Since last visit, patient notes continued improvement. Her legs are much better but notes still issue with pedal edema. Is working harder on keeping up and moving around and using a special pillow to raise legs above heart while resting. Is making sure to take a couple of laps around the house every hour or so at the least from 6 AM to bedtime.  Is only wearing compression stocking some of the time. Note BP looking good at home. Denies new concerns.   ROS:   See pertinent positives and negatives per HPI.  Patient Active Problem List   Diagnosis Date Noted  . Pure hypercholesterolemia 09/25/2017  . Morbid obesity ([place]) 06/07/2017  . Encounter for Medicare annual wellness exam 06/07/2017  . Vitamin D deficiency 06/07/2017  . IBS (irritable bowel syndrome) 08/29/2016  . Migraine 08/29/2016  . Disorder of musculoskeletal system 03/23/2015  . Eye exam abnormal 03/23/2015  . NASH (nonalcoholic steatohepatitis) 10/19/2011    Social History   Tobacco Use  . Smoking status: Never Smoker  . Smokeless tobacco: Never Used  Substance Use Topics  . Alcohol use: No   Alcohol/week: 0.0 standard drinks    Current Outpatient Medications:  .  amoxicillin (AMOXIL) 500 MG tablet, Take 4 tablets 1 hour before dental appointment., Disp: , Rfl:  .  aspirin 81 MG tablet, Take 81 mg by mouth daily., Disp: , Rfl:  .  aspirin-acetaminophen-caffeine (EXCEDRIN MIGRAINE) 250-250-65 MG per tablet, Take 2 tablets by mouth every 6 (six) hours as needed for headache., Disp: , Rfl:  .  atorvastatin (LIPITOR) 10 MG tablet, Take by mouth., Disp: , Rfl:  .  Calcium Carbonate 1500 (600 CA) MG TABS, Take 2 tablets by mouth daily., Disp: , Rfl:  .  Cholecalciferol (VITAMIN D-3) 1000 UNITS CAPS, Take 1 each by mouth daily., Disp: , Rfl:  .  Digestive Enzymes (ENZYME DIGEST) CAPS, Take by mouth., Disp: , Rfl:  .  diphenhydramine-acetaminophen (TYLENOL PM) 25-500 MG TABS, Take 2 tablets by mouth at bedtime as needed (Pain & Sleep)., Disp: , Rfl:  .  Diphenhydramine-APAP 25-500 MG/15ML LIQD, Take by mouth., Disp: , Rfl:  .  furosemide (LASIX) 20 MG tablet, Take 1 tablet (20 mg total) by mouth daily., Disp: 15 tablet, Rfl: 0 .  NON FORMULARY, ROLFING: Deep Tissue Massage, 2 Txs weekly as needed for joint pain, Disp: , Rfl:  .  Probiotic Product (PROBIOTIC-10 PO), Take 1 tablet by mouth daily., Disp: , Rfl:  .  Trace Min CaCrCuFeKMgMnPSeZn (MINERALS PO), Take 1 oz by mouth daily., Disp: , Rfl:  .  triamcinolone cream (KENALOG) 0.1 %, Apply 1 application topically 2 (two) times daily., Disp: 30 g, Rfl: 0  Allergies  Allergen Reactions  .  Lactose Intolerance (Gi)   . Celebrex [Celecoxib] Swelling and Rash    Objective:   BP 117/70   Pulse 74   Temp 98.3 F (36.8 C) (Oral)   Ht 5\' 10"  (1.778 m)   Wt (!) 332 lb (150.6 kg)   BMI 47.64 kg/m   Patient is well-developed, well-nourished in no acute distress.  Resting comfortably in chair at home.  Head is normocephalic, atraumatic.  No labored breathing.  Speech is clear and coherent with logical contest.  Patient is alert and  oriented at baseline.  Mild pedal edema noted on exam through video. Non pitting. No significant erythema on examination.   Assessment and Plan:    1. Peripheral edema Continue salt-restriction, Furosemide and compression stockings. She is to continue building up the amount she is moving around per day. Order placed for US venous reflux to assess for valvular insufficiencies that may be contributing to symptoms. Follow-up via MyChart in 10-14 days.    Leeanne Rio, PA-C 11/08/2018

## 2018-11-13 ENCOUNTER — Encounter: Payer: Self-pay | Admitting: Physician Assistant

## 2018-11-13 ENCOUNTER — Other Ambulatory Visit: Payer: Self-pay | Admitting: Physician Assistant

## 2018-11-28 ENCOUNTER — Encounter: Payer: Self-pay | Admitting: Physician Assistant

## 2018-12-04 ENCOUNTER — Other Ambulatory Visit: Payer: Self-pay

## 2018-12-04 ENCOUNTER — Ambulatory Visit (HOSPITAL_COMMUNITY)
Admission: RE | Admit: 2018-12-04 | Discharge: 2018-12-04 | Disposition: A | Discharge status: Home / Self Care | Payer: Medicare Other | Source: Ambulatory Visit | Attending: Physician Assistant | Admitting: Physician Assistant

## 2018-12-04 DIAGNOSIS — R609 Edema, unspecified: Secondary | ICD-10-CM | POA: Insufficient documentation

## 2019-01-09 ENCOUNTER — Encounter: Payer: Self-pay | Admitting: Physician Assistant

## 2019-01-11 ENCOUNTER — Encounter: Payer: Self-pay | Admitting: Physician Assistant

## 2019-01-21 ENCOUNTER — Encounter: Payer: Self-pay | Admitting: Physician Assistant

## 2019-02-21 ENCOUNTER — Ambulatory Visit (INDEPENDENT_AMBULATORY_CARE_PROVIDER_SITE_OTHER): Payer: Medicare Other | Admitting: Physician Assistant

## 2019-02-21 ENCOUNTER — Encounter: Payer: Self-pay | Admitting: Physician Assistant

## 2019-02-21 ENCOUNTER — Other Ambulatory Visit: Payer: Self-pay

## 2019-02-21 VITALS — BP 122/82 | HR 79 | Temp 98.1°F | Resp 16 | Ht 70.0 in | Wt 335.0 lb

## 2019-02-21 DIAGNOSIS — B354 Tinea corporis: Secondary | ICD-10-CM

## 2019-02-21 DIAGNOSIS — R600 Localized edema: Secondary | ICD-10-CM

## 2019-02-21 DIAGNOSIS — R609 Edema, unspecified: Secondary | ICD-10-CM

## 2019-02-21 MED ORDER — CLOTRIMAZOLE-BETAMETHASONE 1-0.05 % EX CREA: 1.0000 "application " | TOPICAL_CREAM | Freq: Two times a day (BID) | CUTANEOUS | 0 refills | Status: DC

## 2019-02-21 MED ORDER — FUROSEMIDE 20 MG PO TABS: 20.0000 mg | ORAL_TABLET | Freq: Every day | ORAL | 3 refills | Status: DC

## 2019-02-21 NOTE — Progress Notes (Signed)
Patient presents to clinic today c/o red area of skin of lateral right breast present x2 weeks.  Is sometimes itchy and scaly.  Is nonpainful.  Has gotten a little larger since onset.  Denies trauma or injury.  Denies similar lesion elsewhere.  Is up-to-date on mammogram.  Denies sick contact.  Denies change in soaps, lotions or detergents.  Has not tried to apply anything to the area.  Patient also following up regarding chronic peripheral edema.  Is following low-salt diet and trying to keep more active.  Elevating legs while resting.  Does not wear compression stockings as directed.  Is getting Rohlfing massages a couple times per week which seems to help.  Patient no longer taking her furosemide.  Would like to discuss restarting medication.   Past Medical History:  Diagnosis Date  . CTS (carpal tunnel syndrome)   . Fatty liver disease, nonalcoholic    In [facility] Study  . IBS (irritable bowel syndrome)    C/D  . Morbid obesity ([place])   . Osteoarthritis     Current Outpatient Medications on File Prior to Visit  Medication Sig Dispense Refill  . amoxicillin (AMOXIL) 500 MG tablet Take 4 tablets 1 hour before dental appointment.    Marland Kitchen aspirin-acetaminophen-caffeine (EXCEDRIN MIGRAINE) 250-250-65 MG per tablet Take 2 tablets by mouth every 6 (six) hours as needed for headache.    Marland Kitchen atorvastatin (LIPITOR) 10 MG tablet Take by mouth.    . Calcium Carbonate 1500 (600 CA) MG TABS Take 2 tablets by mouth daily.    . Cholecalciferol (VITAMIN D-3) 1000 UNITS CAPS Take 1 each by mouth daily.    . Digestive Enzymes (ENZYME DIGEST) CAPS Take by mouth.    . diphenhydramine-acetaminophen (TYLENOL PM) 25-500 MG TABS Take 2 tablets by mouth at bedtime as needed (Pain & Sleep).    . Diphenhydramine-APAP 25-500 MG/15ML LIQD Take by mouth.    . NON FORMULARY ROLFING: Deep Tissue Massage, 2 Txs weekly as needed for joint pain    . Probiotic Product (PROBIOTIC-10 PO) Take 1 tablet by mouth daily.    Colbert Coyer  Min CaCrCuFeKMgMnPSeZn (MINERALS PO) Take 1 oz by mouth daily.    . furosemide (LASIX) 20 MG tablet Take 1 tablet by mouth once daily (Patient not taking: Reported on 02/21/2019) 15 tablet 0   No current facility-administered medications on file prior to visit.     Allergies  Allergen Reactions  . Lactose Intolerance (Gi)   . Celebrex [Celecoxib] Swelling and Rash    Family History  Problem Relation Age of Onset  . Stroke Mother 45       Deceased  . Hypertension Mother   . GI problems Mother   . Arthritis Mother   . Dementia Mother   . Heart defect Father 66       Deceased  . Parkinson's disease Father   . Varicose Veins Father   . Stroke Maternal Grandfather   . Arthritis Maternal Grandfather   . Kidney failure Maternal Grandmother   . Gallbladder disease Maternal Grandmother   . Diverticulitis Sister        #1  . Arthritis Sister   . Healthy Sister        #2    Social History   Socioeconomic History  . Marital status: Married    Spouse name: Not on file  . Number of children: Not on file  . Years of education: Not on file  . Highest education level: Not on file  Occupational History  . Not on file  Social Needs  . Financial resource strain: Not on file  . Food insecurity    Worry: Not on file    Inability: Not on file  . Transportation needs    Medical: Not on file    Non-medical: Not on file  Tobacco Use  . Smoking status: Never Smoker  . Smokeless tobacco: Never Used  Substance and Sexual Activity  . Alcohol use: No    Alcohol/week: 0.0 standard drinks  . Drug use: No  . Sexual activity: Never  Lifestyle  . Physical activity    Days per week: Not on file    Minutes per session: Not on file  . Stress: Not on file  Relationships  . Social Herbalist on phone: Not on file    Gets together: Not on file    Attends religious service: Not on file    Active member of club or organization: Not on file    Attends meetings of clubs or  organizations: Not on file    Relationship status: Not on file  Other Topics Concern  . Not on file  Social History Narrative  . Not on file   Review of Systems - See HPI.  All other ROS are negative.  BP 122/82   Pulse 79   Temp 98.1 F (36.7 C) (Skin)   Resp 16   Ht 5\' 10"  (1.778 m)   Wt (!) 335 lb (152 kg)   SpO2 98%   BMI 48.07 kg/m   Physical Exam Vitals signs reviewed.  Constitutional:      Appearance: Normal appearance.  HENT:     Head: Normocephalic and atraumatic.  Neck:     Musculoskeletal: Neck supple.  Cardiovascular:     Rate and Rhythm: Normal rate and regular rhythm.     Pulses: Normal pulses.     Heart sounds: Normal heart sounds.     Comments: 1+ peripheral edema noted of bilateral lower extremities up to the level of mid shin.  No evidence of venous stasis dermatitis or ulceration. Pulmonary:     Effort: Pulmonary effort is normal.     Breath sounds: Normal breath sounds.  Chest:    Neurological:     Mental Status: She is alert.  Psychiatric:        Mood and Affect: Mood normal.     Assessment/Plan: 1. Tinea corporis Examination seems most consistent with very mild case of tinea corporis.  Will start Lotrisone cream twice daily for 2 weeks.  Supportive measures reviewed.  Patient to let us know if symptoms are not resolving, if anything worsens, or if new symptoms develop as this would warrant need for further assessment. - clotrimazole-betamethasone (LOTRISONE) cream; Apply 1 application topically 2 (two) times daily. For up to 2 weeks.  Dispense: 30 g; Refill: 0  2. Peripheral edema Very mild today.  Overall significantly improved with change in diet and lifestyle.  Recommend she continue the Rohlfing massages.  Will restart Lasix at 20 mg once daily to keep things under control.  She again declines assessment with vascular.  Vitals stable, lungs clear to auscultation bilaterally.  Will monitor closely.  Follow-up scheduled. - furosemide  (LASIX) 20 MG tablet; Take 1 tablet (20 mg total) by mouth daily.  Dispense: 30 tablet; Refill: 3   Leeanne Rio, [place]

## 2019-02-21 NOTE — Patient Instructions (Signed)
Continue DASH diet and keeping active. Continue Rolfing massages. Restart the Furosemide taking once per day. Increase dietary intake of potassium. Follow-up with me in 1 month.   Apply the prescription cream to the area twice daily for up to two weeks. Follow-up with me via MyChart in 1 week to let me know how symptoms are doing. If not improving we will get more in-depth assessment.    Body Ringworm Body ringworm is an infection of the skin that often causes a ring-shaped rash. Body ringworm is also called tinea corporis. Body ringworm can affect any part of your skin. This condition is easily spread from person to person (is very contagious). What are the causes? This condition is caused by fungi called dermatophytes. The condition develops when these fungi grow out of control on the skin. You can get this condition if you touch a person or animal that has it. You can also get it if you share any items with an infected person or pet. These include:  Clothing, bedding, and towels.  Brushes or combs.  Gym equipment.  Any other object that has the fungus on it. What increases the risk? You are more likely to develop this condition if you:  Play sports that involve close physical contact, such as wrestling.  Sweat a lot.  Live in areas that are hot and humid.  Use public showers.  Have a weakened immune system. What are the signs or symptoms? Symptoms of this condition include:  Itchy, raised red spots and bumps.  Red scaly patches.  A ring-shaped rash. The rash may have: ? A clear center. ? Scales or red bumps at its center. ? Redness near its borders. ? Dry and scaly skin on or around it. How is this diagnosed? This condition can usually be diagnosed with a skin exam. A skin scraping may be taken from the affected area and examined under a microscope to see if the fungus is present. How is this treated? This condition may be treated with:  An antifungal cream or  ointment.  An antifungal shampoo.  Antifungal medicines. These may be prescribed if your ringworm: ? Is severe. ? Keeps coming back. ? Lasts a long time. Follow these instructions at home:  Take over-the-counter and prescription medicines only as told by your health care provider.  If you were given an antifungal cream or ointment: ? Use it as told by your health care provider. ? Wash the infected area and dry it completely before applying the cream or ointment.  If you were given an antifungal shampoo: ? Use it as told by your health care provider. ? Leave the shampoo on your body for 3-5 minutes before rinsing.  While you have a rash: ? Wear loose clothing to stop clothes from rubbing and irritating it. ? Wash or change your bed sheets every night. ? Disinfect or throw out items that may be infected. ? Wash clothes and bed sheets in hot water. ? Wash your hands often with soap and water. If soap and water are not available, use hand sanitizer.  If your pet has the same infection, take your pet to see a veterinarian for treatment. How is this prevented?  Take a bath or shower every day and after every time you work out or play sports.  Dry your skin completely after bathing.  Wear sandals or shoes in public places and showers.  Change your clothes every day.  Wash athletic clothes after each use.  Do not share  personal items with others.  Avoid touching red patches of skin on other people.  Avoid touching pets that have bald spots.  If you touch an animal that has a bald spot, wash your hands. Contact a health care provider if:  Your rash continues to spread after 7 days of treatment.  Your rash is not gone in 4 weeks.  The area around your rash gets red, warm, tender, and swollen. Summary  Body ringworm is an infection of the skin that often causes a ring-shaped rash.  This condition is easily spread from person to person (is very contagious).  This  condition may be treated with antifungal cream or ointment, antifungal shampoo, or antifungal medicines.  Take over-the-counter and prescription medicines only as told by your health care provider. This information is not intended to replace advice given to you by your health care provider. Make sure you discuss any questions you have with your health care provider. Document Released: 06/23/2000 Document Revised: 02/22/2018 Document Reviewed: 02/22/2018 Elsevier Patient Education  2020 [employer].

## 2019-03-07 ENCOUNTER — Encounter: Payer: Self-pay | Admitting: Physician Assistant

## 2019-03-07 ENCOUNTER — Other Ambulatory Visit: Payer: Self-pay

## 2019-03-07 ENCOUNTER — Ambulatory Visit (INDEPENDENT_AMBULATORY_CARE_PROVIDER_SITE_OTHER): Payer: Medicare Other | Admitting: Physician Assistant

## 2019-03-07 ENCOUNTER — Ambulatory Visit (HOSPITAL_BASED_OUTPATIENT_CLINIC_OR_DEPARTMENT_OTHER)
Admission: RE | Admit: 2019-03-07 | Discharge: 2019-03-07 | Disposition: A | Discharge status: Home / Self Care | Payer: Medicare Other | Source: Ambulatory Visit | Attending: Physician Assistant | Admitting: Physician Assistant

## 2019-03-07 ENCOUNTER — Ambulatory Visit: Payer: Self-pay | Admitting: *Deleted

## 2019-03-07 DIAGNOSIS — K5900 Constipation, unspecified: Secondary | ICD-10-CM | POA: Diagnosis not present

## 2019-03-07 NOTE — Telephone Encounter (Signed)
  Pt called in c/o having constipation for 5 days.    She was recently started on Lasix for swelling in her feet.   "I've also not had much water".   See triage notes.  I warm transferred her call into Burns Martin's office to be scheduled to [place].  I sent my notes over.   Reason for Disposition . [1] Constipation persists > 1 week AND [2] no improvement after using CARE ADVICE  Answer Assessment - Initial Assessment Questions 1. STOOL PATTERN OR FREQUENCY: "How often do you pass bowel movements (BMs)?"  (Normal range: tid to q 3 days)  "When was the last BM passed?"       Been 5-6 days since had a BM.  No surgery recently or denies using opioid medications.    I think I'm just dehydrated.   I haven't had much water this past week.    I've tried a Fleets enema only got mucus out.   It was brown-yellow.   No blood.   2. STRAINING: "Do you have to strain to have a BM?"      Yes 3. RECTAL PAIN: "Does your rectum hurt when the stool comes out?" If so, ask: "Do you have hemorrhoids? How bad is the pain?"  (Scale 1-10; or mild, moderate, severe)     No    I have the urge to go to the bathroom but nothing comes out.    4. STOOL COMPOSITION: "Are the stools hard?"      Can't go 5. BLOOD ON STOOLS: "Has there been any blood on the toilet tissue or on the surface of the BM?" If so, ask: "When was the last time?"      No 6. CHRONIC CONSTIPATION: "Is this a new problem for you?"  If no, ask: "How long have you had this problem?" (days, weeks, months)      Yes. 7. CHANGES IN DIET: "Have there been any recent changes in your diet?"      No    8. MEDICATIONS: "Have you been taking any new medications?"     Lasix was started 20 mg once a day.   Started 2 wks ago.   I'm on it because my feet were swelling.   It helped. 9. LAXATIVES: "Have you been using any laxatives or enemas?"  If yes, ask "What, how often, and when was the last time?"     Fleets enema, Gentle move herb from the store. 10. CAUSE:  "What do you think is causing the constipation?"        I didn't drink enough water. 11. OTHER SYMPTOMS: "Do you have any other symptoms?" (e.g., abdominal pain, fever, vomiting)       No abd pain or bloating.   Just the urge to go. 12. PREGNANCY: "Is there any chance you are pregnant?" "When was your last menstrual period?"       N/A due to age  Protocols used: CONSTIPATION-A-AH

## 2019-03-07 NOTE — Progress Notes (Signed)
Virtual Visit via Video   I connected with patient on 03/07/19 at 11:30 AM EDT by a video enabled telemedicine application and verified that I am speaking with the correct person using two identifiers.  Location patient: Home Location provider: [facility] Office Persons participating in the virtual visit: Patient, Provider, [place] (Patina Moore)  I discussed the limitations of evaluation and management by telemedicine and the availability of in person appointments. The patient expressed understanding and agreed to proceed.  Subjective:   HPI:   Patient presents via [employer] today to discuss constipation over the past week.  Patient endorses hard stool and straining.  Notes bowel movements every couple days but has not had a bowel movement in 3 days currently.  Denies rectal pain, melena or hematochezia.  Is currently on furosemide 20 mg for peripheral edema which she has been taking as directed up until yesterday.  Notes poor fluid intake over the past week, citing she is just been very busy.  Denies any change to her diet.  Tries to keep a well-balanced diet overall.  Patient denies fever, chills, malaise.  Denies abdominal pain, nausea or vomiting.  Some flatus noted but very minimal.  ROS:   See pertinent positives and negatives per HPI.  Patient Active Problem List   Diagnosis Date Noted  . Pure hypercholesterolemia 09/25/2017  . Morbid obesity ([place]) 06/07/2017  . Encounter for Medicare annual wellness exam 06/07/2017  . Vitamin D deficiency 06/07/2017  . IBS (irritable bowel syndrome) 08/29/2016  . Migraine 08/29/2016  . Disorder of musculoskeletal system 03/23/2015  . Eye exam abnormal 03/23/2015  . NASH (nonalcoholic steatohepatitis) 10/19/2011    Social History   Tobacco Use  . Smoking status: Never Smoker  . Smokeless tobacco: Never Used  Substance Use Topics  . Alcohol use: No    Alcohol/week: 0.0 standard drinks    Current Outpatient Medications:  .   amoxicillin (AMOXIL) 500 MG tablet, Take 4 tablets 1 hour before dental appointment., Disp: , Rfl:  .  aspirin-acetaminophen-caffeine (EXCEDRIN MIGRAINE) 250-250-65 MG per tablet, Take 2 tablets by mouth every 6 (six) hours as needed for headache., Disp: , Rfl:  .  atorvastatin (LIPITOR) 10 MG tablet, Take by mouth., Disp: , Rfl:  .  Calcium Carbonate 1500 (600 CA) MG TABS, Take 2 tablets by mouth daily., Disp: , Rfl:  .  Cholecalciferol (VITAMIN D-3) 1000 UNITS CAPS, Take 1 each by mouth daily., Disp: , Rfl:  .  clotrimazole-betamethasone (LOTRISONE) cream, Apply 1 application topically 2 (two) times daily. For up to 2 weeks., Disp: 30 g, Rfl: 0 .  Digestive Enzymes (ENZYME DIGEST) CAPS, Take by mouth., Disp: , Rfl:  .  diphenhydramine-acetaminophen (TYLENOL PM) 25-500 MG TABS, Take 2 tablets by mouth at bedtime as needed (Pain & Sleep)., Disp: , Rfl:  .  Diphenhydramine-APAP 25-500 MG/15ML LIQD, Take by mouth., Disp: , Rfl:  .  furosemide (LASIX) 20 MG tablet, Take 1 tablet (20 mg total) by mouth daily., Disp: 30 tablet, Rfl: 3 .  NON FORMULARY, ROLFING: Deep Tissue Massage, 2 Txs weekly as needed for joint pain, Disp: , Rfl:  .  Probiotic Product (PROBIOTIC-10 PO), Take 1 tablet by mouth daily., Disp: , Rfl:  .  Trace Min CaCrCuFeKMgMnPSeZn (MINERALS PO), Take 1 oz by mouth daily., Disp: , Rfl:   Allergies  Allergen Reactions  . Lactose Intolerance (Gi)   . Celebrex [Celecoxib] Swelling and Rash    Objective:   There were no vitals taken for  this visit.  Patient is well-developed, well-nourished in no acute distress.  Resting comfortably at home.  Head is normocephalic, atraumatic.  No labored breathing.  Speech is clear and coherent with logical content.  Patient is alert and oriented at baseline.   Assessment and Plan:   1. Constipation, unspecified constipation type Suspect this is likely due to decreased fluid intake in conjunction with using a loop diuretic.  No residual  swelling in legs.  Continue DASH diet.  Hold Lasix for now.  Increase fluid intake.  Will obtain x-ray of abdomen to rule out any concern for bowel obstruction.  Constipation regimen reviewed with patient.  Handout sent to my chart.  Will alter treatment regimen based on stat x-ray results.  Strict ER precautions reviewed with patient and her sister who was also present for video visit.  They voiced understanding and agreement with the plan. - DG Abd 2 Views; Future .   Leeanne Rio, [place] 03/07/2019

## 2019-03-07 NOTE — Patient Instructions (Signed)
Please go to [facility] for imaging. We will call you with your results and alter treatment accordingly.  [address] [place], [place] [address]   I encourage you to increase hydration and the amount of fiber in your diet.  Start a daily probiotic (Align, Culturelle, Digestive Advantage, etc.). If no bowel movement within 24 hours, take 2 Tbs of Milk of Magnesia in a 4 oz glass of warmed prune juice every 2-3 days to help promote bowel movement. If no results within 24 hours, then repeat above regimen, adding a Dulcolax stool softener to regimen. If this does not promote a bowel movement, please call the office.  Keep off of the Furosemide for now. Increase fluids.

## 2019-03-08 ENCOUNTER — Emergency Department (HOSPITAL_BASED_OUTPATIENT_CLINIC_OR_DEPARTMENT_OTHER)
Admission: EM | Admit: 2019-03-08 | Discharge: 2019-03-08 | Disposition: A | Discharge status: Home / Self Care | Payer: Medicare Other | Attending: Emergency Medicine | Admitting: Emergency Medicine

## 2019-03-08 ENCOUNTER — Emergency Department (HOSPITAL_BASED_OUTPATIENT_CLINIC_OR_DEPARTMENT_OTHER): Payer: Medicare Other

## 2019-03-08 ENCOUNTER — Other Ambulatory Visit: Payer: Self-pay

## 2019-03-08 ENCOUNTER — Encounter: Payer: Self-pay | Admitting: Family Medicine

## 2019-03-08 ENCOUNTER — Encounter (HOSPITAL_BASED_OUTPATIENT_CLINIC_OR_DEPARTMENT_OTHER): Payer: Self-pay | Admitting: Emergency Medicine

## 2019-03-08 ENCOUNTER — Telehealth (INDEPENDENT_AMBULATORY_CARE_PROVIDER_SITE_OTHER): Payer: Medicare Other | Admitting: Family Medicine

## 2019-03-08 DIAGNOSIS — K5730 Diverticulosis of large intestine without perforation or abscess without bleeding: Secondary | ICD-10-CM | POA: Diagnosis not present

## 2019-03-08 DIAGNOSIS — R109 Unspecified abdominal pain: Secondary | ICD-10-CM | POA: Insufficient documentation

## 2019-03-08 DIAGNOSIS — R1030 Lower abdominal pain, unspecified: Secondary | ICD-10-CM | POA: Diagnosis not present

## 2019-03-08 DIAGNOSIS — Z96653 Presence of artificial knee joint, bilateral: Secondary | ICD-10-CM | POA: Insufficient documentation

## 2019-03-08 DIAGNOSIS — K760 Fatty (change of) liver, not elsewhere classified: Secondary | ICD-10-CM | POA: Diagnosis not present

## 2019-03-08 DIAGNOSIS — K5792 Diverticulitis of intestine, part unspecified, without perforation or abscess without bleeding: Secondary | ICD-10-CM

## 2019-03-08 DIAGNOSIS — Z79899 Other long term (current) drug therapy: Secondary | ICD-10-CM | POA: Insufficient documentation

## 2019-03-08 DIAGNOSIS — R1032 Left lower quadrant pain: Secondary | ICD-10-CM | POA: Diagnosis present

## 2019-03-08 LAB — CBC WITH DIFFERENTIAL/PLATELET
Abs Immature Granulocytes: 0.03 10*3/uL (ref 0.00–0.07)
Basophils Absolute: 0 10*3/uL (ref 0.0–0.1)
Basophils Relative: 0 %
Eosinophils Absolute: 0.1 10*3/uL (ref 0.0–0.5)
Eosinophils Relative: 1 %
HCT: 46.5 % — ABNORMAL HIGH (ref 36.0–46.0)
Hemoglobin: 15.1 g/dL — ABNORMAL HIGH (ref 12.0–15.0)
Immature Granulocytes: 0 %
Lymphocytes Relative: 10 %
Lymphs Abs: 1.2 10*3/uL (ref 0.7–4.0)
MCH: 29.4 pg (ref 26.0–34.0)
MCHC: 32.5 g/dL (ref 30.0–36.0)
MCV: 90.5 fL (ref 80.0–100.0)
Monocytes Absolute: 0.9 10*3/uL (ref 0.1–1.0)
Monocytes Relative: 8 %
Neutro Abs: 9.4 10*3/uL — ABNORMAL HIGH (ref 1.7–7.7)
Neutrophils Relative %: 81 %
Platelets: 214 10*3/uL (ref 150–400)
RBC: 5.14 MIL/uL — ABNORMAL HIGH (ref 3.87–5.11)
RDW: 12.6 % (ref 11.5–15.5)
WBC: 11.6 10*3/uL — ABNORMAL HIGH (ref 4.0–10.5)
nRBC: 0 % (ref 0.0–0.2)

## 2019-03-08 LAB — COMPREHENSIVE METABOLIC PANEL
ALT: 15 U/L (ref 0–44)
AST: 20 U/L (ref 15–41)
Albumin: 3.7 g/dL (ref 3.5–5.0)
Alkaline Phosphatase: 86 U/L (ref 38–126)
Anion gap: 12 (ref 5–15)
BUN: 9 mg/dL (ref 8–23)
CO2: 22 mmol/L (ref 22–32)
Calcium: 9 mg/dL (ref 8.9–10.3)
Chloride: 103 mmol/L (ref 98–111)
Creatinine, Ser: 0.66 mg/dL (ref 0.44–1.00)
GFR calc Af Amer: >60 mL/min (ref >60)
GFR calc non Af Amer: >60 mL/min (ref >60)
Glucose, Bld: 112 mg/dL — ABNORMAL HIGH (ref 70–99)
Potassium: 3.9 mmol/L (ref 3.5–5.1)
Sodium: 137 mmol/L (ref 135–145)
Total Bilirubin: 0.8 mg/dL (ref 0.3–1.2)
Total Protein: 7.1 g/dL (ref 6.5–8.1)

## 2019-03-08 LAB — LIPASE, BLOOD: Lipase: 21 U/L (ref 11–51)

## 2019-03-08 MED ORDER — ONDANSETRON HCL 4 MG/2ML IJ SOLN
4.0000 mg | Freq: Once | INTRAMUSCULAR | Status: AC
  Administered 2019-03-08: 14:00:00 4 mg via INTRAVENOUS
  Filled 2019-03-08: qty 2

## 2019-03-08 MED ORDER — ONDANSETRON 4 MG PO TBDP: 4.0000 mg | ORAL_TABLET | Freq: Three times a day (TID) | ORAL | 0 refills | Status: DC | PRN

## 2019-03-08 MED ORDER — HYDROCODONE-ACETAMINOPHEN 5-325 MG PO TABS: 1.0000 | ORAL_TABLET | Freq: Four times a day (QID) | ORAL | 0 refills | Status: DC | PRN

## 2019-03-08 MED ORDER — AMOXICILLIN-POT CLAVULANATE 875-125 MG PO TABS: 1.0000 | ORAL_TABLET | Freq: Two times a day (BID) | ORAL | 0 refills | Status: AC

## 2019-03-08 MED ORDER — SODIUM CHLORIDE 0.9 % IV BOLUS
500.0000 mL | Freq: Once | INTRAVENOUS | Status: AC
  Administered 2019-03-08: 14:00:00 500 mL via INTRAVENOUS

## 2019-03-08 MED ORDER — SODIUM CHLORIDE 0.9 % IV SOLN
2.0000 g | Freq: Once | INTRAVENOUS | Status: AC
  Administered 2019-03-08: 17:00:00 2 g via INTRAVENOUS
  Filled 2019-03-08: qty 20

## 2019-03-08 MED ORDER — POLYETHYLENE GLYCOL 3350 17 G PO PACK: 17.0000 g | PACK | Freq: Every day | ORAL | 0 refills | Status: DC

## 2019-03-08 MED ORDER — METRONIDAZOLE IN NACL 5-0.79 MG/ML-% IV SOLN
500.0000 mg | Freq: Once | INTRAVENOUS | Status: AC
  Administered 2019-03-08: 500 mg via INTRAVENOUS
  Filled 2019-03-08: qty 100

## 2019-03-08 MED ORDER — ONDANSETRON 4 MG PO TBDP
4.0000 mg | ORAL_TABLET | Freq: Once | ORAL | Status: AC
  Administered 2019-03-08: 4 mg via ORAL
  Filled 2019-03-08: qty 1

## 2019-03-08 MED ORDER — HYDROCODONE-ACETAMINOPHEN 5-325 MG PO TABS
2.0000 | ORAL_TABLET | Freq: Once | ORAL | Status: AC
  Administered 2019-03-08: 2 via ORAL
  Filled 2019-03-08: qty 2

## 2019-03-08 MED ORDER — IOHEXOL 300 MG/ML  SOLN
100.0000 mL | Freq: Once | INTRAMUSCULAR | Status: AC | PRN
  Administered 2019-03-08: 14:00:00 100 mL via INTRAVENOUS

## 2019-03-08 MED ORDER — MORPHINE SULFATE (PF) 4 MG/ML IV SOLN
4.0000 mg | Freq: Once | INTRAVENOUS | Status: AC
  Administered 2019-03-08: 4 mg via INTRAVENOUS
  Filled 2019-03-08: qty 1

## 2019-03-08 NOTE — Discharge Instructions (Addendum)
You have been diagnosed today with Diverticulitis.  At this time there does not appear to be the presence of an emergent medical condition, however there is always the potential for conditions to change. Please read and follow the below instructions.  Please return to the Emergency Department immediately for any new or worsening symptoms or if your symptoms do not improve within 2 days. Please be sure to follow up with your Primary Care Provider within one week regarding your visit today; please call their office to schedule an appointment even if you are feeling better for a follow-up visit. Please take the antibiotic Augmentin as prescribed for treatment of your diverticulitis. You may use the nausea medication Zofran as prescribed to help with nausea and vomiting. You may use the MiraLAX as prescribed to help with constipation.  Additionally increasing your water intake will help with constipation. You may use the pain medication Norco as prescribed for severe pain.  Do not drive or operate heavy machinery with Norco as this will make you drowsy.  Do not drink alcohol or take other sedating medications with Norco as this will worsen side effects. You may follow-up with the gastroenterologist [place] for follow-up regarding your infection today.  If you have any new or worsening symptoms or if your symptoms do not improve in the next 2 days do not wait to follow-up with the gastroenterologist, instead return immediately to the emergency department. Additionally your CT scan today showed hepatic steatosis and a compression fracture at L3, discuss these incidental findings with your primary care provider at your next visit.  Get help right away if: Your pain gets worse. Your problems do not get better. Your problems get worse very fast. You have a fever. You throw up (vomit) more than one time. You have poop that is: Bloody. Black. Tarry. You have any new/worsening or concerning  symptoms.  Please read the additional information packets attached to your discharge summary.  Do not take your medicine if  develop an itchy rash, swelling in your mouth or lips, or difficulty breathing; call 911 and seek immediate emergency medical attention if this occurs.

## 2019-03-08 NOTE — ED Notes (Signed)
ED Provider at bedside. 

## 2019-03-08 NOTE — ED Provider Notes (Signed)
[facility] EMERGENCY DEPARTMENT Provider Note   CSN: [id number] Arrival date & time: 03/08/19  1151     History   Chief Complaint Chief Complaint  Patient presents with  . Abdominal Pain    HPI Leslie Knapp is a 68 y.o. female with history of morbid obesity, irritable bowel syndrome, fatty liver disease, diverticulosis presents today for abdominal pain x4 days.  Patient reports a periumbilical left lower quadrant abdominal pain that has been present for the past 4 days, waxing and waning between moderate and severe in intensity worsened with bowel movement and without alleviating factor.  She endorses nausea without vomiting over the past 2 days, she reports feeling warm but has not measured a temperature at home as she does not have a home thermometer.  No antipyretics prior to arrival.  Patient urged to present to the ER by PCP who is concerned for diverticulitis.  She denies cough, shortness of breath, chest pain, vomiting, dysuria/hematuria, vaginal concerns, extremity pain/swelling, fall/injury or any additional concerns today.     HPI  Past Medical History:  Diagnosis Date  . CTS (carpal tunnel syndrome)   . Fatty liver disease, nonalcoholic    In [facility] Study  . IBS (irritable bowel syndrome)    C/D  . Morbid obesity ([place])   . Osteoarthritis     Patient Active Problem List   Diagnosis Date Noted  . Abdominal pain 03/08/2019  . Pure hypercholesterolemia 09/25/2017  . Morbid obesity ([place]) 06/07/2017  . Encounter for Medicare annual wellness exam 06/07/2017  . Vitamin D deficiency 06/07/2017  . IBS (irritable bowel syndrome) 08/29/2016  . Migraine 08/29/2016  . Disorder of musculoskeletal system 03/23/2015  . Eye exam abnormal 03/23/2015  . NASH (nonalcoholic steatohepatitis) 10/19/2011    Past Surgical History:  Procedure Laterality Date  . KNEE SURGERY  1975   Left Patella Tendon Replacement  . TONSILLECTOMY  1963  . TOTAL KNEE ARTHROPLASTY   06.2005   Left  . TOTAL KNEE ARTHROPLASTY  11.2005   Right  . UTERINE POLYP REMOVAL  2010     OB History   No obstetric history on file.      Home Medications    Prior to Admission medications   Medication Sig Start Date End Date Taking? Authorizing Provider  Digestive Enzymes (ENZYME DIGEST) CAPS Take by mouth.   Yes [provider]  furosemide (LASIX) 20 MG tablet Take 1 tablet (20 mg total) by mouth daily. 02/21/19  Yes Brunetta Jeans, PA-C  Probiotic Product (PROBIOTIC-10 PO) Take 1 tablet by mouth daily.   Yes [provider]  Trace Min CaCrCuFeKMgMnPSeZn (MINERALS PO) Take 1 oz by mouth daily.   Yes [provider]  amoxicillin (AMOXIL) 500 MG tablet Take 4 tablets 1 hour before dental appointment. 04/08/15   [provider]  amoxicillin-clavulanate (AUGMENTIN) 875-125 MG tablet Take 1 tablet by mouth every 12 (twelve) hours for 10 days. 03/08/19 03/18/19  Deliah Boston, PA-C  aspirin-acetaminophen-caffeine (EXCEDRIN MIGRAINE) [phone] MG per tablet Take 2 tablets by mouth every 6 (six) hours as needed for headache.    [provider]  atorvastatin (LIPITOR) 10 MG tablet Take by mouth. 02/14/14   [provider]  Calcium Carbonate 1500 (600 CA) MG TABS Take 2 tablets by mouth daily.    [provider]  Cholecalciferol (VITAMIN D-3) 1000 UNITS CAPS Take 1 each by mouth daily.    [provider]  clotrimazole-betamethasone (LOTRISONE) cream Apply 1 application topically 2 (  two) times daily. For up to 2 weeks. 02/21/19   Brunetta Jeans, PA-C  diphenhydramine-acetaminophen (TYLENOL PM) 25-500 MG TABS Take 2 tablets by mouth at bedtime as needed (Pain & Sleep).    [provider]  Diphenhydramine-APAP 25-500 MG/15ML LIQD Take by mouth. 08/07/08   [provider]  HYDROcodone-acetaminophen (NORCO/VICODIN) 5-325 MG tablet Take 1-2 tablets by mouth every 6 (six) hours as needed. 03/08/19    Nuala Alpha A, PA-C  NON FORMULARY ROLFING: Deep Tissue Massage, 2 Txs weekly as needed for joint pain    [provider]  ondansetron (ZOFRAN ODT) 4 MG disintegrating tablet Take 1 tablet (4 mg total) by mouth every 8 (eight) hours as needed for nausea or vomiting. 03/08/19   Nuala Alpha A, PA-C  polyethylene glycol (MIRALAX / GLYCOLAX) 17 g packet Take 17 g by mouth daily. 03/08/19   Deliah Boston, PA-C    Family History Family History  Problem Relation Age of Onset  . Stroke Mother 56       Deceased  . Hypertension Mother   . GI problems Mother   . Arthritis Mother   . Dementia Mother   . Heart defect Father 12       Deceased  . Parkinson's disease Father   . Varicose Veins Father   . Stroke Maternal Grandfather   . Arthritis Maternal Grandfather   . Kidney failure Maternal Grandmother   . Gallbladder disease Maternal Grandmother   . Diverticulitis Sister        #1  . Arthritis Sister   . Healthy Sister        #2    Social History Social History   Tobacco Use  . Smoking status: Never Smoker  . Smokeless tobacco: Never Used  Substance Use Topics  . Alcohol use: No    Alcohol/week: 0.0 standard drinks  . Drug use: No     Allergies   Lactose intolerance (gi) and Celebrex [celecoxib]   Review of Systems Review of Systems Ten systems are reviewed and are negative for acute change except as noted in the HPI   Physical Exam Updated Vital Signs BP 118/72 (BP Location: Left Arm)   Pulse 81   Temp 98.7 F (37.1 C) (Oral)   Resp 18   Ht 5\' 10"  (1.778 m)   Wt (!) 152 kg   SpO2 96%   BMI 48.07 kg/m   Physical Exam Constitutional:      General: She is not in acute distress.    Appearance: Normal appearance. She is well-developed. She is not ill-appearing or diaphoretic.  HENT:     Head: Normocephalic and atraumatic.     Right Ear: External ear normal.     Left Ear: External ear normal.     Nose: Nose normal.  Eyes:     General:  Vision grossly intact. Gaze aligned appropriately.     Pupils: Pupils are equal, round, and reactive to light.  Neck:     Musculoskeletal: Normal range of motion.     Trachea: Trachea and phonation normal. No tracheal deviation.  Pulmonary:     Effort: Pulmonary effort is normal. No respiratory distress.  Abdominal:     General: There is no distension.     Palpations: Abdomen is soft.     Tenderness: There is abdominal tenderness in the periumbilical area and left lower quadrant. There is no guarding or rebound.  Musculoskeletal: Normal range of motion.  Skin:    General: Skin is  warm and dry.  Neurological:     Mental Status: She is alert.     GCS: GCS eye subscore is 4. GCS verbal subscore is 5. GCS motor subscore is 6.     Comments: Speech is clear and goal oriented, follows commands Major Cranial nerves without deficit, no facial droop Moves extremities without ataxia, coordination intact  Psychiatric:        Behavior: Behavior normal.      ED Treatments / Results  Labs (all labs ordered are listed, but only abnormal results are displayed) Labs Reviewed  CBC WITH DIFFERENTIAL/PLATELET - Abnormal; Notable for the following components:      Result Value   WBC 11.6 (*)    RBC 5.14 (*)    Hemoglobin 15.1 (*)    HCT 46.5 (*)    Neutro Abs 9.4 (*)    All other components within normal limits  COMPREHENSIVE METABOLIC PANEL - Abnormal; Notable for the following components:   Glucose, Bld 112 (*)    All other components within normal limits  LIPASE, BLOOD  URINALYSIS, ROUTINE W REFLEX MICROSCOPIC    EKG None  Radiology Ct Abdomen Pelvis W Contrast  Result Date: 03/08/2019 CLINICAL DATA:  Abdominal pain.  Suspected diverticulitis. EXAM: CT ABDOMEN AND PELVIS WITH CONTRAST TECHNIQUE: Multidetector CT imaging of the abdomen and pelvis was performed using the standard protocol following bolus administration of intravenous contrast. CONTRAST:  11mL OMNIPAQUE IOHEXOL 300  MG/ML  SOLN COMPARISON:  None. FINDINGS: Lower chest: No acute abnormality. Hepatobiliary: Hepatic steatosis is identified. The liver is otherwise normal. The gallbladder and portal vein are unremarkable. Pancreas: Unremarkable. No pancreatic ductal dilatation or surrounding inflammatory changes. Spleen: Normal in size without focal abnormality. Adrenals/Urinary Tract: Adrenal glands are unremarkable. Kidneys are normal, without renal calculi, focal lesion, or hydronephrosis. Bladder is unremarkable. Stomach/Bowel: The stomach and small bowel are normal. There is colonic wall thickening in the sigmoid colon with adjacent fat stranding. There are several diverticula in this region. There is fecal loading in the descending and proximal sigmoid colon. Remainder of the colon is unremarkable. The appendix is normal. Vascular/Lymphatic: No significant vascular findings are present. No enlarged abdominal or pelvic lymph nodes. Reproductive: Uterus and bilateral adnexa are unremarkable. Other: There is a small amount of free fluid in the pelvis. No free air. No extraluminal gas or fluid collection. Musculoskeletal: There is a compression fracture associated with L3 which is age indeterminate but may be nonacute. Degenerative changes are seen in the thoracic and lumbar spine. Severe degenerative changes are seen in the left hip with moderate degenerative changes on the right. IMPRESSION: 1. Wall thickening is associated with the sigmoid colon with adjacent fat stranding. Given the diverticuli in this region, diverticulitis is most likely. Recommend follow-up to complete resolution. 2. Hepatic steatosis. 3. Small amount of free fluid in the pelvis is likely secondary to the colonic abnormality. 4. The compression fracture at L3 is likely nonacute. Recommend clinical correlation. Electronically Signed   By: Dorise Bullion III M.D   On: 03/08/2019 14:55   Dg Abd 2 Views  Result Date: 03/07/2019 CLINICAL DATA:   Constipation. EXAM: ABDOMEN - 2 VIEW COMPARISON:  None. FINDINGS: A moderate amount of stool is present throughout the colon. No dilated loops of bowel are seen to suggest obstruction. No intraperitoneal free air is identified. Bilateral hip osteoarthrosis is severe on the left. IMPRESSION: Moderate colonic stool burden. Electronically Signed   By: Logan Bores M.D.   On: 03/07/2019 13:18  Procedures Procedures (including critical care time)  Medications Ordered in ED Medications  ondansetron (ZOFRAN-ODT) disintegrating tablet 4 mg (has no administration in time range)  morphine 4 MG/ML injection 4 mg (4 mg Intravenous Given 03/08/19 1350)  ondansetron (ZOFRAN) injection 4 mg (4 mg Intravenous Given 03/08/19 1348)  sodium chloride 0.9 % bolus 500 mL (0 mLs Intravenous Stopped 03/08/19 1627)  iohexol (OMNIPAQUE) 300 MG/ML solution 100 mL (100 mLs Intravenous Contrast Given 03/08/19 1427)  cefTRIAXone (ROCEPHIN) 2 g in sodium chloride 0.9 % 100 mL IVPB ( Intravenous Stopped 03/08/19 1720)    And  metroNIDAZOLE (FLAGYL) IVPB 500 mg (0 mg Intravenous Stopped 03/08/19 1649)  HYDROcodone-acetaminophen (NORCO/VICODIN) 5-325 MG per tablet 2 tablet (2 tablets Oral Given 03/08/19 1830)     Initial Impression / Assessment and Plan / ED Course  I have reviewed the triage vital signs and the nursing notes.  Pertinent labs & imaging results that were available during my care of the patient were reviewed by me and considered in my medical decision making (see chart for details).    CBC with leukocytosis of 11.6 with left shift Lipase within normal CMP nonacute CT abdomen pelvis:  IMPRESSION:  1. Wall thickening is associated with the sigmoid colon with  adjacent fat stranding. Given the diverticuli in this region,  diverticulitis is most likely. Recommend follow-up to complete  resolution.  2. Hepatic steatosis.  3. Small amount of free fluid in the pelvis is likely secondary to  the colonic  abnormality.  4. The compression fracture at L3 is likely nonacute. Recommend  clinical correlation.   Patient informed of CT findings today as well as incidental findings, she will follow-up with her primary care provider for her incidental findings.  Patient given Rocephin and Flagyl for treatment of diverticulitis.  Morphine and Zofran for pain and nausea.  Patient reassessed reports that she is feeling improved, she would like to be discharged for continued treatment at home.  Discussed risks versus benefits of outpatient treatment of diverticulitis, shared decision making made and patient elects to be discharged for treatment of her diverticulitis.  Plan to p.o. challenge and reassess prior to discharge. - Patient reassessed resting comfortably no acute distress she has eaten crackers and drank water here in the emergency department without difficulty, she is ambulating with her walker around the emergency department without difficulty.  She again would like to be discharged.  Will give patient additional Norco for pain prior to discharge.  Discussed precautions with patient and her sister regarding narcotics since they state understanding.  Patient sister who is a nurse will be taking care of her at home and driving from the emergency department.  Additionally patient with constipation for the past several days, will prescribe MiraLAX and encourage increased water intake. - Patient seen and evaluated by Dr. Maryan Rued during this visit, plan at this time is to discharge with Augmentin twice daily x10 days, Zofran and Norco.  PMP reviewed patient without active narcotic prescriptions. - Of note: Urinalysis not collected, patient denies any urinary symptoms and does not wish to stay for urinalysis, feels it is reasonable at this time.  Discussed with Dr. Maryan Rued.  At this time there does not appear to be any evidence of an acute emergency medical condition and the patient appears stable for discharge  with appropriate outpatient follow up. Diagnosis was discussed with patient who verbalizes understanding of care plan and is agreeable to discharge. I have discussed return precautions with patient and sister at  bedside he is who verbalizes understanding of return precautions. Patient encouraged to follow-up with their PCP and GI follow-up. All questions answered.  Patient has been discharged in good condition.   Note: Portions of this report may have been transcribed using voice recognition software. Every effort was made to ensure accuracy; however, inadvertent computerized transcription errors may still be present. Final Clinical Impressions(s) / ED Diagnoses   Final diagnoses:  Diverticulitis    ED Discharge Orders         Ordered    ondansetron (ZOFRAN ODT) 4 MG disintegrating tablet  Every 8 hours PRN     03/08/19 1849    amoxicillin-clavulanate (AUGMENTIN) 875-125 MG tablet  Every 12 hours     03/08/19 1849    HYDROcodone-acetaminophen (NORCO/VICODIN) 5-325 MG tablet  Every 6 hours PRN     03/08/19 1849    polyethylene glycol (MIRALAX / GLYCOLAX) 17 g packet  Daily     03/08/19 1849           Gari Crown 03/08/19 1904    Blanchie Dessert, MD 03/08/19 2330

## 2019-03-08 NOTE — Progress Notes (Signed)
Leslie Knapp - 68 y.o. female MRN [id number]  Date of birth: 01/08/1951   This visit type was conducted due to national recommendations for restrictions regarding the COVID-19 Pandemic (e.g. social distancing).  This format is felt to be most appropriate for this patient at this time.  All issues noted in this document were discussed and addressed.  No physical exam was performed (except for noted visual exam findings with Video Visits).  I discussed the limitations of evaluation and management by telemedicine and the availability of in person appointments. The patient expressed understanding and agreed to proceed.  I connected with@ on 03/08/19 at 10:40 AM EDT by a video enabled telemedicine application and verified that I am speaking with the correct person using two identifiers.   Patient Location: Home [address] [place] [address]   Provider location:   [facility]  Chief Complaint  Patient presents with   Constipation    pt was seen by Elyn Aquas yesterday/ took Xray no bowel obstruction/ Pt feels worse from 8/28 appointment hx of diverticuli on colonoscopy/ pt nauseous has really bad headache and feels "chilled"/ constipation since tuesday/ very uncomfortable    HPI  Leslie Knapp is a 68 y.o. female who presents via audio/video conferencing for a telehealth visit today.  She has complaint of abdominal pain.   Began having mild abdominal pain around 5 days ago with bloating and constipation.  She has tried several things including stool softener, dulcolax, and her sister gave her a fleets enema without any improvement.  She has sensation that she needs to have a BM but is unable too.  Her sister is a Marine scientist and did DRE to check for impaction and everything was normal.  She was seen yesterday via virtual visit with her PCP.  Xray was obtained and showed moderate stool burden throughout the colon.  Last night and into today she reports that her abdominal pain  has worsened across the lower portion and L side and she has nausea to the point where she can only drink a few sips of water.  She also reports chills, fatigue and headache.  She denies respiratory symptoms, chest  pain.  She is unsure if she has had fever as her thermometer is broken.    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   Left   TOTAL KNEE ARTHROPLASTY  11.2005   Right   UTERINE POLYP REMOVAL  2010    Family History  Problem Relation Age of Onset   Stroke Mother 69       Deceased   Hypertension Mother    GI problems Mother    Arthritis Mother    Dementia Mother    Heart defect Father 4       Deceased   Parkinson's disease Father    Varicose Veins Father    Stroke Maternal Grandfather    Arthritis Maternal Grandfather    Kidney failure Maternal Grandmother    Gallbladder disease Maternal Grandmother  Diverticulitis Sister        #1   Arthritis Sister    Healthy Sister        #2    Social History   Socioeconomic History   Marital status: Married    Spouse name: Not on file   Number of children: Not on file   Years of education: Not on file   Highest education level: Not on file  Occupational History   Not on file  Social Needs   Financial resource strain: Not on file   Food insecurity    Worry: Not on file    Inability: Not on file   Transportation needs    Medical: Not on file    Non-medical: Not on file  Tobacco Use   Smoking status: Never Smoker   Smokeless tobacco: Never Used  Substance and Sexual  Activity   Alcohol use: No    Alcohol/week: 0.0 standard drinks   Drug use: No   Sexual activity: Never  Lifestyle   Physical activity    Days per week: Not on file    Minutes per session: Not on file   Stress: Not on file  Relationships   Social connections    Talks on phone: Not on file    Gets together: Not on file    Attends religious service: Not on file    Active member of club or organization: Not on file    Attends meetings of clubs or organizations: Not on file    Relationship status: Not on file   Intimate partner violence    Fear of current or ex partner: Not on file    Emotionally abused: Not on file    Physically abused: Not on file    Forced sexual activity: Not on file  Other Topics Concern   Not on file  Social History Narrative   Not on file     Current Outpatient Medications:    aspirin-acetaminophen-caffeine ([place]ferro) 250-250-65 MG per tablet, Take 2 tablets by mouth every 6 (six) hours as needed for headache., Disp: , Rfl:    atorvastatin (LIPITOR) 10 MG tablet, Take by mouth., Disp: , Rfl:    Calcium Carbonate 1500 (600 CA) MG TABS, Take 2 tablets by mouth daily., Disp: , Rfl:    Cholecalciferol (VITAMIN D-3) 1000 UNITS CAPS, Take 1 each by mouth daily., Disp: , Rfl:    clotrimazole-betamethasone (LOTRISONE) cream, Apply 1 application topically 2 (two) times daily. For up to 2 weeks., Disp: 30 g, Rfl: 0   Digestive Enzymes (ENZYME DIGEST) CAPS, Take by mouth., Disp: , Rfl:    diphenhydramine-acetaminophen (TYLENOL PM) 25-500 MG TABS, Take 2 tablets by mouth at bedtime as needed (Pain & Sleep)., Disp: , Rfl:    Diphenhydramine-APAP 25-500 MG/15ML LIQD, Take by mouth., Disp: , Rfl:    furosemide (LASIX) 20 MG tablet, Take 1 tablet (20 mg total) by mouth daily., Disp: 30 tablet, Rfl: 3   NON FORMULARY, ROLFING: Deep Tissue Massage, 2 Txs weekly as needed for joint pain, Disp: , Rfl:    Probiotic Product (PROBIOTIC-10 PO), Take 1  tablet by mouth daily., Disp: , Rfl:    Trace Min CaCrCuFeKMgMnPSeZn (MINERALS PO), Take 1 oz by mouth daily., Disp: , Rfl:    amoxicillin (AMOXIL) 500 MG tablet, Take 4 tablets 1 hour before dental appointment., Disp: , Rfl:   EXAM:  VITALS per patient if applicable: There were no vitals taken for this visit.  GENERAL: alert, oriented, appears ill  but non toxic  HEENT: atraumatic, conjunttiva clear, no obvious abnormalities on inspection of external nose and ears  NECK: normal movements of the head and neck  LUNGS: on inspection no signs of respiratory distress, breathing rate appears normal, no obvious gross SOB, gasping or wheezing  CV: no obvious cyanosis  MS: moves all visible extremities without noticeable abnormality  PSYCH/NEURO: pleasant and cooperative, no obvious depression or anxiety, speech and thought processing grossly intact  ASSESSMENT AND PLAN:  Discussed the following assessment and plan:  Abdominal pain -Suspect diverticulitis based on history and current symptoms.  -Given acute worsening over the past 24 hours and poor po intake I have advised her to seek care at her closest ER as I think she needs IV fluids and imaging to be sure there is no abscess or perforation.   -She expresses understanding of recommendations and plans to go to [facility].     >25 minutes spent with patient with 50% of time spent counseling and/or coordination of care as outlined above.      I discussed the assessment and treatment plan with the patient. The patient was provided an opportunity to ask questions and all were answered. The patient agreed with the plan and demonstrated an understanding of the instructions.   The patient was advised to call back or seek an in-person evaluation if the symptoms worsen or if the condition fails to improve as anticipated.     Luetta Nutting, DO

## 2019-03-08 NOTE — ED Notes (Signed)
Pt d/c home with ride. Rx x 3 given for miralax, zofran, and augmentin. Pt verbalized understanding that pain medication has been electronically prescribed to pharmacy listed on paperwork

## 2019-03-08 NOTE — ED Triage Notes (Signed)
LLQ abd pain x 4 days with nausea.

## 2019-03-08 NOTE — Assessment & Plan Note (Signed)
-  Suspect diverticulitis based on history and current symptoms.  -Given acute worsening over the past 24 hours and poor po intake I have advised her to seek care at her closest ER as I think she needs IV fluids and imaging to be sure there is no abscess or perforation.   -She expresses understanding of recommendations and plans to go to [facility].

## 2019-03-08 NOTE — ED Notes (Signed)
ED Provider at bedside. Water given for PO challenge per MD order

## 2019-03-10 ENCOUNTER — Telehealth: Payer: Self-pay

## 2019-03-10 ENCOUNTER — Encounter: Payer: Self-pay | Admitting: Physician Assistant

## 2019-03-10 NOTE — Telephone Encounter (Signed)
Patient Name: Leslie Knapp Gender: Female DOB: 1951/04/30 Age: 68 Y 44 M 11 D Return Phone Number: [phone] (Primary), [phone] (Secondary) Address: City/State/Zip: [place] [address] Client [facility] Night - C Client Site [place] - Night Contact Type Call Who Is Calling Patient / Member / Family / Caregiver Call Type Triage / Clinical Caller Name Remo Lipps Relationship To Patient Sibling Return Phone Number [phone] (Primary) Chief Complaint Abdominal Pain Reason for Call Symptomatic / Request for [place] states, pt having issues with gi system. Provider Elyn Aquas. Pt is having constipation. No obstruction. Today has nausea, abd pain, and chills. Wanting her to get medicine. Pt is weak. Translation No Nurse Assessment Nurse: Laurann Montana, RN, [place]ow Date/Time Eilene Ghazi Time): 03/08/2019 10:02:36 AM Confirm and document reason for call. If symptomatic, describe symptoms. ---Caller states that patient had zoom meeting with MD yesterday because of constipation. Caller states that patient had xray yesterday per MD to R/O bowel obstruction. Caller states that she is a Marine scientist and attempted to manually remove stool from patient. Caller states there was no stool to remove. Caller states that she believes patient has diverticulitis. Caller states that patient has abd pain and nausea. Has the patient had close contact with a person known or suspected to have the novel coronavirus illness OR traveled / lives in area with major community spread (including international travel) in the last 14 days from the onset of symptoms? * If Asymptomatic, screen for exposure and travel within the last 14 days. ---No Does the patient have any new or worsening symptoms? ---Yes Will a triage be completed? ---Yes Related visit to physician within the last 2 weeks? ---No Does the PT have any  chronic conditions? (i.e. diabetes, asthma, this includes High risk factors for pregnancy, etc.) ---No Is this a behavioral health or substance abuse call? ---No PLEASE NOTE: All timestamps contained within this report are represented as [place] Standard Time. CONFIDENTIALTY NOTICE: This fax transmission is intended only for the addressee. It contains information that is legally privileged, confidential or otherwise protected from use or disclosure. If you are not the intended recipient, you are strictly prohibited from reviewing, disclosing, copying using or disseminating any of this information or taking any action in reliance on or regarding this information. If you have received this fax in error, please notify us immediately by telephone so that we can arrange for its return to [place]. Phone: [phone], Toll-Free: [phone], Fax: [phone] Page: 2 of 2 Call Id: [id number] Guidelines Guideline Title Affirmed Question Affirmed Notes Nurse Date/Time Eilene Ghazi Time) Abdominal Pain - Female [1] MODERATE pain (e.g., interferes with normal activities) AND [2] pain comes and goes (cramps) AND [3] present > 24 hours (Exception: pain with Vomiting or Diarrhea - see that Guideline) Laurann Montana, [place], Frans[place] Meadow 03/08/2019 10:04:56 AM Disp. Time Eilene Ghazi Time) Disposition Final User 03/08/2019 10:11:37 AM See HCP within 4 Hours (or PCP triage) Yes Laurann Montana, [place], Frans[place] Meadow Disposition Overriden: See PCP within 24 Hours Override Reason: Specify reason. (Please document in 'advice recommended' section) Caller Disagree/Comply Comply Caller Understands Yes PreDisposition Go to Urgent Care/Walk-In Clinic

## 2019-03-13 DIAGNOSIS — R0902 Hypoxemia: Secondary | ICD-10-CM | POA: Diagnosis not present

## 2019-03-13 DIAGNOSIS — R0602 Shortness of breath: Secondary | ICD-10-CM | POA: Diagnosis not present

## 2019-03-13 DIAGNOSIS — I491 Atrial premature depolarization: Secondary | ICD-10-CM | POA: Diagnosis not present

## 2019-03-13 DIAGNOSIS — R29810 Facial weakness: Secondary | ICD-10-CM | POA: Diagnosis not present

## 2019-03-13 DIAGNOSIS — R52 Pain, unspecified: Secondary | ICD-10-CM | POA: Diagnosis not present

## 2019-03-14 ENCOUNTER — Telehealth: Payer: Self-pay | Admitting: Physician Assistant

## 2019-03-14 ENCOUNTER — Ambulatory Visit: Payer: Medicare Other | Admitting: Physician Assistant

## 2019-03-14 DIAGNOSIS — Z8774 Personal history of (corrected) congenital malformations of heart and circulatory system: Secondary | ICD-10-CM | POA: Diagnosis not present

## 2019-03-14 DIAGNOSIS — I742 Embolism and thrombosis of arteries of the upper extremities: Secondary | ICD-10-CM | POA: Diagnosis not present

## 2019-03-14 DIAGNOSIS — Z886 Allergy status to analgesic agent status: Secondary | ICD-10-CM | POA: Diagnosis not present

## 2019-03-14 DIAGNOSIS — R209 Unspecified disturbances of skin sensation: Secondary | ICD-10-CM | POA: Diagnosis not present

## 2019-03-14 DIAGNOSIS — I700 Atherosclerosis of aorta: Secondary | ICD-10-CM | POA: Diagnosis not present

## 2019-03-14 DIAGNOSIS — E042 Nontoxic multinodular goiter: Secondary | ICD-10-CM | POA: Diagnosis not present

## 2019-03-14 DIAGNOSIS — I2602 Saddle embolus of pulmonary artery with acute cor pulmonale: Secondary | ICD-10-CM | POA: Diagnosis not present

## 2019-03-14 DIAGNOSIS — Z823 Family history of stroke: Secondary | ICD-10-CM | POA: Diagnosis not present

## 2019-03-14 DIAGNOSIS — K5732 Diverticulitis of large intestine without perforation or abscess without bleeding: Secondary | ICD-10-CM | POA: Diagnosis not present

## 2019-03-14 DIAGNOSIS — I253 Aneurysm of heart: Secondary | ICD-10-CM | POA: Diagnosis not present

## 2019-03-14 DIAGNOSIS — L7632 Postprocedural hematoma of skin and subcutaneous tissue following other procedure: Secondary | ICD-10-CM | POA: Diagnosis not present

## 2019-03-14 DIAGNOSIS — R000 Tachycardia, unspecified: Secondary | ICD-10-CM | POA: Diagnosis not present

## 2019-03-14 DIAGNOSIS — G43909 Migraine, unspecified, not intractable, without status migrainosus: Secondary | ICD-10-CM | POA: Diagnosis present

## 2019-03-14 DIAGNOSIS — R918 Other nonspecific abnormal finding of lung field: Secondary | ICD-10-CM | POA: Diagnosis present

## 2019-03-14 DIAGNOSIS — Z9889 Other specified postprocedural states: Secondary | ICD-10-CM | POA: Diagnosis not present

## 2019-03-14 DIAGNOSIS — Z20828 Contact with and (suspected) exposure to other viral communicable diseases: Secondary | ICD-10-CM | POA: Diagnosis present

## 2019-03-14 DIAGNOSIS — E041 Nontoxic single thyroid nodule: Secondary | ICD-10-CM | POA: Diagnosis not present

## 2019-03-14 DIAGNOSIS — R531 Weakness: Secondary | ICD-10-CM | POA: Diagnosis not present

## 2019-03-14 DIAGNOSIS — I4510 Unspecified right bundle-branch block: Secondary | ICD-10-CM | POA: Diagnosis not present

## 2019-03-14 DIAGNOSIS — I2692 Saddle embolus of pulmonary artery without acute cor pulmonale: Secondary | ICD-10-CM | POA: Diagnosis not present

## 2019-03-14 DIAGNOSIS — Z6841 Body Mass Index (BMI) 40.0 and over, adult: Secondary | ICD-10-CM | POA: Diagnosis not present

## 2019-03-14 DIAGNOSIS — Z79899 Other long term (current) drug therapy: Secondary | ICD-10-CM | POA: Diagnosis not present

## 2019-03-14 DIAGNOSIS — I82621 Acute embolism and thrombosis of deep veins of right upper extremity: Secondary | ICD-10-CM | POA: Diagnosis not present

## 2019-03-14 DIAGNOSIS — M7989 Other specified soft tissue disorders: Secondary | ICD-10-CM | POA: Diagnosis not present

## 2019-03-14 DIAGNOSIS — R338 Other retention of urine: Secondary | ICD-10-CM | POA: Diagnosis not present

## 2019-03-14 DIAGNOSIS — R7989 Other specified abnormal findings of blood chemistry: Secondary | ICD-10-CM | POA: Diagnosis not present

## 2019-03-14 DIAGNOSIS — R040 Epistaxis: Secondary | ICD-10-CM | POA: Diagnosis not present

## 2019-03-14 DIAGNOSIS — I998 Other disorder of circulatory system: Secondary | ICD-10-CM | POA: Diagnosis not present

## 2019-03-14 DIAGNOSIS — Z66 Do not resuscitate: Secondary | ICD-10-CM | POA: Diagnosis not present

## 2019-03-14 DIAGNOSIS — I70208 Unspecified atherosclerosis of native arteries of extremities, other extremity: Secondary | ICD-10-CM | POA: Diagnosis not present

## 2019-03-14 DIAGNOSIS — I248 Other forms of acute ischemic heart disease: Secondary | ICD-10-CM | POA: Diagnosis present

## 2019-03-14 DIAGNOSIS — R0602 Shortness of breath: Secondary | ICD-10-CM | POA: Diagnosis not present

## 2019-03-14 DIAGNOSIS — I999 Unspecified disorder of circulatory system: Secondary | ICD-10-CM | POA: Diagnosis not present

## 2019-03-14 DIAGNOSIS — R9431 Abnormal electrocardiogram [ECG] [EKG]: Secondary | ICD-10-CM | POA: Diagnosis not present

## 2019-03-14 DIAGNOSIS — R51 Headache: Secondary | ICD-10-CM | POA: Diagnosis not present

## 2019-03-14 DIAGNOSIS — Q211 Atrial septal defect: Secondary | ICD-10-CM | POA: Diagnosis not present

## 2019-03-14 DIAGNOSIS — R0989 Other specified symptoms and signs involving the circulatory and respiratory systems: Secondary | ICD-10-CM | POA: Diagnosis not present

## 2019-03-14 DIAGNOSIS — I2699 Other pulmonary embolism without acute cor pulmonale: Secondary | ICD-10-CM | POA: Diagnosis not present

## 2019-03-14 MED ORDER — GENERIC EXTERNAL MEDICATION
0.25 | Status: DC
Start: ? — End: 2019-03-14

## 2019-03-14 MED ORDER — MUPIROCIN 2 % EX OINT
TOPICAL_OINTMENT | CUTANEOUS | Status: DC
Start: 2019-03-18 — End: 2019-03-14

## 2019-03-14 MED ORDER — HEPARIN SOD (PORCINE) IN D5W 100 UNIT/ML IV SOLN
40.00 | INTRAVENOUS | Status: DC
Start: ? — End: 2019-03-14

## 2019-03-14 MED ORDER — HEPARIN SOD (PORCINE) IN D5W 100 UNIT/ML IV SOLN
5.00 | INTRAVENOUS | Status: DC
Start: ? — End: 2019-03-14

## 2019-03-14 MED ORDER — HEPARIN SOD (PORCINE) IN D5W 100 UNIT/ML IV SOLN
10000.00 | INTRAVENOUS | Status: DC
Start: ? — End: 2019-03-14

## 2019-03-14 MED ORDER — HEPARIN SOD (PORCINE) IN D5W 100 UNIT/ML IV SOLN
14.20 | INTRAVENOUS | Status: DC
Start: ? — End: 2019-03-14

## 2019-03-14 NOTE — Telephone Encounter (Signed)
Pt's sister called in stating that pt is in [facility] with blood clots in her arm and lung. Leslie Knapp wanted to talk to you about her sister. Please call [phone]

## 2019-03-14 NOTE — Telephone Encounter (Signed)
Spoke with patient's sister. Will keep an eye out on progress via [place].

## 2019-03-17 DIAGNOSIS — I700 Atherosclerosis of aorta: Secondary | ICD-10-CM | POA: Insufficient documentation

## 2019-03-18 ENCOUNTER — Ambulatory Visit: Payer: Medicare Other | Admitting: Physician Assistant

## 2019-03-18 MED ORDER — HEPARIN SOD (PORCINE) IN D5W 100 UNIT/ML IV SOLN
5.00 | INTRAVENOUS | Status: DC
Start: ? — End: 2019-03-18

## 2019-03-18 MED ORDER — GENERIC EXTERNAL MEDICATION
8.60 | Status: DC
Start: 2019-03-18 — End: 2019-03-18

## 2019-03-18 MED ORDER — MELATONIN 3 MG PO TABS
3.00 | ORAL_TABLET | ORAL | Status: DC
Start: ? — End: 2019-03-18

## 2019-03-18 MED ORDER — GENERIC EXTERNAL MEDICATION
600.00 | Status: DC
Start: ? — End: 2019-03-18

## 2019-03-18 MED ORDER — GLUCOSE 40 % PO GEL
15.00 | ORAL | Status: DC
Start: ? — End: 2019-03-18

## 2019-03-18 MED ORDER — DEXTROSE 10 % IV SOLN
125.00 | INTRAVENOUS | Status: DC
Start: ? — End: 2019-03-18

## 2019-03-21 DIAGNOSIS — Q2112 Patent foramen ovale: Secondary | ICD-10-CM | POA: Insufficient documentation

## 2019-03-21 DIAGNOSIS — I253 Aneurysm of heart: Secondary | ICD-10-CM | POA: Insufficient documentation

## 2019-03-21 HISTORY — PX: PATENT FORAMEN OVALE(PFO) CLOSURE: CATH118300

## 2019-03-21 HISTORY — PX: CARDIAC CATHETERIZATION: SHX172

## 2019-03-23 DIAGNOSIS — Z7982 Long term (current) use of aspirin: Secondary | ICD-10-CM | POA: Diagnosis not present

## 2019-03-23 DIAGNOSIS — I2602 Saddle embolus of pulmonary artery with acute cor pulmonale: Secondary | ICD-10-CM | POA: Diagnosis not present

## 2019-03-23 DIAGNOSIS — Z48812 Encounter for surgical aftercare following surgery on the circulatory system: Secondary | ICD-10-CM | POA: Diagnosis not present

## 2019-03-23 DIAGNOSIS — Z7901 Long term (current) use of anticoagulants: Secondary | ICD-10-CM | POA: Diagnosis not present

## 2019-03-23 DIAGNOSIS — R9431 Abnormal electrocardiogram [ECG] [EKG]: Secondary | ICD-10-CM | POA: Diagnosis not present

## 2019-03-23 DIAGNOSIS — I252 Old myocardial infarction: Secondary | ICD-10-CM | POA: Diagnosis not present

## 2019-03-23 DIAGNOSIS — E041 Nontoxic single thyroid nodule: Secondary | ICD-10-CM | POA: Diagnosis not present

## 2019-03-23 DIAGNOSIS — G43909 Migraine, unspecified, not intractable, without status migrainosus: Secondary | ICD-10-CM | POA: Diagnosis not present

## 2019-03-23 DIAGNOSIS — Z6841 Body Mass Index (BMI) 40.0 and over, adult: Secondary | ICD-10-CM | POA: Diagnosis not present

## 2019-03-23 DIAGNOSIS — Z5181 Encounter for therapeutic drug level monitoring: Secondary | ICD-10-CM | POA: Diagnosis not present

## 2019-03-23 DIAGNOSIS — I700 Atherosclerosis of aorta: Secondary | ICD-10-CM | POA: Diagnosis not present

## 2019-03-23 DIAGNOSIS — K5792 Diverticulitis of intestine, part unspecified, without perforation or abscess without bleeding: Secondary | ICD-10-CM | POA: Diagnosis not present

## 2019-03-24 ENCOUNTER — Telehealth: Payer: Self-pay | Admitting: *Deleted

## 2019-03-24 ENCOUNTER — Encounter: Payer: Self-pay | Admitting: Physician Assistant

## 2019-03-24 DIAGNOSIS — I2602 Saddle embolus of pulmonary artery with acute cor pulmonale: Secondary | ICD-10-CM | POA: Diagnosis not present

## 2019-03-24 DIAGNOSIS — Z48812 Encounter for surgical aftercare following surgery on the circulatory system: Secondary | ICD-10-CM | POA: Diagnosis not present

## 2019-03-24 DIAGNOSIS — G43909 Migraine, unspecified, not intractable, without status migrainosus: Secondary | ICD-10-CM | POA: Diagnosis not present

## 2019-03-24 DIAGNOSIS — K5792 Diverticulitis of intestine, part unspecified, without perforation or abscess without bleeding: Secondary | ICD-10-CM | POA: Diagnosis not present

## 2019-03-24 DIAGNOSIS — E041 Nontoxic single thyroid nodule: Secondary | ICD-10-CM | POA: Diagnosis not present

## 2019-03-24 DIAGNOSIS — I2692 Saddle embolus of pulmonary artery without acute cor pulmonale: Secondary | ICD-10-CM

## 2019-03-24 DIAGNOSIS — I700 Atherosclerosis of aorta: Secondary | ICD-10-CM | POA: Diagnosis not present

## 2019-03-24 NOTE — Telephone Encounter (Signed)
Called and left a detailed message to give verbal ok  

## 2019-03-24 NOTE — Telephone Encounter (Signed)
Pt's care was provided at [facility].  I reviewed her hospitalization but do not yet see a D/C summary.  I do not know why she had multiple clots and I don't know if she was referred to hematology or not.  Given the she is subtherapeutic but having bleeding and bruising issues I don't feel comfortable managing this.  If she was not referred to hematology we need to place a stat referral (dx saddle embolism)

## 2019-03-24 NOTE — Addendum Note (Signed)
Addended by: Desmond Dike L on: 03/24/2019 05:00 PM   Modules accepted: Orders

## 2019-03-24 NOTE — Telephone Encounter (Signed)
[place] for orders? This is Leslie Knapp's pt

## 2019-03-24 NOTE — Telephone Encounter (Signed)
Ok for orders? 

## 2019-03-24 NOTE — Telephone Encounter (Signed)
Spoke with pt she is currently on lovenox drip and coumadin. Started coumadin 2 days ago. Referral was placed.

## 2019-03-24 NOTE — Telephone Encounter (Signed)
[place] with [facility] calling asking for orders for PT 2 times weekly for two weeks, then 1 time weekly  For seven weeks.    [phone] okay to leave detailed message with orders if she does not answer.

## 2019-03-24 NOTE — Telephone Encounter (Signed)
Called pt and LMOVM to return call to advise if she had been referred to Hematology after hospital stay.

## 2019-03-24 NOTE — Telephone Encounter (Signed)
Leslie Knapp with [facility] called to report PT/INR Readings and requests a call back for dosing.  PT: 18.5 INR: 1.5  Notes that patient has bruising on arms and neck, as well as occasional nose bleeds.  States that she gets a cough with a deep breath and feels like a "tickle" in her throat.  Leslie Knapp asks for a call back for directions [phone]

## 2019-03-25 ENCOUNTER — Encounter: Payer: Self-pay | Admitting: Physician Assistant

## 2019-03-26 ENCOUNTER — Encounter: Payer: Self-pay | Admitting: Physician Assistant

## 2019-03-26 ENCOUNTER — Telehealth: Payer: Self-pay

## 2019-03-26 DIAGNOSIS — M7981 Nontraumatic hematoma of soft tissue: Secondary | ICD-10-CM | POA: Diagnosis present

## 2019-03-26 DIAGNOSIS — T81718A Complication of other artery following a procedure, not elsewhere classified, initial encounter: Secondary | ICD-10-CM | POA: Diagnosis present

## 2019-03-26 DIAGNOSIS — M79605 Pain in left leg: Secondary | ICD-10-CM | POA: Diagnosis not present

## 2019-03-26 DIAGNOSIS — I700 Atherosclerosis of aorta: Secondary | ICD-10-CM | POA: Diagnosis not present

## 2019-03-26 DIAGNOSIS — R609 Edema, unspecified: Secondary | ICD-10-CM | POA: Diagnosis not present

## 2019-03-26 DIAGNOSIS — L7632 Postprocedural hematoma of skin and subcutaneous tissue following other procedure: Secondary | ICD-10-CM | POA: Diagnosis present

## 2019-03-26 DIAGNOSIS — R279 Unspecified lack of coordination: Secondary | ICD-10-CM | POA: Diagnosis not present

## 2019-03-26 DIAGNOSIS — G43909 Migraine, unspecified, not intractable, without status migrainosus: Secondary | ICD-10-CM | POA: Diagnosis present

## 2019-03-26 DIAGNOSIS — Z743 Need for continuous supervision: Secondary | ICD-10-CM | POA: Diagnosis not present

## 2019-03-26 DIAGNOSIS — Z20828 Contact with and (suspected) exposure to other viral communicable diseases: Secondary | ICD-10-CM | POA: Diagnosis present

## 2019-03-26 DIAGNOSIS — Z7901 Long term (current) use of anticoagulants: Secondary | ICD-10-CM | POA: Diagnosis not present

## 2019-03-26 DIAGNOSIS — R339 Retention of urine, unspecified: Secondary | ICD-10-CM | POA: Diagnosis not present

## 2019-03-26 DIAGNOSIS — I10 Essential (primary) hypertension: Secondary | ICD-10-CM | POA: Diagnosis not present

## 2019-03-26 DIAGNOSIS — R338 Other retention of urine: Secondary | ICD-10-CM | POA: Diagnosis not present

## 2019-03-26 DIAGNOSIS — I959 Hypotension, unspecified: Secondary | ICD-10-CM | POA: Diagnosis not present

## 2019-03-26 DIAGNOSIS — K5900 Constipation, unspecified: Secondary | ICD-10-CM | POA: Diagnosis not present

## 2019-03-26 DIAGNOSIS — M7989 Other specified soft tissue disorders: Secondary | ICD-10-CM | POA: Diagnosis not present

## 2019-03-26 DIAGNOSIS — Z7982 Long term (current) use of aspirin: Secondary | ICD-10-CM | POA: Diagnosis not present

## 2019-03-26 DIAGNOSIS — Z66 Do not resuscitate: Secondary | ICD-10-CM | POA: Diagnosis not present

## 2019-03-26 DIAGNOSIS — R000 Tachycardia, unspecified: Secondary | ICD-10-CM | POA: Diagnosis not present

## 2019-03-26 DIAGNOSIS — Z86711 Personal history of pulmonary embolism: Secondary | ICD-10-CM | POA: Diagnosis not present

## 2019-03-26 DIAGNOSIS — E041 Nontoxic single thyroid nodule: Secondary | ICD-10-CM | POA: Diagnosis not present

## 2019-03-26 DIAGNOSIS — Z6841 Body Mass Index (BMI) 40.0 and over, adult: Secondary | ICD-10-CM | POA: Diagnosis not present

## 2019-03-26 DIAGNOSIS — Z8774 Personal history of (corrected) congenital malformations of heart and circulatory system: Secondary | ICD-10-CM | POA: Diagnosis not present

## 2019-03-26 DIAGNOSIS — K5792 Diverticulitis of intestine, part unspecified, without perforation or abscess without bleeding: Secondary | ICD-10-CM | POA: Diagnosis not present

## 2019-03-26 DIAGNOSIS — I724 Aneurysm of artery of lower extremity: Secondary | ICD-10-CM | POA: Diagnosis not present

## 2019-03-26 DIAGNOSIS — Z48812 Encounter for surgical aftercare following surgery on the circulatory system: Secondary | ICD-10-CM | POA: Diagnosis not present

## 2019-03-26 DIAGNOSIS — R52 Pain, unspecified: Secondary | ICD-10-CM | POA: Diagnosis not present

## 2019-03-26 DIAGNOSIS — I2692 Saddle embolus of pulmonary artery without acute cor pulmonale: Secondary | ICD-10-CM | POA: Diagnosis not present

## 2019-03-26 DIAGNOSIS — R404 Transient alteration of awareness: Secondary | ICD-10-CM | POA: Diagnosis not present

## 2019-03-26 DIAGNOSIS — I2602 Saddle embolus of pulmonary artery with acute cor pulmonale: Secondary | ICD-10-CM | POA: Diagnosis not present

## 2019-03-26 NOTE — Telephone Encounter (Signed)
Pt went to ER b/c area in groin was enlarging

## 2019-03-26 NOTE — Telephone Encounter (Signed)
Continue Lovenox until INR 2.5- 3.5 Next PT/INR check should be Friday AM It is due to her weakness that I want her to see Hematology

## 2019-03-26 NOTE — Telephone Encounter (Signed)
Since pain just started today, I agree w/ monitoring for now.  Cool pack to the area for no more than 20 minutes at a time.  If the nerves were irritated or inflamed, this should calm w/ time

## 2019-03-26 NOTE — Telephone Encounter (Signed)
Leslie Knapp is aware of orders per Dr. Birdie Riddle. Faxing orders for lab work on Friday for sign off.

## 2019-03-26 NOTE — Telephone Encounter (Signed)
Patty from [facility] calling back stating that patient and her sister reached out after she had left the home. Stated that patient is c/o sharp pain in her left groin.  "pea size knot" near incision from previous procedures. Patty stated that "it feels like a hematoma." Stated this is the first time patient has c/o pain in that area since procedure.   Spoke with patient, pain began when patient was readjusting in the recliner. Patient's sister placed a cold pack on area where the knot is. Stitches were removed on Saturday from the incision. Patty from [facility] outlined area where the knot is located to monitor any growth.   Patient stated that the sharp pain comes and goes, regardless of any movement. Concerned that it is possibly nerve damage. Informed patient that I would route message to Dr. Birdie Riddle.

## 2019-03-26 NOTE — Telephone Encounter (Signed)
Leslie Knapp with [facility] [phone]  PT 22.8 INR 1.9  Lovenox 150 mg injections twice daily, Leslie Knapp wanted to know if PCP is wanting to continue Lovenox injections, patient is due for one today.   Coumadin 7.5 mg daily  Wanted to know when PCP wanted next INR and PT rechecked.  Leslie Knapp is aware that Dr. Birdie Riddle recommended doing a follow up appt with Dr. Marin Olp, reaching out to his office to see if they can do virtual visit, patient is unable to leave the house, extremely weak.   Aware that I am routing message as high priority to Dr. Birdie Riddle and Elyn Aquas, PA.

## 2019-03-27 DIAGNOSIS — Z7901 Long term (current) use of anticoagulants: Secondary | ICD-10-CM | POA: Diagnosis not present

## 2019-03-27 DIAGNOSIS — I724 Aneurysm of artery of lower extremity: Secondary | ICD-10-CM | POA: Diagnosis not present

## 2019-03-27 DIAGNOSIS — Z86711 Personal history of pulmonary embolism: Secondary | ICD-10-CM | POA: Diagnosis not present

## 2019-03-27 DIAGNOSIS — Z8774 Personal history of (corrected) congenital malformations of heart and circulatory system: Secondary | ICD-10-CM | POA: Diagnosis not present

## 2019-04-02 ENCOUNTER — Telehealth: Payer: Self-pay | Admitting: *Deleted

## 2019-04-02 DIAGNOSIS — I2602 Saddle embolus of pulmonary artery with acute cor pulmonale: Secondary | ICD-10-CM | POA: Diagnosis not present

## 2019-04-02 DIAGNOSIS — Z48812 Encounter for surgical aftercare following surgery on the circulatory system: Secondary | ICD-10-CM | POA: Diagnosis not present

## 2019-04-02 DIAGNOSIS — G43909 Migraine, unspecified, not intractable, without status migrainosus: Secondary | ICD-10-CM | POA: Diagnosis not present

## 2019-04-02 DIAGNOSIS — I700 Atherosclerosis of aorta: Secondary | ICD-10-CM | POA: Diagnosis not present

## 2019-04-02 DIAGNOSIS — K5792 Diverticulitis of intestine, part unspecified, without perforation or abscess without bleeding: Secondary | ICD-10-CM | POA: Diagnosis not present

## 2019-04-02 DIAGNOSIS — E041 Nontoxic single thyroid nodule: Secondary | ICD-10-CM | POA: Diagnosis not present

## 2019-04-02 NOTE — Telephone Encounter (Addendum)
Patt[place] with [facility] called in requesting orders to resume home care.   3 times weekly for 1 week 2 times weekly for 1 week 1 time weekly for 1 week EOW thereafter depending on INR Status.  Also requested an order for an extra large wheelchair be faxed to [phone]  CB# [phone]

## 2019-04-02 NOTE — Telephone Encounter (Signed)
[place] for orders and rx for wheelchair

## 2019-04-02 NOTE — Telephone Encounter (Signed)
LM for nurse with instructions. Asked that she call me back to confirm the message was received.

## 2019-04-02 NOTE — Telephone Encounter (Signed)
[place] for both. I will handwrite prescription for wheelchair.

## 2019-04-02 NOTE — Telephone Encounter (Signed)
Nurse returned called and stated understanding.  Advised that she will call back on Friday with the next PT/INR results.

## 2019-04-02 NOTE — Telephone Encounter (Signed)
Results  PT 14.9 INR 1.2  Current Medications:  81mg  aspirin daily 140mg  lovenox twice daily 7.5mg  daily  Please advise if okay to do all 3 today or if there are any changes.

## 2019-04-02 NOTE — Telephone Encounter (Signed)
Need to continue until INR is at least 2.5. Continue all three and recheck PT/INR Friday

## 2019-04-03 ENCOUNTER — Telehealth: Payer: Self-pay | Admitting: General Practice

## 2019-04-03 DIAGNOSIS — G43909 Migraine, unspecified, not intractable, without status migrainosus: Secondary | ICD-10-CM | POA: Diagnosis not present

## 2019-04-03 DIAGNOSIS — I2602 Saddle embolus of pulmonary artery with acute cor pulmonale: Secondary | ICD-10-CM | POA: Diagnosis not present

## 2019-04-03 DIAGNOSIS — I700 Atherosclerosis of aorta: Secondary | ICD-10-CM | POA: Diagnosis not present

## 2019-04-03 DIAGNOSIS — K5792 Diverticulitis of intestine, part unspecified, without perforation or abscess without bleeding: Secondary | ICD-10-CM | POA: Diagnosis not present

## 2019-04-03 DIAGNOSIS — Z48812 Encounter for surgical aftercare following surgery on the circulatory system: Secondary | ICD-10-CM | POA: Diagnosis not present

## 2019-04-03 DIAGNOSIS — E041 Nontoxic single thyroid nodule: Secondary | ICD-10-CM | POA: Diagnosis not present

## 2019-04-03 NOTE — Telephone Encounter (Signed)
Patty with [facility] called in requesting verbal orders for UA and culture due to pt having painful urination. Spoke with PCP and verbal ok given.

## 2019-04-04 ENCOUNTER — Telehealth: Payer: Self-pay | Admitting: *Deleted

## 2019-04-04 DIAGNOSIS — G43909 Migraine, unspecified, not intractable, without status migrainosus: Secondary | ICD-10-CM | POA: Diagnosis not present

## 2019-04-04 DIAGNOSIS — I700 Atherosclerosis of aorta: Secondary | ICD-10-CM | POA: Diagnosis not present

## 2019-04-04 DIAGNOSIS — Z48812 Encounter for surgical aftercare following surgery on the circulatory system: Secondary | ICD-10-CM | POA: Diagnosis not present

## 2019-04-04 DIAGNOSIS — I2602 Saddle embolus of pulmonary artery with acute cor pulmonale: Secondary | ICD-10-CM | POA: Diagnosis not present

## 2019-04-04 DIAGNOSIS — K5792 Diverticulitis of intestine, part unspecified, without perforation or abscess without bleeding: Secondary | ICD-10-CM | POA: Diagnosis not present

## 2019-04-04 DIAGNOSIS — E041 Nontoxic single thyroid nodule: Secondary | ICD-10-CM | POA: Diagnosis not present

## 2019-04-04 DIAGNOSIS — N390 Urinary tract infection, site not specified: Secondary | ICD-10-CM | POA: Diagnosis not present

## 2019-04-04 NOTE — Telephone Encounter (Signed)
Nurse called back and patient was very anxious about waiting until Thursday for a new reading. Wanted the nurse to call and see if it could be checked on Monday and Thursday when the nurse is there. Advised that I would verify with provider and call her back.

## 2019-04-04 NOTE — Telephone Encounter (Signed)
I was pushing it out a ways because we did a close check this time and there had not been much change. I am ok with her checking Monday and Thursday if patient feels more comfortable

## 2019-04-04 NOTE — Telephone Encounter (Signed)
Continue current regimen. Repeat INR 1 week

## 2019-04-04 NOTE — Telephone Encounter (Signed)
I have given nurse the information.

## 2019-04-04 NOTE — Telephone Encounter (Signed)
Gave information to nurse.   She is scheduled to be in the home again on Thursday, they will recheck at that time.

## 2019-04-04 NOTE — Telephone Encounter (Signed)
Home care Nurse calling with with PT/INR Reading.  INR 1.5 PT 17.8  Nurse patty asked for a CB [phone]

## 2019-04-07 ENCOUNTER — Telehealth: Payer: Self-pay

## 2019-04-07 ENCOUNTER — Encounter: Payer: Self-pay | Admitting: Physician Assistant

## 2019-04-07 ENCOUNTER — Ambulatory Visit (INDEPENDENT_AMBULATORY_CARE_PROVIDER_SITE_OTHER): Payer: Medicare Other | Admitting: Physician Assistant

## 2019-04-07 ENCOUNTER — Other Ambulatory Visit: Payer: Self-pay

## 2019-04-07 VITALS — BP 116/62 | HR 78 | Wt 314.0 lb

## 2019-04-07 DIAGNOSIS — Q211 Atrial septal defect: Secondary | ICD-10-CM

## 2019-04-07 DIAGNOSIS — N3000 Acute cystitis without hematuria: Secondary | ICD-10-CM

## 2019-04-07 DIAGNOSIS — I700 Atherosclerosis of aorta: Secondary | ICD-10-CM | POA: Diagnosis not present

## 2019-04-07 DIAGNOSIS — G43909 Migraine, unspecified, not intractable, without status migrainosus: Secondary | ICD-10-CM | POA: Diagnosis not present

## 2019-04-07 DIAGNOSIS — I2602 Saddle embolus of pulmonary artery with acute cor pulmonale: Secondary | ICD-10-CM | POA: Diagnosis not present

## 2019-04-07 DIAGNOSIS — I724 Aneurysm of artery of lower extremity: Secondary | ICD-10-CM | POA: Diagnosis not present

## 2019-04-07 DIAGNOSIS — Q2112 Patent foramen ovale: Secondary | ICD-10-CM

## 2019-04-07 DIAGNOSIS — K5792 Diverticulitis of intestine, part unspecified, without perforation or abscess without bleeding: Secondary | ICD-10-CM | POA: Diagnosis not present

## 2019-04-07 DIAGNOSIS — T81718A Complication of other artery following a procedure, not elsewhere classified, initial encounter: Secondary | ICD-10-CM

## 2019-04-07 DIAGNOSIS — Z48812 Encounter for surgical aftercare following surgery on the circulatory system: Secondary | ICD-10-CM | POA: Diagnosis not present

## 2019-04-07 DIAGNOSIS — I742 Embolism and thrombosis of arteries of the upper extremities: Secondary | ICD-10-CM | POA: Diagnosis not present

## 2019-04-07 DIAGNOSIS — E041 Nontoxic single thyroid nodule: Secondary | ICD-10-CM | POA: Diagnosis not present

## 2019-04-07 MED ORDER — ENOXAPARIN SODIUM 150 MG/ML ~~LOC~~ SOLN: SUBCUTANEOUS | 0 refills | Status: DC

## 2019-04-07 MED ORDER — CEPHALEXIN 500 MG PO CAPS: 500.0000 mg | ORAL_CAPSULE | Freq: Two times a day (BID) | ORAL | 0 refills | Status: AC

## 2019-04-07 NOTE — Telephone Encounter (Signed)
[place] with [facility] [phone] requesting verbal orders for:  Home health occupational therapy 1 time a week x 6 weeks & Social worker evaluation

## 2019-04-07 NOTE — Progress Notes (Signed)
I have discussed the procedure for the virtual visit with the patient who has given consent to proceed with assessment and treatment.   Leslie Knapp S Shaune Westfall, CMA     

## 2019-04-07 NOTE — Progress Notes (Signed)
Virtual Visit via Video   I connected with patient on 04/07/19 at 11:00 AM EDT by a video enabled telemedicine application and verified that I am speaking with the correct person using two identifiers.  Location patient: Home Location provider: [facility], Office Persons participating in the virtual visit: Patient, Provider, [place] (Patina Moore)  I discussed the limitations of evaluation and management by telemedicine and the availability of in person appointments. The patient expressed understanding and agreed to proceed.  Subjective:   HPI:    Patient presents via [facility] today for TCM visit/Hospital follow-up. Patient presented to ER initially on 03/14/2019 w R arm pain and weakness with new onset chest pain and SOB. ER workup revealed saddle emboli via CTA and R brachial artery clot confirmed with [place]. Was started on a heparin drip and admitted for further assessment and acute management.  Vascular Surgery and IR consulted and EKOS thrombolysis performed with repeat pulmonary arteriogram and then IVF placement on 9/4. Patient later underwent thromboembolectomy of brachial, radial, and ulnar arteries with vascular surgery. Patient weaned off of O2 by 9/6. TEE performed on 9/9 with PFO closure on 9/11. She was discharged home on 03/22/2019 on warfarin with a Lovenox bridge for her PE. Also placed on ASA 81 mg daily.   Unfortunately after discharge, she developed pain and swelling in her L leg and returned to ER on 9/16. Workup there revealed a left femoral pseudoaneurysm. Patient admitted same day for further management. Patient underwent thrombin injection x 2 with clinical stability of pseudoaneurysm. Patient resumed on therapeutic anticoagulation with no new issue. Patient remained stable and discharged back home on 04/01/19 resuming her coumadin and lovenox bridge with [facility] RN, close Vascular and PCP follow-up.  Since discharge, notes she is doing some better. Notes if she is still there  is no pain but with any movement of the leg there is sharp pain in area of pseudoaneurysm. Per patient and sister the swelling in this area is improved. Bruising has gone from violaceous to green-tinted. No residual pain or swelling in the arm. Denies chest pain, racing heart. Notes some windedness with going to the bathroom but they both feel this is mainly from deconditioning. Has noted urinary urgency and dysuria. Denies fever, chills, abdominal pain or flank pain. Urine sample obtained earlier this week by [facility] RN. Awaiting culture results. HH RN has been keeping up with PT/INR. Last INR at 1.9. Trying to reach 2.5 before stopping lovenox bridging. Patient notes having a follow-up tomorrow with vascular surgery.  ROS:   See pertinent positives and negatives per HPI.  Patient Active Problem List   Diagnosis Date Noted  . Abdominal pain 03/08/2019  . Pure hypercholesterolemia 09/25/2017  . Morbid obesity ([place]) 06/07/2017  . Encounter for Medicare annual wellness exam 06/07/2017  . Vitamin D deficiency 06/07/2017  . IBS (irritable bowel syndrome) 08/29/2016  . Migraine 08/29/2016  . Disorder of musculoskeletal system 03/23/2015  . Eye exam abnormal 03/23/2015  . NASH (nonalcoholic steatohepatitis) 10/19/2011    Social History   Tobacco Use  . Smoking status: Never Smoker  . Smokeless tobacco: Never Used  Substance Use Topics  . Alcohol use: No    Alcohol/week: 0.0 standard drinks    Current Outpatient Medications:  .  aspirin EC 81 MG tablet, Take 1 tablet by mouth daily., Disp: , Rfl:  .  enoxaparin (LOVENOX) 150 MG/ML injection, Inject into the skin. Inject 0.93 mls into the skin every 12 hrs, Disp: , Rfl:  .  warfarin (COUMADIN) 5 MG tablet, Take by mouth. Take 1.5 tablet by mouth daily at 6 pm., Disp: , Rfl:  .  amoxicillin (AMOXIL) 500 MG tablet, Take 4 tablets 1 hour before dental appointment., Disp: , Rfl:  .  aspirin-acetaminophen-caffeine (EXCEDRIN MIGRAINE) 250-250-65 MG per  tablet, Take 2 tablets by mouth every 6 (six) hours as needed for headache., Disp: , Rfl:  .  atorvastatin (LIPITOR) 10 MG tablet, Take by mouth., Disp: , Rfl:  .  Calcium Carbonate 1500 (600 CA) MG TABS, Take 2 tablets by mouth daily., Disp: , Rfl:  .  Cholecalciferol (VITAMIN D-3) 1000 UNITS CAPS, Take 1 each by mouth daily., Disp: , Rfl:  .  clotrimazole-betamethasone (LOTRISONE) cream, Apply 1 application topically 2 (two) times daily. For up to 2 weeks., Disp: 30 g, Rfl: 0 .  Digestive Enzymes (ENZYME DIGEST) CAPS, Take by mouth., Disp: , Rfl:  .  diphenhydramine-acetaminophen (TYLENOL PM) 25-500 MG TABS, Take 2 tablets by mouth at bedtime as needed (Pain & Sleep)., Disp: , Rfl:  .  Diphenhydramine-APAP 25-500 MG/15ML LIQD, Take by mouth., Disp: , Rfl:  .  furosemide (LASIX) 20 MG tablet, Take 1 tablet (20 mg total) by mouth daily., Disp: 30 tablet, Rfl: 3 .  HYDROcodone-acetaminophen (NORCO/VICODIN) 5-325 MG tablet, Take 1-2 tablets by mouth every 6 (six) hours as needed., Disp: 8 tablet, Rfl: 0 .  NON FORMULARY, ROLFING: Deep Tissue Massage, 2 Txs weekly as needed for joint pain, Disp: , Rfl:  .  ondansetron (ZOFRAN ODT) 4 MG disintegrating tablet, Take 1 tablet (4 mg total) by mouth every 8 (eight) hours as needed for nausea or vomiting., Disp: 14 tablet, Rfl: 0 .  polyethylene glycol (MIRALAX / GLYCOLAX) 17 g packet, Take 17 g by mouth daily., Disp: 14 each, Rfl: 0 .  Probiotic Product (PROBIOTIC-10 PO), Take 1 tablet by mouth daily., Disp: , Rfl:  .  Trace Min CaCrCuFeKMgMnPSeZn (MINERALS PO), Take 1 oz by mouth daily., Disp: , Rfl:   Allergies  Allergen Reactions  . Lactose Intolerance (Gi)   . Celebrex [Celecoxib] Swelling and Rash    Objective:   BP 116/62   Pulse 78   Wt (!) 314 lb (142.4 kg)   SpO2 97%   BMI 45.05 kg/m   Patient is well-developed, well-nourished in no acute distress.  Resting comfortably at home. Is laying down on couch for entirety of visit but HOB  elevated Head is normocephalic, atraumatic.  No labored breathing.  Speech is clear and coherent with logical content.  Patient is alert and oriented at baseline.   Assessment and Plan:   1. Acute saddle pulmonary embolism with acute cor pulmonale (HCC) 2. Brachial artery thrombus (HCC) 3. PFO (patent foramen ovale) S/P surgical closure of patent foramen ovale, thrombectomy. Oxygen is stable. Taking medications as prescribed. INR at 1.9. Continue current coumadin dose and Lovenox bridge. Repeat PT/INR via [facility] RN this Thursday. Will stop bridging onnce INR 2.2-2.5. Follow-up with vascular surgery as scheduled.  4. 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 Working towards therapeutic PT/INR levels as noted above. Follow-up with vascular surgery tomorrow as scheduled.   5. Acute cystitis without hematuria Received urine culture results showing UTI with e.coli. Pan-sensitive per culture results. Will start Keflex 500 mg BID x 7 days. Close INR monitoring. Supportive measures reviewed.  - cephALEXin (KEFLEX) 500 MG capsule; Take 1 capsule (500 mg total) by mouth 2 (two) times daily for 7 days.  Dispense: 14 capsule; Refill: 0  Leeanne Rio, PA-C 04/07/2019

## 2019-04-07 NOTE — Telephone Encounter (Signed)
Ok to give verbal. Have them fax written orders for signature

## 2019-04-07 NOTE — Telephone Encounter (Signed)
LMOVM informing patient of message from PCP

## 2019-04-07 NOTE — Telephone Encounter (Signed)
Patti from [facility], [phone]  PT 22.4 INR 1.9  Wanting to know when to redraw and Lovenox dosage. Please advise

## 2019-04-07 NOTE — Telephone Encounter (Signed)
[place] of PCP OK verbal and left our fax number for written orders

## 2019-04-07 NOTE — Telephone Encounter (Signed)
Continue same dose of medication -- again until INR reaches at least 2.5. She has appt today so we will discuss further.She should be scheduled for repeat INR Thursday or Friday depending on when nurse is going back out.

## 2019-04-08 ENCOUNTER — Encounter: Payer: Self-pay | Admitting: Physician Assistant

## 2019-04-08 DIAGNOSIS — Z8679 Personal history of other diseases of the circulatory system: Secondary | ICD-10-CM | POA: Diagnosis not present

## 2019-04-08 DIAGNOSIS — Z95828 Presence of other vascular implants and grafts: Secondary | ICD-10-CM | POA: Diagnosis not present

## 2019-04-08 DIAGNOSIS — Q211 Atrial septal defect: Secondary | ICD-10-CM | POA: Diagnosis not present

## 2019-04-08 DIAGNOSIS — Z86711 Personal history of pulmonary embolism: Secondary | ICD-10-CM | POA: Diagnosis not present

## 2019-04-09 ENCOUNTER — Encounter: Payer: Self-pay | Admitting: Physician Assistant

## 2019-04-09 ENCOUNTER — Telehealth: Payer: Self-pay

## 2019-04-09 NOTE — Telephone Encounter (Signed)
  Chris with [facility] wanted PCP to know that patient has denied PT at this time, stated she is just not able to participate like she needs to.  [phone]

## 2019-04-10 ENCOUNTER — Telehealth: Payer: Self-pay | Admitting: Physician Assistant

## 2019-04-10 DIAGNOSIS — I2602 Saddle embolus of pulmonary artery with acute cor pulmonale: Secondary | ICD-10-CM | POA: Diagnosis not present

## 2019-04-10 DIAGNOSIS — K5792 Diverticulitis of intestine, part unspecified, without perforation or abscess without bleeding: Secondary | ICD-10-CM | POA: Diagnosis not present

## 2019-04-10 DIAGNOSIS — I700 Atherosclerosis of aorta: Secondary | ICD-10-CM | POA: Diagnosis not present

## 2019-04-10 DIAGNOSIS — G43909 Migraine, unspecified, not intractable, without status migrainosus: Secondary | ICD-10-CM | POA: Diagnosis not present

## 2019-04-10 DIAGNOSIS — Z48812 Encounter for surgical aftercare following surgery on the circulatory system: Secondary | ICD-10-CM | POA: Diagnosis not present

## 2019-04-10 DIAGNOSIS — E041 Nontoxic single thyroid nodule: Secondary | ICD-10-CM | POA: Diagnosis not present

## 2019-04-10 MED ORDER — ENOXAPARIN SODIUM 150 MG/ML ~~LOC~~ SOLN: SUBCUTANEOUS | 0 refills | Status: DC

## 2019-04-10 NOTE — Telephone Encounter (Signed)
Additional fill sent in for her.

## 2019-04-10 NOTE — Telephone Encounter (Signed)
PT/INR from today PT 24.6 INR 2.1  Katie advised Patty to continue Lovenox at same dose until INR at 2.5.

## 2019-04-10 NOTE — Telephone Encounter (Signed)
[facility] called due to patient contacting them and advising only 7 pens were prescribed for patient. Patient states her medication will not last until Monday. She has an appointment with Hematology on Tues 04/15/19. Unsure of next INR check.  Please advise if she will need more quantity of the Lovenox

## 2019-04-11 ENCOUNTER — Encounter: Payer: Self-pay | Admitting: Physician Assistant

## 2019-04-14 ENCOUNTER — Telehealth: Payer: Self-pay | Admitting: Emergency Medicine

## 2019-04-14 ENCOUNTER — Encounter: Payer: Self-pay | Admitting: Physician Assistant

## 2019-04-14 DIAGNOSIS — Z48812 Encounter for surgical aftercare following surgery on the circulatory system: Secondary | ICD-10-CM | POA: Diagnosis not present

## 2019-04-14 DIAGNOSIS — G43909 Migraine, unspecified, not intractable, without status migrainosus: Secondary | ICD-10-CM | POA: Diagnosis not present

## 2019-04-14 DIAGNOSIS — I2602 Saddle embolus of pulmonary artery with acute cor pulmonale: Secondary | ICD-10-CM

## 2019-04-14 DIAGNOSIS — I700 Atherosclerosis of aorta: Secondary | ICD-10-CM | POA: Diagnosis not present

## 2019-04-14 DIAGNOSIS — I724 Aneurysm of artery of lower extremity: Secondary | ICD-10-CM | POA: Diagnosis not present

## 2019-04-14 DIAGNOSIS — K5792 Diverticulitis of intestine, part unspecified, without perforation or abscess without bleeding: Secondary | ICD-10-CM | POA: Diagnosis not present

## 2019-04-14 DIAGNOSIS — E041 Nontoxic single thyroid nodule: Secondary | ICD-10-CM | POA: Diagnosis not present

## 2019-04-14 MED ORDER — WARFARIN SODIUM 5 MG PO TABS: ORAL_TABLET | ORAL | 6 refills | Status: DC

## 2019-04-14 MED ORDER — ASPIRIN EC 81 MG PO TBEC: 81.0000 mg | DELAYED_RELEASE_TABLET | Freq: Every day | ORAL | 1 refills | Status: DC

## 2019-04-14 NOTE — Telephone Encounter (Signed)
[facility] Nurse PT :27.0 INR: 2.2  Reports large hematoma of left groin The color has changed to dark brown with green border. The area feels larger and area appears fuller. Patient has sent a photo of the area with changes.   She has added greens in her diet consisently daily.   Needs refill of Aspirin and Coumadin.

## 2019-04-14 NOTE — Addendum Note (Signed)
Addended by: Leonidas Romberg on: 04/14/2019 01:51 PM   Modules accepted: Orders

## 2019-04-14 NOTE — Telephone Encounter (Signed)
Home Health Nurse advised they needed to contact vascular surgery about worsening hematoma. Advised to continue current medication regimen.  Gave verbal orders for recheck of PT/INR for Thursday. Refilled Aspirin and Coumadin to the pharmacy

## 2019-04-14 NOTE — Telephone Encounter (Signed)
Needs to contact vascular surgery -- [facility] RN can hopeful assist with this. Continue current regimen. Now that antibiotic is stopped this should hopefully help [place] reach goal quicker so we can stop her Lovenox. Ok to refill medications

## 2019-04-15 ENCOUNTER — Other Ambulatory Visit: Payer: Medicare Other

## 2019-04-15 ENCOUNTER — Ambulatory Visit: Payer: Medicare Other | Admitting: Family

## 2019-04-16 DIAGNOSIS — E041 Nontoxic single thyroid nodule: Secondary | ICD-10-CM | POA: Diagnosis not present

## 2019-04-16 DIAGNOSIS — G43909 Migraine, unspecified, not intractable, without status migrainosus: Secondary | ICD-10-CM | POA: Diagnosis not present

## 2019-04-16 DIAGNOSIS — I2602 Saddle embolus of pulmonary artery with acute cor pulmonale: Secondary | ICD-10-CM | POA: Diagnosis not present

## 2019-04-16 DIAGNOSIS — Z48812 Encounter for surgical aftercare following surgery on the circulatory system: Secondary | ICD-10-CM | POA: Diagnosis not present

## 2019-04-16 DIAGNOSIS — K5792 Diverticulitis of intestine, part unspecified, without perforation or abscess without bleeding: Secondary | ICD-10-CM | POA: Diagnosis not present

## 2019-04-16 DIAGNOSIS — I700 Atherosclerosis of aorta: Secondary | ICD-10-CM | POA: Diagnosis not present

## 2019-04-17 ENCOUNTER — Encounter: Payer: Self-pay | Admitting: Physician Assistant

## 2019-04-17 ENCOUNTER — Telehealth: Payer: Self-pay | Admitting: Emergency Medicine

## 2019-04-17 DIAGNOSIS — K5792 Diverticulitis of intestine, part unspecified, without perforation or abscess without bleeding: Secondary | ICD-10-CM | POA: Diagnosis not present

## 2019-04-17 DIAGNOSIS — E041 Nontoxic single thyroid nodule: Secondary | ICD-10-CM | POA: Diagnosis not present

## 2019-04-17 DIAGNOSIS — I742 Embolism and thrombosis of arteries of the upper extremities: Secondary | ICD-10-CM

## 2019-04-17 DIAGNOSIS — Z48812 Encounter for surgical aftercare following surgery on the circulatory system: Secondary | ICD-10-CM | POA: Diagnosis not present

## 2019-04-17 DIAGNOSIS — I2602 Saddle embolus of pulmonary artery with acute cor pulmonale: Secondary | ICD-10-CM | POA: Diagnosis not present

## 2019-04-17 DIAGNOSIS — I724 Aneurysm of artery of lower extremity: Secondary | ICD-10-CM

## 2019-04-17 DIAGNOSIS — T81718A Complication of other artery following a procedure, not elsewhere classified, initial encounter: Secondary | ICD-10-CM

## 2019-04-17 DIAGNOSIS — G43909 Migraine, unspecified, not intractable, without status migrainosus: Secondary | ICD-10-CM | POA: Diagnosis not present

## 2019-04-17 DIAGNOSIS — I700 Atherosclerosis of aorta: Secondary | ICD-10-CM | POA: Diagnosis not present

## 2019-04-17 MED ORDER — ENOXAPARIN SODIUM 150 MG/ML ~~LOC~~ SOLN: SUBCUTANEOUS | 0 refills | Status: DC

## 2019-04-17 NOTE — Telephone Encounter (Signed)
Since goal INR was 2.5 and she is not yet at 2, I recommend that she continue the Lovenox until at goal.  Since she had a PE, we want to make sure that we follow directions.  I understand the bruising of the abdomen, this is common.  Please get repeat PT/INR tomorrow (Friday) and PCP can determine next steps based on that result

## 2019-04-17 NOTE — Addendum Note (Signed)
Addended by: Leonidas Romberg on: 04/17/2019 04:12 PM   Modules accepted: Orders

## 2019-04-17 NOTE — Telephone Encounter (Signed)
Leslie Knapp with [facility] calling with PT/INR results PT: 23.1 INR: 1.9  Patient is complaining about abdominal bruising and painful from the Lovenox injections  Patient has an appointment today with [facility] today.  Nurse will need another verbal order for the nurse to come to the house for PT/INR order.

## 2019-04-17 NOTE — Telephone Encounter (Signed)
Left a detailed message on Patti, [place] nurse for patient to continue her Lovenox injections until she is at goal 2.5 Refill of the Lovenox sent to the pharmacy. Gave verbal orders to recheck INR level tomorrow Friday. Will have PCP determine next steps.

## 2019-04-17 NOTE — Telephone Encounter (Signed)
Please advise in PCP absence.  

## 2019-04-18 ENCOUNTER — Other Ambulatory Visit: Payer: Self-pay | Admitting: Physician Assistant

## 2019-04-18 ENCOUNTER — Telehealth: Payer: Self-pay

## 2019-04-18 DIAGNOSIS — I700 Atherosclerosis of aorta: Secondary | ICD-10-CM | POA: Diagnosis not present

## 2019-04-18 DIAGNOSIS — Z48812 Encounter for surgical aftercare following surgery on the circulatory system: Secondary | ICD-10-CM | POA: Diagnosis not present

## 2019-04-18 DIAGNOSIS — I749 Embolism and thrombosis of unspecified artery: Secondary | ICD-10-CM | POA: Insufficient documentation

## 2019-04-18 DIAGNOSIS — E041 Nontoxic single thyroid nodule: Secondary | ICD-10-CM | POA: Diagnosis not present

## 2019-04-18 DIAGNOSIS — G43909 Migraine, unspecified, not intractable, without status migrainosus: Secondary | ICD-10-CM | POA: Diagnosis not present

## 2019-04-18 DIAGNOSIS — K5792 Diverticulitis of intestine, part unspecified, without perforation or abscess without bleeding: Secondary | ICD-10-CM | POA: Diagnosis not present

## 2019-04-18 DIAGNOSIS — I2602 Saddle embolus of pulmonary artery with acute cor pulmonale: Secondary | ICD-10-CM

## 2019-04-18 NOTE — Progress Notes (Signed)
ref

## 2019-04-18 NOTE — Telephone Encounter (Signed)
Stop the Lovenox since she is at 2.1 now (needed her at least at 2 but prefer 2.5-3) and having significant issue tolerating shots. For coumadin, would increase tomorrows dose to 2 tablets. Continue same dose for other days. Recheck PT/INR Monday

## 2019-04-18 NOTE — Telephone Encounter (Signed)
Katie aware of new medication orders. Wanted to know if OK to continue care, original order has expired. Also requesting new script for Coumadin. Patient is still experiencing burning while urinating. Can a script be sent in for the weekend and have urine rechecked Monday at [facility]? I informed Joellen Jersey that I would route Einar Pheasant a message. Please advise

## 2019-04-18 NOTE — Telephone Encounter (Signed)
Katie from [facility] [phone]  PT - 24.7 INR - 2.1   Wanting to know if at all possible to up Coumadin dosage so that they can stop the Lovenox? Joellen Jersey stated that patient is extremely bruised from injections. Please advise

## 2019-04-18 NOTE — Telephone Encounter (Signed)
Have already responded to this concern (UTI symptoms) via MyChart to patient.   Ok to continue [facility] care.

## 2019-04-19 ENCOUNTER — Encounter: Payer: Self-pay | Admitting: Physician Assistant

## 2019-04-19 MED ORDER — CEFDINIR 300 MG PO CAPS: 300.0000 mg | ORAL_CAPSULE | Freq: Two times a day (BID) | ORAL | 0 refills | Status: DC

## 2019-04-21 ENCOUNTER — Telehealth: Payer: Self-pay

## 2019-04-21 DIAGNOSIS — E041 Nontoxic single thyroid nodule: Secondary | ICD-10-CM | POA: Diagnosis not present

## 2019-04-21 DIAGNOSIS — K5792 Diverticulitis of intestine, part unspecified, without perforation or abscess without bleeding: Secondary | ICD-10-CM | POA: Diagnosis not present

## 2019-04-21 DIAGNOSIS — I2602 Saddle embolus of pulmonary artery with acute cor pulmonale: Secondary | ICD-10-CM | POA: Diagnosis not present

## 2019-04-21 DIAGNOSIS — G43909 Migraine, unspecified, not intractable, without status migrainosus: Secondary | ICD-10-CM | POA: Diagnosis not present

## 2019-04-21 DIAGNOSIS — I700 Atherosclerosis of aorta: Secondary | ICD-10-CM | POA: Diagnosis not present

## 2019-04-21 DIAGNOSIS — Z48812 Encounter for surgical aftercare following surgery on the circulatory system: Secondary | ICD-10-CM | POA: Diagnosis not present

## 2019-04-21 NOTE — Telephone Encounter (Signed)
Cefdnir is a safe and well-tolerated medication. Less side effects that a lot of the other options. Most all of the antibiotics are going to effect her coumadin levels. The main thing is just that we keep close eye on it. The antibiotic was sent in due to their persistent request and mention of recurring urinary symptoms. I am fine if they want to wait for a repeat urine culture before taking medication.   We increased one dose of her coumadin to try to get her to therapeutic range more quickly. She needs to take her 7.5 mg daily now, no more increased doses. Repeat INR Wednesday.

## 2019-04-21 NOTE — Telephone Encounter (Signed)
Precious Bard with [facility] [phone]  called in: PT: 41.3 INR: 3.4  States that both patient and her sister have concerns over some of the listed side effects (bleeding) of the antibiotic called in.    Requesting that they bring in a urine sample for a culture. Informed Patti that I would route message to PCP. Please advise.

## 2019-04-21 NOTE — Telephone Encounter (Signed)
Informed Leslie Knapp of instructions per PCP. Gave verbal understanding to return to Coumadin 7.5 mg daily, repeat PT/INR Wednesday, and going to get supplies from [facility] for urine culture.

## 2019-04-22 ENCOUNTER — Other Ambulatory Visit: Payer: Self-pay

## 2019-04-22 ENCOUNTER — Other Ambulatory Visit: Payer: Medicare Other

## 2019-04-22 DIAGNOSIS — K5792 Diverticulitis of intestine, part unspecified, without perforation or abscess without bleeding: Secondary | ICD-10-CM | POA: Diagnosis not present

## 2019-04-22 DIAGNOSIS — I2602 Saddle embolus of pulmonary artery with acute cor pulmonale: Secondary | ICD-10-CM | POA: Diagnosis not present

## 2019-04-22 DIAGNOSIS — Z9181 History of falling: Secondary | ICD-10-CM | POA: Diagnosis not present

## 2019-04-22 DIAGNOSIS — R300 Dysuria: Secondary | ICD-10-CM | POA: Diagnosis not present

## 2019-04-22 DIAGNOSIS — I724 Aneurysm of artery of lower extremity: Secondary | ICD-10-CM | POA: Diagnosis not present

## 2019-04-22 DIAGNOSIS — I700 Atherosclerosis of aorta: Secondary | ICD-10-CM | POA: Diagnosis not present

## 2019-04-22 DIAGNOSIS — Z7901 Long term (current) use of anticoagulants: Secondary | ICD-10-CM | POA: Diagnosis not present

## 2019-04-22 DIAGNOSIS — G43909 Migraine, unspecified, not intractable, without status migrainosus: Secondary | ICD-10-CM | POA: Diagnosis not present

## 2019-04-22 DIAGNOSIS — Z7982 Long term (current) use of aspirin: Secondary | ICD-10-CM | POA: Diagnosis not present

## 2019-04-22 DIAGNOSIS — Z48812 Encounter for surgical aftercare following surgery on the circulatory system: Secondary | ICD-10-CM | POA: Diagnosis not present

## 2019-04-22 DIAGNOSIS — Z6841 Body Mass Index (BMI) 40.0 and over, adult: Secondary | ICD-10-CM | POA: Diagnosis not present

## 2019-04-22 DIAGNOSIS — E041 Nontoxic single thyroid nodule: Secondary | ICD-10-CM | POA: Diagnosis not present

## 2019-04-22 DIAGNOSIS — Z5181 Encounter for therapeutic drug level monitoring: Secondary | ICD-10-CM | POA: Diagnosis not present

## 2019-04-23 ENCOUNTER — Telehealth: Payer: Self-pay | Admitting: *Deleted

## 2019-04-23 DIAGNOSIS — I700 Atherosclerosis of aorta: Secondary | ICD-10-CM | POA: Diagnosis not present

## 2019-04-23 DIAGNOSIS — E041 Nontoxic single thyroid nodule: Secondary | ICD-10-CM | POA: Diagnosis not present

## 2019-04-23 DIAGNOSIS — Z48812 Encounter for surgical aftercare following surgery on the circulatory system: Secondary | ICD-10-CM | POA: Diagnosis not present

## 2019-04-23 DIAGNOSIS — I724 Aneurysm of artery of lower extremity: Secondary | ICD-10-CM | POA: Diagnosis not present

## 2019-04-23 DIAGNOSIS — I2602 Saddle embolus of pulmonary artery with acute cor pulmonale: Secondary | ICD-10-CM | POA: Diagnosis not present

## 2019-04-23 DIAGNOSIS — K5792 Diverticulitis of intestine, part unspecified, without perforation or abscess without bleeding: Secondary | ICD-10-CM | POA: Diagnosis not present

## 2019-04-23 NOTE — Telephone Encounter (Signed)
Continue current regimen. Recheck INR in 1 week. If stable will space out INR checks further.

## 2019-04-23 NOTE — Telephone Encounter (Signed)
Lavella Hammock, RN  with [facility] called to report the INR today.   INR Result: 2.9    Current med:  7.5mg  Coumadin daily Aspirin 81mg  daily  Starla asks that we call back with any changes as well as when to recheck.  If you get her VM, okay to leave message as this is a secure VM.  CB: [phone]    Routing to provider to advise.

## 2019-04-23 NOTE — Telephone Encounter (Signed)
Adela Glimpse, RN at [facility] of continue current coumadin level. Recheck INR level in 1 week per PCP

## 2019-04-24 LAB — URINE CULTURE
MICRO NUMBER:: 984355
SPECIMEN QUALITY:: ADEQUATE

## 2019-04-25 ENCOUNTER — Telehealth: Payer: Self-pay | Admitting: Physician Assistant

## 2019-04-25 DIAGNOSIS — T148XXA Other injury of unspecified body region, initial encounter: Secondary | ICD-10-CM | POA: Diagnosis not present

## 2019-04-25 NOTE — Telephone Encounter (Signed)
I have placed [facility] orders in the bin up front with a charge sheet.

## 2019-04-25 NOTE — Telephone Encounter (Signed)
Paperwork given to PCP for review.  

## 2019-04-27 ENCOUNTER — Encounter: Payer: Self-pay | Admitting: Physician Assistant

## 2019-04-28 DIAGNOSIS — Z48812 Encounter for surgical aftercare following surgery on the circulatory system: Secondary | ICD-10-CM

## 2019-04-28 DIAGNOSIS — I724 Aneurysm of artery of lower extremity: Secondary | ICD-10-CM

## 2019-04-28 DIAGNOSIS — I2602 Saddle embolus of pulmonary artery with acute cor pulmonale: Secondary | ICD-10-CM

## 2019-04-28 NOTE — Telephone Encounter (Signed)
Fyi.

## 2019-04-28 NOTE — Telephone Encounter (Signed)
Form completed and placed in basket  

## 2019-04-28 NOTE — Telephone Encounter (Signed)
Reviewed and forwarded to supervising MD for signature.

## 2019-04-28 NOTE — Telephone Encounter (Signed)
Picked up from the back, faxed to the # on forms and sent to scan

## 2019-04-29 ENCOUNTER — Telehealth: Payer: Self-pay

## 2019-04-29 NOTE — Telephone Encounter (Signed)
Patient called in stating that Precious Bard the home health nurse has suggested that patient get an in home coagulant machine to check her INR after [facility] orders run out in 3 weeks, Nov 11th. Patient states that it is very difficult to get out of the house currently and it would be much more convenient to be able to test at home. Please advise

## 2019-04-29 NOTE — Telephone Encounter (Signed)
I am fine with her testing at home but will still have her see the coagulation clinic who she will report the levels to (they may be able to set her up with a machine that will automatically send results to them) and who will make adjustments to coumadin therapy. This is done through our [facility] cou[facility]in clinics.

## 2019-04-29 NOTE — Telephone Encounter (Signed)
Spoke with patient, informed of recommendations per PCP. Advised that referral is being sent to coumadin clinic.

## 2019-04-30 ENCOUNTER — Ambulatory Visit (INDEPENDENT_AMBULATORY_CARE_PROVIDER_SITE_OTHER): Payer: Medicare Other | Admitting: General Practice

## 2019-04-30 DIAGNOSIS — I700 Atherosclerosis of aorta: Secondary | ICD-10-CM | POA: Diagnosis not present

## 2019-04-30 DIAGNOSIS — I724 Aneurysm of artery of lower extremity: Secondary | ICD-10-CM | POA: Diagnosis not present

## 2019-04-30 DIAGNOSIS — K5792 Diverticulitis of intestine, part unspecified, without perforation or abscess without bleeding: Secondary | ICD-10-CM | POA: Diagnosis not present

## 2019-04-30 DIAGNOSIS — Z7901 Long term (current) use of anticoagulants: Secondary | ICD-10-CM | POA: Diagnosis not present

## 2019-04-30 DIAGNOSIS — E041 Nontoxic single thyroid nodule: Secondary | ICD-10-CM | POA: Diagnosis not present

## 2019-04-30 DIAGNOSIS — I2602 Saddle embolus of pulmonary artery with acute cor pulmonale: Secondary | ICD-10-CM | POA: Diagnosis not present

## 2019-04-30 DIAGNOSIS — I749 Embolism and thrombosis of unspecified artery: Secondary | ICD-10-CM | POA: Diagnosis not present

## 2019-04-30 DIAGNOSIS — Z48812 Encounter for surgical aftercare following surgery on the circulatory system: Secondary | ICD-10-CM | POA: Diagnosis not present

## 2019-04-30 LAB — POCT INR
INR: 3.7 — AB (ref 2.0–3.0)
INR: 4.9 — AB (ref 2.0–3.0)

## 2019-04-30 NOTE — Progress Notes (Signed)
I have reviewed this visit and I agree on the patient's plan of dosage and recommendations. Georgiana Spillane, DO   

## 2019-04-30 NOTE — Patient Instructions (Addendum)
Pre visit review using our clinic review tool, if applicable. No additional management support is needed unless otherwise documented below in the visit note.  Please hold coumadin today and then take 7.5 through next Tuesday and re-check on Wednesday.  Dosing instructions given to patient and to [place], RN @ [facility] [phone].  Both verbalized understanding.

## 2019-05-02 ENCOUNTER — Telehealth: Payer: Self-pay | Admitting: General Practice

## 2019-05-02 DIAGNOSIS — I700 Atherosclerosis of aorta: Secondary | ICD-10-CM | POA: Diagnosis not present

## 2019-05-02 DIAGNOSIS — Z48812 Encounter for surgical aftercare following surgery on the circulatory system: Secondary | ICD-10-CM | POA: Diagnosis not present

## 2019-05-02 DIAGNOSIS — I2602 Saddle embolus of pulmonary artery with acute cor pulmonale: Secondary | ICD-10-CM | POA: Diagnosis not present

## 2019-05-02 DIAGNOSIS — I724 Aneurysm of artery of lower extremity: Secondary | ICD-10-CM | POA: Diagnosis not present

## 2019-05-02 DIAGNOSIS — K5792 Diverticulitis of intestine, part unspecified, without perforation or abscess without bleeding: Secondary | ICD-10-CM | POA: Diagnosis not present

## 2019-05-02 DIAGNOSIS — E041 Nontoxic single thyroid nodule: Secondary | ICD-10-CM | POA: Diagnosis not present

## 2019-05-02 NOTE — Telephone Encounter (Signed)
FYI. Verbal ok given for:   OT 1x week/2 more weeks PT evaluation

## 2019-05-05 DIAGNOSIS — I2602 Saddle embolus of pulmonary artery with acute cor pulmonale: Secondary | ICD-10-CM | POA: Diagnosis not present

## 2019-05-05 DIAGNOSIS — Z48812 Encounter for surgical aftercare following surgery on the circulatory system: Secondary | ICD-10-CM | POA: Diagnosis not present

## 2019-05-05 DIAGNOSIS — I724 Aneurysm of artery of lower extremity: Secondary | ICD-10-CM | POA: Diagnosis not present

## 2019-05-05 DIAGNOSIS — I700 Atherosclerosis of aorta: Secondary | ICD-10-CM | POA: Diagnosis not present

## 2019-05-05 DIAGNOSIS — E041 Nontoxic single thyroid nodule: Secondary | ICD-10-CM | POA: Diagnosis not present

## 2019-05-05 DIAGNOSIS — K5792 Diverticulitis of intestine, part unspecified, without perforation or abscess without bleeding: Secondary | ICD-10-CM | POA: Diagnosis not present

## 2019-05-07 ENCOUNTER — Telehealth: Payer: Self-pay | Admitting: Physician Assistant

## 2019-05-07 ENCOUNTER — Ambulatory Visit (INDEPENDENT_AMBULATORY_CARE_PROVIDER_SITE_OTHER): Payer: Medicare Other | Admitting: General Practice

## 2019-05-07 DIAGNOSIS — Z7901 Long term (current) use of anticoagulants: Secondary | ICD-10-CM | POA: Diagnosis not present

## 2019-05-07 DIAGNOSIS — I2602 Saddle embolus of pulmonary artery with acute cor pulmonale: Secondary | ICD-10-CM | POA: Diagnosis not present

## 2019-05-07 DIAGNOSIS — I724 Aneurysm of artery of lower extremity: Secondary | ICD-10-CM | POA: Diagnosis not present

## 2019-05-07 DIAGNOSIS — Z48812 Encounter for surgical aftercare following surgery on the circulatory system: Secondary | ICD-10-CM | POA: Diagnosis not present

## 2019-05-07 DIAGNOSIS — I700 Atherosclerosis of aorta: Secondary | ICD-10-CM | POA: Diagnosis not present

## 2019-05-07 DIAGNOSIS — E041 Nontoxic single thyroid nodule: Secondary | ICD-10-CM | POA: Diagnosis not present

## 2019-05-07 DIAGNOSIS — K5792 Diverticulitis of intestine, part unspecified, without perforation or abscess without bleeding: Secondary | ICD-10-CM | POA: Diagnosis not present

## 2019-05-07 LAB — POCT INR: INR: 2.6 (ref 2.0–3.0)

## 2019-05-07 NOTE — Progress Notes (Signed)
I have reviewed and agree with note, evaluation, plan.   Lorren Splawn, MD  

## 2019-05-07 NOTE — Telephone Encounter (Signed)
Forms in your folder for review 

## 2019-05-07 NOTE — Patient Instructions (Signed)
Pre visit review using our clinic review tool, if applicable. No additional management support is needed unless otherwise documented below in the visit note.  Please take 7.5 mg daily except 5 mg on Monday and Fridays.  Dosing instructions given to patient and to [place], RN @ [facility] [phone].  Both verbalized understanding.

## 2019-05-07 NOTE — Telephone Encounter (Signed)
I have placed a plan of care from [facility] in the bin upfront with a charge sheet  °

## 2019-05-08 ENCOUNTER — Encounter: Payer: Self-pay | Admitting: Physician Assistant

## 2019-05-08 DIAGNOSIS — I700 Atherosclerosis of aorta: Secondary | ICD-10-CM | POA: Diagnosis not present

## 2019-05-08 DIAGNOSIS — E041 Nontoxic single thyroid nodule: Secondary | ICD-10-CM | POA: Diagnosis not present

## 2019-05-08 DIAGNOSIS — I724 Aneurysm of artery of lower extremity: Secondary | ICD-10-CM | POA: Diagnosis not present

## 2019-05-08 DIAGNOSIS — Z48812 Encounter for surgical aftercare following surgery on the circulatory system: Secondary | ICD-10-CM | POA: Diagnosis not present

## 2019-05-08 DIAGNOSIS — K5792 Diverticulitis of intestine, part unspecified, without perforation or abscess without bleeding: Secondary | ICD-10-CM | POA: Diagnosis not present

## 2019-05-08 DIAGNOSIS — I2602 Saddle embolus of pulmonary artery with acute cor pulmonale: Secondary | ICD-10-CM | POA: Diagnosis not present

## 2019-05-08 NOTE — Telephone Encounter (Signed)
Picked up faxed and sent to scan

## 2019-05-08 NOTE — Telephone Encounter (Signed)
Form completed and placed in basket  

## 2019-05-08 NOTE — Telephone Encounter (Signed)
Forms reviewed and given to supervising MD for signature.

## 2019-05-08 NOTE — Telephone Encounter (Signed)
FYI

## 2019-05-13 ENCOUNTER — Telehealth: Payer: Self-pay | Admitting: Physician Assistant

## 2019-05-13 NOTE — Telephone Encounter (Signed)
Per Einar Pheasant gave the ok for PT eval for the pt.

## 2019-05-15 DIAGNOSIS — I700 Atherosclerosis of aorta: Secondary | ICD-10-CM | POA: Diagnosis not present

## 2019-05-15 DIAGNOSIS — I2602 Saddle embolus of pulmonary artery with acute cor pulmonale: Secondary | ICD-10-CM | POA: Diagnosis not present

## 2019-05-15 DIAGNOSIS — Z48812 Encounter for surgical aftercare following surgery on the circulatory system: Secondary | ICD-10-CM | POA: Diagnosis not present

## 2019-05-15 DIAGNOSIS — K5792 Diverticulitis of intestine, part unspecified, without perforation or abscess without bleeding: Secondary | ICD-10-CM | POA: Diagnosis not present

## 2019-05-15 DIAGNOSIS — E041 Nontoxic single thyroid nodule: Secondary | ICD-10-CM | POA: Diagnosis not present

## 2019-05-15 DIAGNOSIS — I724 Aneurysm of artery of lower extremity: Secondary | ICD-10-CM | POA: Diagnosis not present

## 2019-05-16 ENCOUNTER — Other Ambulatory Visit: Payer: Self-pay | Admitting: Family

## 2019-05-16 DIAGNOSIS — I749 Embolism and thrombosis of unspecified artery: Secondary | ICD-10-CM

## 2019-05-16 DIAGNOSIS — I2602 Saddle embolus of pulmonary artery with acute cor pulmonale: Secondary | ICD-10-CM

## 2019-05-16 DIAGNOSIS — Z862 Personal history of diseases of the blood and blood-forming organs and certain disorders involving the immune mechanism: Secondary | ICD-10-CM

## 2019-05-19 ENCOUNTER — Inpatient Hospital Stay: Payer: Medicare Other | Attending: Family

## 2019-05-19 ENCOUNTER — Other Ambulatory Visit: Payer: Self-pay

## 2019-05-19 ENCOUNTER — Encounter: Payer: Self-pay | Admitting: Family

## 2019-05-19 ENCOUNTER — Inpatient Hospital Stay (HOSPITAL_BASED_OUTPATIENT_CLINIC_OR_DEPARTMENT_OTHER): Payer: Medicare Other | Admitting: Family

## 2019-05-19 VITALS — BP 149/86 | HR 94 | Temp 97.1°F | Resp 19 | Ht 70.0 in | Wt 322.1 lb

## 2019-05-19 DIAGNOSIS — M79652 Pain in left thigh: Secondary | ICD-10-CM | POA: Diagnosis not present

## 2019-05-19 DIAGNOSIS — R200 Anesthesia of skin: Secondary | ICD-10-CM | POA: Diagnosis not present

## 2019-05-19 DIAGNOSIS — Z886 Allergy status to analgesic agent status: Secondary | ICD-10-CM | POA: Diagnosis not present

## 2019-05-19 DIAGNOSIS — I749 Embolism and thrombosis of unspecified artery: Secondary | ICD-10-CM

## 2019-05-19 DIAGNOSIS — M79601 Pain in right arm: Secondary | ICD-10-CM | POA: Insufficient documentation

## 2019-05-19 DIAGNOSIS — E860 Dehydration: Secondary | ICD-10-CM | POA: Diagnosis not present

## 2019-05-19 DIAGNOSIS — Z8379 Family history of other diseases of the digestive system: Secondary | ICD-10-CM | POA: Diagnosis not present

## 2019-05-19 DIAGNOSIS — Q211 Atrial septal defect: Secondary | ICD-10-CM

## 2019-05-19 DIAGNOSIS — I742 Embolism and thrombosis of arteries of the upper extremities: Secondary | ICD-10-CM

## 2019-05-19 DIAGNOSIS — Z818 Family history of other mental and behavioral disorders: Secondary | ICD-10-CM | POA: Diagnosis not present

## 2019-05-19 DIAGNOSIS — Z8261 Family history of arthritis: Secondary | ICD-10-CM | POA: Diagnosis not present

## 2019-05-19 DIAGNOSIS — I724 Aneurysm of artery of lower extremity: Secondary | ICD-10-CM | POA: Insufficient documentation

## 2019-05-19 DIAGNOSIS — K760 Fatty (change of) liver, not elsewhere classified: Secondary | ICD-10-CM

## 2019-05-19 DIAGNOSIS — M1990 Unspecified osteoarthritis, unspecified site: Secondary | ICD-10-CM | POA: Insufficient documentation

## 2019-05-19 DIAGNOSIS — I2692 Saddle embolus of pulmonary artery without acute cor pulmonale: Secondary | ICD-10-CM | POA: Insufficient documentation

## 2019-05-19 DIAGNOSIS — Z862 Personal history of diseases of the blood and blood-forming organs and certain disorders involving the immune mechanism: Secondary | ICD-10-CM | POA: Diagnosis not present

## 2019-05-19 DIAGNOSIS — I2602 Saddle embolus of pulmonary artery with acute cor pulmonale: Secondary | ICD-10-CM | POA: Diagnosis not present

## 2019-05-19 DIAGNOSIS — Z7901 Long term (current) use of anticoagulants: Secondary | ICD-10-CM | POA: Diagnosis not present

## 2019-05-19 DIAGNOSIS — Z79899 Other long term (current) drug therapy: Secondary | ICD-10-CM

## 2019-05-19 DIAGNOSIS — Z8249 Family history of ischemic heart disease and other diseases of the circulatory system: Secondary | ICD-10-CM | POA: Diagnosis not present

## 2019-05-19 DIAGNOSIS — Z7982 Long term (current) use of aspirin: Secondary | ICD-10-CM | POA: Insufficient documentation

## 2019-05-19 DIAGNOSIS — Z823 Family history of stroke: Secondary | ICD-10-CM | POA: Insufficient documentation

## 2019-05-19 LAB — CBC WITH DIFFERENTIAL (CANCER CENTER ONLY)
Abs Immature Granulocytes: 0.01 10*3/uL (ref 0.00–0.07)
Basophils Absolute: 0.1 10*3/uL (ref 0.0–0.1)
Basophils Relative: 1 %
Eosinophils Absolute: 0.3 10*3/uL (ref 0.0–0.5)
Eosinophils Relative: 4 %
HCT: 45.4 % (ref 36.0–46.0)
Hemoglobin: 14.3 g/dL (ref 12.0–15.0)
Immature Granulocytes: 0 %
Lymphocytes Relative: 36 %
Lymphs Abs: 2.6 10*3/uL (ref 0.7–4.0)
MCH: 28.9 pg (ref 26.0–34.0)
MCHC: 31.5 g/dL (ref 30.0–36.0)
MCV: 91.7 fL (ref 80.0–100.0)
Monocytes Absolute: 0.4 10*3/uL (ref 0.1–1.0)
Monocytes Relative: 5 %
Neutro Abs: 3.9 10*3/uL (ref 1.7–7.7)
Neutrophils Relative %: 54 %
Platelet Count: 267 10*3/uL (ref 150–400)
RBC: 4.95 MIL/uL (ref 3.87–5.11)
RDW: 14 % (ref 11.5–15.5)
WBC Count: 7.2 10*3/uL (ref 4.0–10.5)
nRBC: 0 % (ref 0.0–0.2)

## 2019-05-19 LAB — CMP (CANCER CENTER ONLY)
ALT: 14 U/L (ref 0–44)
AST: 21 U/L (ref 15–41)
Albumin: 4.2 g/dL (ref 3.5–5.0)
Alkaline Phosphatase: 88 U/L (ref 38–126)
Anion gap: 9 (ref 5–15)
BUN: 10 mg/dL (ref 8–23)
CO2: 26 mmol/L (ref 22–32)
Calcium: 9.7 mg/dL (ref 8.9–10.3)
Chloride: 102 mmol/L (ref 98–111)
Creatinine: 0.72 mg/dL (ref 0.44–1.00)
GFR, Est AFR Am: >60 mL/min (ref >60)
GFR, Estimated: >60 mL/min (ref >60)
Glucose, Bld: 92 mg/dL (ref 70–99)
Potassium: 4 mmol/L (ref 3.5–5.1)
Sodium: 137 mmol/L (ref 135–145)
Total Bilirubin: 0.4 mg/dL (ref 0.3–1.2)
Total Protein: 6.8 g/dL (ref 6.5–8.1)

## 2019-05-19 LAB — LACTATE DEHYDROGENASE: LDH: 255 U/L — ABNORMAL HIGH (ref 98–192)

## 2019-05-19 LAB — D-DIMER, QUANTITATIVE: D-Dimer, Quant: 1.12 ug/mL-FEU — ABNORMAL HIGH (ref 0.00–0.50)

## 2019-05-19 LAB — ANTITHROMBIN III: AntiThromb III Func: 109 % (ref 75–120)

## 2019-05-19 NOTE — Progress Notes (Signed)
Hematology/Oncology Consultation   Name: Leslie Knapp      MRN: [id number]    Location: Room/bed info not found  Date: 05/19/2019 Time:1:46 PM   REFERRING PHYSICIAN: Annye Asa, MD  REASON FOR CONSULT: Acute saddle pulmonary embolism   DIAGNOSIS: Saddle pulmonary emboli with right heart strain and right brachial artery thrombus   HISTORY OF PRESENT ILLNESS: Leslie Knapp is a very pleasant 68 yo caucasian female with recent diagnosis of bilateral saddle pulmonary emboli with right heart strain as well as a right brachial artery thrombus after experiencing increased SOB and pain and weakness in her right arm.  She was initially treated with Heparin during admission and had EKOS thrombolysis performed. She had an IVC filter placed on 9/4 followed by thromboembolectomy of the right brachial, radial and ulnar arteries.  She was bridged with Lovenox and then transitioned to Coumadin as well as 1 baby aspirin daily.  After discharge she developed pain and swelling in her left leg and was diagnosed with a left femoral pseudoaneurysm treated with two thrombin injections.  She was also found to have a PFO that required closure on 9/11. This was felt to be the source of her right brachial artery thrombus.  She states that in April of this year she had bilateral leg swelling but [place] at that time was negative for DVT.  She was treated with diuretics and also tried some alternative treatments such as cupping.  She states that dehydration with the diuretics eventually led to a bout with diverticulitis and treatment with antibiotics. 3 days later she developed the progressive SOB and her right arm turned white and became painful.  She has no prior history of thrombus. She states that there is no known family history of clotting but that her mother did have a stroke after surgery.  She has been doing magnetic therapy to help stimulate blood flow.  She still has a palpable hematoma under the right arm and in  the left groin. She states that she had these measured 2 weeks ago by vascular surgery and they continue to decrease in size.  She is still having left thigh discomfort due to the hematoma and is not able to drive at this time.  She is doing OT at home during the week. She typically enjoys walking 8 laps for exercise daily but has cut back to 5 as due to her lower energy threshold. She has been favoring her left leg and her right leg has been a little sore.  She feels that her energy is improved and the color/strength in her right upper extremity strength is also better. The numbness in her right thumb and pointer finger tips is better.   She had not travelled or sustained an injury.  She does not smoke, drink alcoholic beverages or use recreational drugs.  She has not been on any hormone replacement therapy.  No thyroid disease or diabetes.  She had been having n/v after eating and is now in a a study with [facility] monitoring her for fatty liver. She states that due to her body habitus they included her despite her not having developed a fatty liver yet.  She states that she is due for her colonoscopy and would like to go somewhere in the [place] area once she is ready. She will let us know when she is ready for the referral.  No fever, chills, n/v, cough, rash, dizziness, SOB, chest pain, palpitations, abdominal pain or changes in bowel or bladder habits.  No falls or syncopal episodes to report.  She is telerating her anticoagulation nicely and has not had any issues with bleeds. No unusual or excessive bruising, no petechiae.  She has maintained a good appetite and is staying well hydrated. Her weight is described as stable.   ROS: All other 10 point review of systems is negative.   PAST MEDICAL HISTORY:   Past Medical History:  Diagnosis Date  . CTS (carpal tunnel syndrome)   . Fatty liver disease, nonalcoholic    In [facility]  . IBS (irritable bowel syndrome)    C/D   . Morbid obesity ([place])   . Osteoarthritis     ALLERGIES: Allergies  Allergen Reactions  . Lactose Intolerance (Gi)   . Celebrex [Celecoxib] Swelling and Rash      MEDICATIONS:  Current Outpatient Medications on File Prior to Visit  Medication Sig Dispense Refill  . aspirin EC 81 MG tablet Take 1 tablet (81 mg total) by mouth daily. 90 tablet 1  . aspirin-acetaminophen-caffeine (EXCEDRIN MIGRAINE) [id number] MG per tablet Take 2 tablets by mouth every 6 (six) hours as needed for headache.    Marland Kitchen atorvastatin (LIPITOR) 10 MG tablet Take by mouth.    . Calcium Carbonate 1500 (600 CA) MG TABS Take 2 tablets by mouth daily.    . cefdinir (OMNICEF) 300 MG capsule Take 1 capsule (300 mg total) by mouth 2 (two) times daily. 14 capsule 0  . Cholecalciferol (VITAMIN D-3) 1000 UNITS CAPS Take 1 each by mouth daily.    . clotrimazole-betamethasone (LOTRISONE) cream Apply 1 application topically 2 (two) times daily. For up to 2 weeks. 30 g 0  . Digestive Enzymes (ENZYME DIGEST) CAPS Take by mouth.    . diphenhydramine-acetaminophen (TYLENOL PM) 25-500 MG TABS Take 2 tablets by mouth at bedtime as needed (Pain & Sleep).    . Diphenhydramine-APAP 25-500 MG/15ML LIQD Take by mouth.    . enoxaparin (LOVENOX) 150 MG/ML injection Inject 0.93 mls into the skin every 12 hrs. 20 mL 0  . furosemide (LASIX) 20 MG tablet Take 1 tablet (20 mg total) by mouth daily. 30 tablet 3  . HYDROcodone-acetaminophen (NORCO/VICODIN) 5-325 MG tablet Take 1-2 tablets by mouth every 6 (six) hours as needed. 8 tablet 0  . NON FORMULARY ROLFING: Deep Tissue Massage, 2 Txs weekly as needed for joint pain    . ondansetron (ZOFRAN ODT) 4 MG disintegrating tablet Take 1 tablet (4 mg total) by mouth every 8 (eight) hours as needed for nausea or vomiting. 14 tablet 0  . polyethylene glycol (MIRALAX / GLYCOLAX) 17 g packet Take 17 g by mouth daily. 14 each 0  . Probiotic Product (PROBIOTIC-10 PO) Take 1 tablet by mouth daily.    Colbert Coyer Min [id number] (MINERALS PO) Take 1 oz by mouth daily.    Marland Kitchen warfarin (COUMADIN) 5 MG tablet Take 1.5 tablet by mouth daily at 6 pm. 45 tablet 6   No current facility-administered medications on file prior to visit.      PAST SURGICAL HISTORY Past Surgical History:  Procedure Laterality Date  . KNEE SURGERY  1975   Left Patella Tendon Replacement  . TONSILLECTOMY  1963  . 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 TOTAL KNEE ARTHROPLASTY  11.2005   Right  . UTERINE POLYP REMOVAL  2010    FAMILY HISTORY: Family History  Problem Relation Age of Onset  . Stroke Mother 76       Deceased  .  Hypertension Mother   . GI problems Mother   . Arthritis Mother   . Dementia Mother   . Heart defect Father 34       Deceased  . Parkinson's disease Father   . Varicose Veins Father   . Stroke Maternal Grandfather   . Arthritis Maternal Grandfather   . Kidney failure Maternal Grandmother   . Gallbladder disease Maternal Grandmother   . Diverticulitis Sister        #1  . Arthritis Sister   . Healthy Sister        #2    SOCIAL HISTORY:  reports that she has never smoked. She has never used smokeless tobacco. She reports that she does not drink alcohol or use drugs.  PERFORMANCE STATUS: The patient's performance status is 1 - Symptomatic but completely ambulatory  PHYSICAL EXAM: Most Recent Vital Signs: There were no vitals taken for this visit. There were no vitals taken for this visit.  General Appearance:    Alert, cooperative, no distress, appears stated age  Head:    Normocephalic, without obvious abnormality, atraumatic  Eyes:    PERRL, conjunctiva/corneas clear, EOM's intact, fundi    benign, both eyes        Throat:   Lips, mucosa, and tongue normal; teeth and gums normal  Neck:   Supple, symmetrical, trachea midline, no adenopathy;    thyroid:  no enlargement/tenderness/nodules; no carotid   bruit or JVD  Back:     Symmetric, no curvature, ROM normal, no  CVA tenderness  Lungs:     Clear to auscultation bilaterally, respirations unlabored  Chest Wall:    No tenderness or deformity   Heart:    Regular rate and rhythm, S1 and S2 normal, no murmur, rub   or gallop     Abdomen:     Soft, non-tender, bowel sounds active all four quadrants,    no masses, no organomegaly        Extremities:   Extremities normal, atraumatic, no cyanosis or edema  Pulses:   2+ and symmetric all extremities  Skin:   Skin color, texture, turgor normal, no rashes or lesions  Lymph nodes:   Cervical, supraclavicular, and axillary nodes normal  Neurologic:   CNII-XII intact, normal strength, sensation and reflexes    throughout    LABORATORY DATA:  No results found for this or any previous visit (from the past 48 hour(s)).    RADIOGRAPHY: No results found.     PATHOLOGY: None  ASSESSMENT/PLAN: Ms. Djuric is a very pleasant 68 yo caucasian female with recent diagnosis of bilateral saddle pulmonary emboli with right heart strain as well as a right brachial artery thrombus.  We did an extensive lab work up and results are pending. We will see what this shows. If she is positive for anticardiolipin antibody syndrome, we will have her stay on Coumadin but if she is negative we will look at transitioning her to Xarelto or Eliquis.  We will plan to repeat CT angio and [place] of both lower extremities in 5-6 weeks with follow-up that same day.   All questions were answered and she is in agreement with the plab. She will contact our office with any questions or concerns. We can certainly see her sooner if needed.   She was discussed with and also seen by Dr. Marin Olp and he is in agreement with the aforementioned.   [name], NP   Addendum: I saw and examined Ms. Jimmye Norman with [facility].  This  is relatively complicated.  She is morbidly obese.  So far, are hypercoagulable studies really have not shown is anything.  She does not have a circulating lupus anticoagulant.   Her cardiolipin antibodies are negative.  I do not see any reason why she cannot be on Eliquis.  Even though she is morbidly obese, I am pretty sure studies have been done in patients who are somewhat large that have shown the safety of the new oral anticoagulants.  We will see what the rest of her hypercoagulable studies show.  I would think though that we probably would not going to find anything that is significant.  I do think that with another CT angiogram should be done so that we can see if the pulmonary emboli are resolving.  I do not see any evidence of any underlying malignancy.  She does not smoke.  She does not drink.  There is no estrogens that she is taking.  We spent about 45 minutes with her.  Again, this was pretty complicated.  She has an IVC filter in place.  I am not sure why the IVC filter was placed.  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  I am sure this is why she had the thrombus in the right brachial artery.  I suspect she probably is going to need lifelong anticoagulation given the extent of her thromboembolic disease.  We will plan to see her back when she has her CT angiogram done.  Also would like to get Dopplers of her lower legs.  Lattie Haw, MD

## 2019-05-20 DIAGNOSIS — Z48812 Encounter for surgical aftercare following surgery on the circulatory system: Secondary | ICD-10-CM | POA: Diagnosis not present

## 2019-05-20 DIAGNOSIS — K5792 Diverticulitis of intestine, part unspecified, without perforation or abscess without bleeding: Secondary | ICD-10-CM | POA: Diagnosis not present

## 2019-05-20 DIAGNOSIS — I724 Aneurysm of artery of lower extremity: Secondary | ICD-10-CM | POA: Diagnosis not present

## 2019-05-20 DIAGNOSIS — I2602 Saddle embolus of pulmonary artery with acute cor pulmonale: Secondary | ICD-10-CM | POA: Diagnosis not present

## 2019-05-20 DIAGNOSIS — I700 Atherosclerosis of aorta: Secondary | ICD-10-CM | POA: Diagnosis not present

## 2019-05-20 DIAGNOSIS — E041 Nontoxic single thyroid nodule: Secondary | ICD-10-CM | POA: Diagnosis not present

## 2019-05-20 LAB — BETA-2-GLYCOPROTEIN I ABS, IGG/M/A
Beta-2 Glyco I IgG: <9 GPI IgG units (ref 0–20)
Beta-2-Glycoprotein I IgA: <9 GPI IgA units (ref 0–25)
Beta-2-Glycoprotein I IgM: <9 GPI IgM units (ref 0–32)

## 2019-05-20 LAB — PROTEIN S ACTIVITY: Protein S Activity: 40 % — ABNORMAL LOW (ref 63–140)

## 2019-05-20 LAB — CARDIOLIPIN ANTIBODIES, IGG, IGM, IGA
Anticardiolipin IgA: <9 APL U/mL (ref 0–11)
Anticardiolipin IgG: <9 GPL U/mL (ref 0–14)
Anticardiolipin IgM: 12 MPL U/mL (ref 0–12)

## 2019-05-20 LAB — PROTEIN S, TOTAL: Protein S Ag, Total: 63 % (ref 60–150)

## 2019-05-20 LAB — PROTEIN C ACTIVITY: Protein C Activity: 65 % — ABNORMAL LOW (ref 73–180)

## 2019-05-20 LAB — HOMOCYSTEINE: Homocysteine: 15.6 umol/L (ref 0.0–17.2)

## 2019-05-21 ENCOUNTER — Ambulatory Visit (INDEPENDENT_AMBULATORY_CARE_PROVIDER_SITE_OTHER): Payer: Medicare Other | Admitting: General Practice

## 2019-05-21 ENCOUNTER — Encounter: Payer: Self-pay | Admitting: Family

## 2019-05-21 DIAGNOSIS — I2602 Saddle embolus of pulmonary artery with acute cor pulmonale: Secondary | ICD-10-CM | POA: Diagnosis not present

## 2019-05-21 DIAGNOSIS — E041 Nontoxic single thyroid nodule: Secondary | ICD-10-CM | POA: Diagnosis not present

## 2019-05-21 DIAGNOSIS — Z7901 Long term (current) use of anticoagulants: Secondary | ICD-10-CM | POA: Diagnosis not present

## 2019-05-21 DIAGNOSIS — I724 Aneurysm of artery of lower extremity: Secondary | ICD-10-CM | POA: Diagnosis not present

## 2019-05-21 DIAGNOSIS — I700 Atherosclerosis of aorta: Secondary | ICD-10-CM | POA: Diagnosis not present

## 2019-05-21 DIAGNOSIS — I749 Embolism and thrombosis of unspecified artery: Secondary | ICD-10-CM

## 2019-05-21 DIAGNOSIS — Z48812 Encounter for surgical aftercare following surgery on the circulatory system: Secondary | ICD-10-CM | POA: Diagnosis not present

## 2019-05-21 DIAGNOSIS — K5792 Diverticulitis of intestine, part unspecified, without perforation or abscess without bleeding: Secondary | ICD-10-CM | POA: Diagnosis not present

## 2019-05-21 LAB — PROTEIN C, TOTAL: Protein C, Total: 56 % — ABNORMAL LOW (ref 60–150)

## 2019-05-21 LAB — LUPUS ANTICOAGULANT PANEL
DRVVT: 51.7 s — ABNORMAL HIGH (ref 0.0–47.0)
PTT Lupus Anticoagulant: 55.5 s — ABNORMAL HIGH (ref 0.0–51.9)

## 2019-05-21 LAB — PTT-LA MIX: PTT-LA Mix: 44.5 s (ref 0.0–48.9)

## 2019-05-21 LAB — PTT-LA INCUB MIX: PTT-LA Incub Mix: 45.6 s (ref 0.0–48.9)

## 2019-05-21 LAB — DRVVT MIX: dRVVT Mix: 38.8 s (ref 0.0–47.0)

## 2019-05-21 LAB — POCT INR: INR: 2.3 (ref 2.0–3.0)

## 2019-05-21 NOTE — Patient Instructions (Signed)
Pre visit review using our clinic review tool, if applicable. No additional management support is needed unless otherwise documented below in the visit note.  Please take 7.5 mg daily except 5 mg on Monday and Fridays.  Dosing instructions given to patient and to John Sevier, RN @ [facility] [phone].  Both verbalized understanding. Re-check in 1 week.

## 2019-05-21 NOTE — Addendum Note (Signed)
Addended by: Meriam Sprague D on: 05/21/2019 09:12 AM   Modules accepted: Level of Service

## 2019-05-21 NOTE — Progress Notes (Signed)
I have reviewed and agree with note, evaluation, plan.   Stephen Hunter, MD  

## 2019-05-22 ENCOUNTER — Telehealth: Payer: Self-pay

## 2019-05-22 DIAGNOSIS — I724 Aneurysm of artery of lower extremity: Secondary | ICD-10-CM | POA: Diagnosis not present

## 2019-05-22 DIAGNOSIS — I2602 Saddle embolus of pulmonary artery with acute cor pulmonale: Secondary | ICD-10-CM | POA: Diagnosis not present

## 2019-05-22 DIAGNOSIS — Z6841 Body Mass Index (BMI) 40.0 and over, adult: Secondary | ICD-10-CM | POA: Diagnosis not present

## 2019-05-22 DIAGNOSIS — Z5181 Encounter for therapeutic drug level monitoring: Secondary | ICD-10-CM | POA: Diagnosis not present

## 2019-05-22 DIAGNOSIS — E041 Nontoxic single thyroid nodule: Secondary | ICD-10-CM | POA: Diagnosis not present

## 2019-05-22 DIAGNOSIS — G43909 Migraine, unspecified, not intractable, without status migrainosus: Secondary | ICD-10-CM | POA: Diagnosis not present

## 2019-05-22 DIAGNOSIS — Z7901 Long term (current) use of anticoagulants: Secondary | ICD-10-CM | POA: Diagnosis not present

## 2019-05-22 DIAGNOSIS — Z9181 History of falling: Secondary | ICD-10-CM | POA: Diagnosis not present

## 2019-05-22 DIAGNOSIS — Z48812 Encounter for surgical aftercare following surgery on the circulatory system: Secondary | ICD-10-CM | POA: Diagnosis not present

## 2019-05-22 DIAGNOSIS — K5792 Diverticulitis of intestine, part unspecified, without perforation or abscess without bleeding: Secondary | ICD-10-CM | POA: Diagnosis not present

## 2019-05-22 DIAGNOSIS — I700 Atherosclerosis of aorta: Secondary | ICD-10-CM | POA: Diagnosis not present

## 2019-05-22 LAB — FACTOR 5 LEIDEN

## 2019-05-22 LAB — PROTHROMBIN GENE MUTATION

## 2019-05-22 NOTE — Telephone Encounter (Signed)
Leslie Knapp with [facility] [phone] Requesting PT 2 times a week for 4 weeks  Once weekly for 2 weeks

## 2019-05-22 NOTE — Telephone Encounter (Signed)
Ok to give verbal 

## 2019-05-22 NOTE — Telephone Encounter (Signed)
[facility] aware of verbal OK

## 2019-05-23 ENCOUNTER — Telehealth: Payer: Self-pay | Admitting: Physician Assistant

## 2019-05-23 DIAGNOSIS — I700 Atherosclerosis of aorta: Secondary | ICD-10-CM | POA: Diagnosis not present

## 2019-05-23 DIAGNOSIS — Z6841 Body Mass Index (BMI) 40.0 and over, adult: Secondary | ICD-10-CM | POA: Diagnosis not present

## 2019-05-23 DIAGNOSIS — Z7901 Long term (current) use of anticoagulants: Secondary | ICD-10-CM

## 2019-05-23 DIAGNOSIS — Z48812 Encounter for surgical aftercare following surgery on the circulatory system: Secondary | ICD-10-CM | POA: Diagnosis not present

## 2019-05-23 DIAGNOSIS — Z5181 Encounter for therapeutic drug level monitoring: Secondary | ICD-10-CM | POA: Diagnosis not present

## 2019-05-23 DIAGNOSIS — K5792 Diverticulitis of intestine, part unspecified, without perforation or abscess without bleeding: Secondary | ICD-10-CM | POA: Diagnosis not present

## 2019-05-23 DIAGNOSIS — E041 Nontoxic single thyroid nodule: Secondary | ICD-10-CM | POA: Diagnosis not present

## 2019-05-23 DIAGNOSIS — Z7982 Long term (current) use of aspirin: Secondary | ICD-10-CM

## 2019-05-23 DIAGNOSIS — I2602 Saddle embolus of pulmonary artery with acute cor pulmonale: Secondary | ICD-10-CM | POA: Diagnosis not present

## 2019-05-23 DIAGNOSIS — I724 Aneurysm of artery of lower extremity: Secondary | ICD-10-CM | POA: Diagnosis not present

## 2019-05-23 DIAGNOSIS — G43909 Migraine, unspecified, not intractable, without status migrainosus: Secondary | ICD-10-CM | POA: Diagnosis not present

## 2019-05-23 NOTE — Telephone Encounter (Signed)
fyi

## 2019-05-23 NOTE — Telephone Encounter (Signed)
In your folder for review

## 2019-05-23 NOTE — Telephone Encounter (Signed)
Form completed and given to Patina

## 2019-05-23 NOTE — Telephone Encounter (Signed)
I have placed a home health cert. And plan of care in the bin up front with a charge sheet.  ?

## 2019-05-23 NOTE — Telephone Encounter (Signed)
Thank you :)

## 2019-05-23 NOTE — Telephone Encounter (Signed)
Received and reviewed forms. Placed in basket for supervising MD review and signature. Will fax once completed.

## 2019-05-26 ENCOUNTER — Telehealth: Payer: Self-pay | Admitting: Physician Assistant

## 2019-05-26 DIAGNOSIS — I724 Aneurysm of artery of lower extremity: Secondary | ICD-10-CM | POA: Diagnosis not present

## 2019-05-26 DIAGNOSIS — I700 Atherosclerosis of aorta: Secondary | ICD-10-CM | POA: Diagnosis not present

## 2019-05-26 DIAGNOSIS — K5792 Diverticulitis of intestine, part unspecified, without perforation or abscess without bleeding: Secondary | ICD-10-CM | POA: Diagnosis not present

## 2019-05-26 DIAGNOSIS — I2602 Saddle embolus of pulmonary artery with acute cor pulmonale: Secondary | ICD-10-CM | POA: Diagnosis not present

## 2019-05-26 DIAGNOSIS — E041 Nontoxic single thyroid nodule: Secondary | ICD-10-CM | POA: Diagnosis not present

## 2019-05-26 DIAGNOSIS — Z48812 Encounter for surgical aftercare following surgery on the circulatory system: Secondary | ICD-10-CM | POA: Diagnosis not present

## 2019-05-26 NOTE — Telephone Encounter (Signed)
[place] with [facility] called in asking for verbal orders for an extension for  OT 1 time a wk for 4 wks   Ok to LM on VM if no answer.

## 2019-05-27 ENCOUNTER — Encounter: Payer: Self-pay | Admitting: Family

## 2019-05-27 ENCOUNTER — Other Ambulatory Visit: Payer: Self-pay | Admitting: Family

## 2019-05-27 ENCOUNTER — Telehealth: Payer: Self-pay | Admitting: Family

## 2019-05-27 DIAGNOSIS — K5792 Diverticulitis of intestine, part unspecified, without perforation or abscess without bleeding: Secondary | ICD-10-CM | POA: Diagnosis not present

## 2019-05-27 DIAGNOSIS — Z48812 Encounter for surgical aftercare following surgery on the circulatory system: Secondary | ICD-10-CM | POA: Diagnosis not present

## 2019-05-27 DIAGNOSIS — I2602 Saddle embolus of pulmonary artery with acute cor pulmonale: Secondary | ICD-10-CM | POA: Diagnosis not present

## 2019-05-27 DIAGNOSIS — E041 Nontoxic single thyroid nodule: Secondary | ICD-10-CM | POA: Diagnosis not present

## 2019-05-27 DIAGNOSIS — I700 Atherosclerosis of aorta: Secondary | ICD-10-CM | POA: Diagnosis not present

## 2019-05-27 DIAGNOSIS — I724 Aneurysm of artery of lower extremity: Secondary | ICD-10-CM | POA: Diagnosis not present

## 2019-05-27 DIAGNOSIS — I749 Embolism and thrombosis of unspecified artery: Secondary | ICD-10-CM

## 2019-05-27 NOTE — Telephone Encounter (Signed)
I left a message on her voicemail with call back number to go over lab work and medication changes. Will try again tomorrow if I don't hear back from her today.

## 2019-05-27 NOTE — Telephone Encounter (Signed)
Left detailed message on [place] VM of verbal orders for patient to extend OT for 1 time a week for 4 weeks.

## 2019-05-28 ENCOUNTER — Telehealth: Payer: Self-pay | Admitting: Family

## 2019-05-28 ENCOUNTER — Ambulatory Visit (INDEPENDENT_AMBULATORY_CARE_PROVIDER_SITE_OTHER): Payer: Medicare Other | Admitting: General Practice

## 2019-05-28 DIAGNOSIS — I700 Atherosclerosis of aorta: Secondary | ICD-10-CM | POA: Diagnosis not present

## 2019-05-28 DIAGNOSIS — I749 Embolism and thrombosis of unspecified artery: Secondary | ICD-10-CM

## 2019-05-28 DIAGNOSIS — E041 Nontoxic single thyroid nodule: Secondary | ICD-10-CM | POA: Diagnosis not present

## 2019-05-28 DIAGNOSIS — Z48812 Encounter for surgical aftercare following surgery on the circulatory system: Secondary | ICD-10-CM | POA: Diagnosis not present

## 2019-05-28 DIAGNOSIS — Z7901 Long term (current) use of anticoagulants: Secondary | ICD-10-CM | POA: Diagnosis not present

## 2019-05-28 DIAGNOSIS — I2602 Saddle embolus of pulmonary artery with acute cor pulmonale: Secondary | ICD-10-CM | POA: Diagnosis not present

## 2019-05-28 DIAGNOSIS — I724 Aneurysm of artery of lower extremity: Secondary | ICD-10-CM | POA: Diagnosis not present

## 2019-05-28 DIAGNOSIS — K5792 Diverticulitis of intestine, part unspecified, without perforation or abscess without bleeding: Secondary | ICD-10-CM | POA: Diagnosis not present

## 2019-05-28 LAB — POCT INR: INR: 2.5 (ref 2.0–3.0)

## 2019-05-28 NOTE — Telephone Encounter (Signed)
I left another message this time on the patient's home phone voicemail. No answer on Cell phone. Gave call back number to go over recent labs and medication change.

## 2019-05-28 NOTE — Progress Notes (Signed)
I have reviewed and agree with note, evaluation, plan.   Stephen Hunter, MD  

## 2019-05-28 NOTE — Patient Instructions (Signed)
Pre visit review using our clinic review tool, if applicable. No additional management support is needed unless otherwise documented below in the visit note.  Please continue to take 7.5 mg daily except 5 mg on Monday and Fridays.  Dosing instructions given to patient and to Perrin Smack, RN @ [facility] [phone] while in patient's home.  Re-check in 2 weeks.

## 2019-05-29 ENCOUNTER — Other Ambulatory Visit: Payer: Self-pay | Admitting: Family

## 2019-05-29 ENCOUNTER — Telehealth: Payer: Self-pay | Admitting: Family

## 2019-05-29 DIAGNOSIS — I2602 Saddle embolus of pulmonary artery with acute cor pulmonale: Secondary | ICD-10-CM

## 2019-05-29 MED ORDER — WARFARIN SODIUM 5 MG PO TABS: ORAL_TABLET | ORAL | 6 refills | Status: DC

## 2019-05-29 NOTE — Telephone Encounter (Signed)
I was able to speak with the patient about her protein C and S deficiencies. Due to the cost of Eliquis she prefers to stay on coumadin. She would like a monitor to check her level at home. She goes to the coumadin clinic in [place]. We will see what we can do to facilitate her getting one.  All questions at this time answered. She will let us know if she has any questions or concerns.

## 2019-06-06 ENCOUNTER — Other Ambulatory Visit (HOSPITAL_BASED_OUTPATIENT_CLINIC_OR_DEPARTMENT_OTHER): Payer: Self-pay | Admitting: Physician Assistant

## 2019-06-06 DIAGNOSIS — Z1231 Encounter for screening mammogram for malignant neoplasm of breast: Secondary | ICD-10-CM

## 2019-06-06 DIAGNOSIS — Z20828 Contact with and (suspected) exposure to other viral communicable diseases: Secondary | ICD-10-CM | POA: Diagnosis not present

## 2019-06-06 DIAGNOSIS — Z1159 Encounter for screening for other viral diseases: Secondary | ICD-10-CM | POA: Diagnosis not present

## 2019-06-10 ENCOUNTER — Telehealth: Payer: Self-pay

## 2019-06-10 DIAGNOSIS — I2692 Saddle embolus of pulmonary artery without acute cor pulmonale: Secondary | ICD-10-CM

## 2019-06-10 HISTORY — DX: Saddle embolus of pulmonary artery without acute cor pulmonale: I26.92

## 2019-06-10 NOTE — Telephone Encounter (Signed)
Hi Sarah!  I am the coumadin nurse for Leslie Knapp primary care.  I spoke with patient today about signing her up for home INR monitoring.  I will follow her results and dose her per protocol.  Let me know if you need anything from me!  Thanks for all you do.  Villa Herb, RN

## 2019-06-10 NOTE — Telephone Encounter (Signed)
Routing to [employer], CC

## 2019-06-11 ENCOUNTER — Ambulatory Visit (INDEPENDENT_AMBULATORY_CARE_PROVIDER_SITE_OTHER): Payer: Medicare Other | Admitting: General Practice

## 2019-06-11 DIAGNOSIS — I700 Atherosclerosis of aorta: Secondary | ICD-10-CM | POA: Diagnosis not present

## 2019-06-11 DIAGNOSIS — Z7901 Long term (current) use of anticoagulants: Secondary | ICD-10-CM | POA: Diagnosis not present

## 2019-06-11 DIAGNOSIS — Z48812 Encounter for surgical aftercare following surgery on the circulatory system: Secondary | ICD-10-CM | POA: Diagnosis not present

## 2019-06-11 DIAGNOSIS — I749 Embolism and thrombosis of unspecified artery: Secondary | ICD-10-CM

## 2019-06-11 DIAGNOSIS — I724 Aneurysm of artery of lower extremity: Secondary | ICD-10-CM | POA: Diagnosis not present

## 2019-06-11 DIAGNOSIS — I2602 Saddle embolus of pulmonary artery with acute cor pulmonale: Secondary | ICD-10-CM

## 2019-06-11 DIAGNOSIS — K5792 Diverticulitis of intestine, part unspecified, without perforation or abscess without bleeding: Secondary | ICD-10-CM | POA: Diagnosis not present

## 2019-06-11 DIAGNOSIS — E041 Nontoxic single thyroid nodule: Secondary | ICD-10-CM | POA: Diagnosis not present

## 2019-06-11 LAB — POCT INR: INR: 2.1 (ref 2.0–3.0)

## 2019-06-11 NOTE — Patient Instructions (Signed)
Pre visit review using our clinic review tool, if applicable. No additional management support is needed unless otherwise documented below in the visit note.  Please continue to take 7.5 mg daily except 5 mg on Monday and Fridays.  Dosing instructions given to patient and to Perrin Smack, RN @ [facility] [phone] while in patient's home.  Re-check in 1 week at [facility].

## 2019-06-12 ENCOUNTER — Other Ambulatory Visit: Payer: Self-pay

## 2019-06-12 ENCOUNTER — Ambulatory Visit (HOSPITAL_BASED_OUTPATIENT_CLINIC_OR_DEPARTMENT_OTHER)
Admission: RE | Admit: 2019-06-12 | Discharge: 2019-06-12 | Disposition: A | Discharge status: Home / Self Care | Payer: Medicare Other | Source: Ambulatory Visit | Attending: Physician Assistant | Admitting: Physician Assistant

## 2019-06-12 DIAGNOSIS — Z1231 Encounter for screening mammogram for malignant neoplasm of breast: Secondary | ICD-10-CM | POA: Insufficient documentation

## 2019-06-13 DIAGNOSIS — I2602 Saddle embolus of pulmonary artery with acute cor pulmonale: Secondary | ICD-10-CM | POA: Diagnosis not present

## 2019-06-13 DIAGNOSIS — Z48812 Encounter for surgical aftercare following surgery on the circulatory system: Secondary | ICD-10-CM | POA: Diagnosis not present

## 2019-06-13 DIAGNOSIS — I724 Aneurysm of artery of lower extremity: Secondary | ICD-10-CM | POA: Diagnosis not present

## 2019-06-13 DIAGNOSIS — K5792 Diverticulitis of intestine, part unspecified, without perforation or abscess without bleeding: Secondary | ICD-10-CM | POA: Diagnosis not present

## 2019-06-13 DIAGNOSIS — I700 Atherosclerosis of aorta: Secondary | ICD-10-CM | POA: Diagnosis not present

## 2019-06-13 DIAGNOSIS — E041 Nontoxic single thyroid nodule: Secondary | ICD-10-CM | POA: Diagnosis not present

## 2019-06-17 DIAGNOSIS — Z95828 Presence of other vascular implants and grafts: Secondary | ICD-10-CM | POA: Diagnosis not present

## 2019-06-18 ENCOUNTER — Ambulatory Visit: Payer: Medicare Other

## 2019-06-18 ENCOUNTER — Ambulatory Visit (INDEPENDENT_AMBULATORY_CARE_PROVIDER_SITE_OTHER): Payer: Medicare Other | Admitting: General Practice

## 2019-06-18 DIAGNOSIS — I724 Aneurysm of artery of lower extremity: Secondary | ICD-10-CM | POA: Diagnosis not present

## 2019-06-18 DIAGNOSIS — E041 Nontoxic single thyroid nodule: Secondary | ICD-10-CM | POA: Diagnosis not present

## 2019-06-18 DIAGNOSIS — I700 Atherosclerosis of aorta: Secondary | ICD-10-CM | POA: Diagnosis not present

## 2019-06-18 DIAGNOSIS — Z48812 Encounter for surgical aftercare following surgery on the circulatory system: Secondary | ICD-10-CM | POA: Diagnosis not present

## 2019-06-18 DIAGNOSIS — K5792 Diverticulitis of intestine, part unspecified, without perforation or abscess without bleeding: Secondary | ICD-10-CM | POA: Diagnosis not present

## 2019-06-18 DIAGNOSIS — Z7901 Long term (current) use of anticoagulants: Secondary | ICD-10-CM | POA: Diagnosis not present

## 2019-06-18 DIAGNOSIS — I2602 Saddle embolus of pulmonary artery with acute cor pulmonale: Secondary | ICD-10-CM | POA: Diagnosis not present

## 2019-06-18 LAB — POCT INR: INR: 2.3 (ref 2.0–3.0)

## 2019-06-18 NOTE — Progress Notes (Signed)
I have reviewed and agree with note, evaluation, plan.   Stephen Hunter, MD  

## 2019-06-18 NOTE — Patient Instructions (Addendum)
Please continue to take 7.5 mg daily except 5 mg on Monday and Fridays.  Dosing instructions given to patient and to Perrin Smack, @ [facility] while in patient's home.  Paper work has been sent to [employer] for home monitoring.

## 2019-06-21 DIAGNOSIS — Z7901 Long term (current) use of anticoagulants: Secondary | ICD-10-CM | POA: Diagnosis not present

## 2019-06-21 DIAGNOSIS — Z48812 Encounter for surgical aftercare following surgery on the circulatory system: Secondary | ICD-10-CM | POA: Diagnosis not present

## 2019-06-21 DIAGNOSIS — E041 Nontoxic single thyroid nodule: Secondary | ICD-10-CM | POA: Diagnosis not present

## 2019-06-21 DIAGNOSIS — I700 Atherosclerosis of aorta: Secondary | ICD-10-CM | POA: Diagnosis not present

## 2019-06-21 DIAGNOSIS — Z6841 Body Mass Index (BMI) 40.0 and over, adult: Secondary | ICD-10-CM | POA: Diagnosis not present

## 2019-06-21 DIAGNOSIS — K5792 Diverticulitis of intestine, part unspecified, without perforation or abscess without bleeding: Secondary | ICD-10-CM | POA: Diagnosis not present

## 2019-06-21 DIAGNOSIS — Z5181 Encounter for therapeutic drug level monitoring: Secondary | ICD-10-CM | POA: Diagnosis not present

## 2019-06-21 DIAGNOSIS — Z9181 History of falling: Secondary | ICD-10-CM | POA: Diagnosis not present

## 2019-06-21 DIAGNOSIS — I2602 Saddle embolus of pulmonary artery with acute cor pulmonale: Secondary | ICD-10-CM | POA: Diagnosis not present

## 2019-06-21 DIAGNOSIS — I724 Aneurysm of artery of lower extremity: Secondary | ICD-10-CM | POA: Diagnosis not present

## 2019-06-21 DIAGNOSIS — G43909 Migraine, unspecified, not intractable, without status migrainosus: Secondary | ICD-10-CM | POA: Diagnosis not present

## 2019-06-23 ENCOUNTER — Inpatient Hospital Stay (HOSPITAL_BASED_OUTPATIENT_CLINIC_OR_DEPARTMENT_OTHER): Payer: Medicare Other | Admitting: Family

## 2019-06-23 ENCOUNTER — Encounter: Payer: Self-pay | Admitting: Family

## 2019-06-23 ENCOUNTER — Ambulatory Visit (HOSPITAL_BASED_OUTPATIENT_CLINIC_OR_DEPARTMENT_OTHER)
Admission: RE | Admit: 2019-06-23 | Discharge: 2019-06-23 | Disposition: A | Discharge status: Home / Self Care | Payer: Medicare Other | Source: Ambulatory Visit | Attending: Family | Admitting: Family

## 2019-06-23 ENCOUNTER — Encounter: Payer: Self-pay | Admitting: Physician Assistant

## 2019-06-23 ENCOUNTER — Inpatient Hospital Stay: Payer: Medicare Other | Attending: Family

## 2019-06-23 ENCOUNTER — Other Ambulatory Visit: Payer: Self-pay

## 2019-06-23 VITALS — BP 105/61 | HR 85 | Temp 96.6°F | Resp 19 | Ht 70.0 in | Wt 322.0 lb

## 2019-06-23 DIAGNOSIS — I2602 Saddle embolus of pulmonary artery with acute cor pulmonale: Secondary | ICD-10-CM | POA: Diagnosis not present

## 2019-06-23 DIAGNOSIS — Z7901 Long term (current) use of anticoagulants: Secondary | ICD-10-CM | POA: Diagnosis not present

## 2019-06-23 DIAGNOSIS — M7989 Other specified soft tissue disorders: Secondary | ICD-10-CM | POA: Insufficient documentation

## 2019-06-23 DIAGNOSIS — Z862 Personal history of diseases of the blood and blood-forming organs and certain disorders involving the immune mechanism: Secondary | ICD-10-CM | POA: Diagnosis present

## 2019-06-23 DIAGNOSIS — I519 Heart disease, unspecified: Secondary | ICD-10-CM | POA: Insufficient documentation

## 2019-06-23 DIAGNOSIS — Z79899 Other long term (current) drug therapy: Secondary | ICD-10-CM | POA: Diagnosis not present

## 2019-06-23 DIAGNOSIS — I749 Embolism and thrombosis of unspecified artery: Secondary | ICD-10-CM | POA: Diagnosis not present

## 2019-06-23 DIAGNOSIS — R0602 Shortness of breath: Secondary | ICD-10-CM | POA: Diagnosis not present

## 2019-06-23 DIAGNOSIS — I742 Embolism and thrombosis of arteries of the upper extremities: Secondary | ICD-10-CM | POA: Insufficient documentation

## 2019-06-23 DIAGNOSIS — I2692 Saddle embolus of pulmonary artery without acute cor pulmonale: Secondary | ICD-10-CM | POA: Diagnosis present

## 2019-06-23 DIAGNOSIS — D6859 Other primary thrombophilia: Secondary | ICD-10-CM | POA: Insufficient documentation

## 2019-06-23 LAB — CMP (CANCER CENTER ONLY)
ALT: 11 U/L (ref 0–44)
AST: 19 U/L (ref 15–41)
Albumin: 4.3 g/dL (ref 3.5–5.0)
Alkaline Phosphatase: 97 U/L (ref 38–126)
Anion gap: 9 (ref 5–15)
BUN: 11 mg/dL (ref 8–23)
CO2: 25 mmol/L (ref 22–32)
Calcium: 9.7 mg/dL (ref 8.9–10.3)
Chloride: 105 mmol/L (ref 98–111)
Creatinine: 0.66 mg/dL (ref 0.44–1.00)
GFR, Est AFR Am: >60 mL/min (ref >60)
GFR, Estimated: >60 mL/min (ref >60)
Glucose, Bld: 116 mg/dL — ABNORMAL HIGH (ref 70–99)
Potassium: 4.3 mmol/L (ref 3.5–5.1)
Sodium: 139 mmol/L (ref 135–145)
Total Bilirubin: 0.4 mg/dL (ref 0.3–1.2)
Total Protein: 7 g/dL (ref 6.5–8.1)

## 2019-06-23 LAB — CBC WITH DIFFERENTIAL (CANCER CENTER ONLY)
Abs Immature Granulocytes: 0.01 10*3/uL (ref 0.00–0.07)
Basophils Absolute: 0 10*3/uL (ref 0.0–0.1)
Basophils Relative: 1 %
Eosinophils Absolute: 0.2 10*3/uL (ref 0.0–0.5)
Eosinophils Relative: 4 %
HCT: 48.2 % — ABNORMAL HIGH (ref 36.0–46.0)
Hemoglobin: 15.2 g/dL — ABNORMAL HIGH (ref 12.0–15.0)
Immature Granulocytes: 0 %
Lymphocytes Relative: 33 %
Lymphs Abs: 2.1 10*3/uL (ref 0.7–4.0)
MCH: 28.1 pg (ref 26.0–34.0)
MCHC: 31.5 g/dL (ref 30.0–36.0)
MCV: 89.3 fL (ref 80.0–100.0)
Monocytes Absolute: 0.3 10*3/uL (ref 0.1–1.0)
Monocytes Relative: 5 %
Neutro Abs: 3.7 10*3/uL (ref 1.7–7.7)
Neutrophils Relative %: 57 %
Platelet Count: 230 10*3/uL (ref 150–400)
RBC: 5.4 MIL/uL — ABNORMAL HIGH (ref 3.87–5.11)
RDW: 13.6 % (ref 11.5–15.5)
WBC Count: 6.4 10*3/uL (ref 4.0–10.5)
nRBC: 0 % (ref 0.0–0.2)

## 2019-06-23 MED ORDER — APIXABAN 5 MG PO TABS: 5.0000 mg | ORAL_TABLET | Freq: Two times a day (BID) | ORAL | 4 refills | Status: DC

## 2019-06-23 MED ORDER — IOHEXOL 350 MG/ML SOLN
100.0000 mL | Freq: Once | INTRAVENOUS | Status: AC | PRN
  Administered 2019-06-23: 09:00:00 100 mL via INTRAVENOUS

## 2019-06-23 NOTE — Progress Notes (Signed)
Hematology and Oncology Follow Up Visit  Leslie Knapp [id number] 03-06-1951 68 y.o. 06/23/2019   Principle Diagnosis:   Saddle pulmonary emboli with right heart strain and right brachial artery thrombus  Protein C deficiency  Protein S deficiency  Current Therapy:   Coumadin    Interim History:  Leslie Knapp is here today for follow-up. She has had a nice response to Coumadin. CT angio today showed resolution of PE and US showed no evidence of DVT in either leg.  She still has a hematoma in the left groin and right upper arm that she feels is improving.  She is doing well on Coumadin and INR last week was therapeutic at 2.3. However, she is wanting to change to Eliquis so she can eat leafy greens and not have to have frequent lab work. We are happy to help her with this change. She still has some SOB with over exertion and will take a break to rest when needed.  She is still doing the light therapy to her left leg to help with breaking down the hematoma. She feels that this is helping.  She is also going to start water aerobics soon.  She has chronic swelling in both legs that waxes and wanes. She plans to follow up with her vascular specialist for further eval.  She has had several episodes where she felt that her heart "skipped beats". She will be following up with Dr. Kari Baars who had seen her during her hospital admission.  No fever, chills, n/v, cough, rash, dizziness, chest pain, abdominal pain or changes in bowel or bladder habits.  No numbness or tingling in her extremities.  She uses a rolling walker with seat when ambulating for added support.  No falls or syncopal episodes to report.  She has maintained a good appetite and is staying well hydrated. Her weight is stable.   ECOG Performance Status: 1 - Symptomatic but completely ambulatory  Medications:  Allergies as of 06/23/2019      Reactions   Lactose Intolerance (gi)    Celebrex [celecoxib] Swelling, Rash        Medication List       Accurate as of June 23, 2019 11:43 AM. If you have any questions, ask your nurse or doctor.        aspirin EC 81 MG tablet Take 1 tablet (81 mg total) by mouth daily.   aspirin-acetaminophen-caffeine 250-250-65 MG tablet Commonly known as: EXCEDRIN MIGRAINE Take 2 tablets by mouth every 6 (six) hours as needed for headache.   clotrimazole-betamethasone cream Commonly known as: Lotrisone Apply 1 application topically 2 (two) times daily. For up to 2 weeks.   Enzyme Digest Caps Take by mouth.   GENTLE LAXATIVE PO Take by mouth.   MINERALS PO Take 1 oz by mouth daily.   NON FORMULARY ROLFING: Deep Tissue Massage, 2 Txs weekly as needed for joint pain   PROBIOTIC-10 PO Take 1 tablet by mouth daily.   Vitamin D-3 25 MCG (1000 UT) Caps Take 1 each by mouth daily.   warfarin 5 MG tablet Commonly known as: COUMADIN Take as directed by the anticoagulation clinic. If you are unsure how to take this medication, talk to your nurse or doctor. Original instructions: Take 1.5 tablet by mouth daily at 6 pm.       Allergies:  Allergies  Allergen Reactions  . Lactose Intolerance (Gi)   . Celebrex [Celecoxib] Swelling and Rash    Past Medical History, Surgical history, Social history,  and Family History were reviewed and updated.  Review of Systems: All other 10 point review of systems is negative.   Physical Exam:  vitals were not taken for this visit.   Wt Readings from Last 3 Encounters:  05/19/19 (!) 322 lb 1.9 oz (146.1 kg)  04/07/19 (!) 314 lb (142.4 kg)  03/08/19 (!) 335 lb (152 kg)    Ocular: Sclerae unicteric, pupils equal, round and reactive to light Ear-nose-throat: Oropharynx clear, dentition fair Lymphatic: No cervical or supraclavicular adenopathy Lungs no rales or rhonchi, good excursion bilaterally Heart regular rate and rhythm, no murmur appreciated Abd soft, nontender, positive bowel sounds, no liver or spleen tip  palpated on exam, no fluid wave  MSK no focal spinal tenderness, no joint edema Neuro: non-focal, well-oriented, appropriate affect Breasts: Deferred   Lab Results  Component Value Date   WBC 6.4 06/23/2019   HGB 15.2 (H) 06/23/2019   HCT 48.2 (H) 06/23/2019   MCV 89.3 06/23/2019   PLT 230 06/23/2019   Lab Results  Component Value Date   FERRITIN 136.2 03/28/2016   Lab Results  Component Value Date   RBC 5.40 (H) 06/23/2019   No results found for: KPAFRELGTCHN, LAMBDASER, KAPLAMBRATIO No results found for: IGGSERUM, IGA, IGMSERUM No results found for: Odetta Pink, SPEI   Chemistry      Component Value Date/Time   NA 137 05/19/2019 1324   NA 141 05/21/2018 0000   K 4.0 05/19/2019 1324   CL 102 05/19/2019 1324   CO2 26 05/19/2019 1324   BUN 10 05/19/2019 1324   BUN 11 05/21/2018 0000   CREATININE 0.72 05/19/2019 1324   GLU 87 05/21/2018 0000      Component Value Date/Time   CALCIUM 9.7 05/19/2019 1324   ALKPHOS 88 05/19/2019 1324   AST 21 05/19/2019 1324   ALT 14 05/19/2019 1324   BILITOT 0.4 05/19/2019 1324       Impression and Plan: Leslie Knapp is a very pleasant 68 yo caucasian female with recent diagnosis of bilateral saddle pulmonary emboli with right heart strain as well as a right brachial artery thrombus. Lab work up did show both a protein C and a protein S deficiency. We have repeated those labs today.  I discontinued her Coumadin script and placed the order for Eliquis 5 mg PO BID. I spoke with Dr. Marin Olp and we will have her hold her coumadin today and tomorrow and she will start Eliquis on Wednesday morning. We did go over side effects and she will be on the lookout for any blood loss, excessive bruising or petechiae.   She will stop the daily aspirin and avoid NSAIDs. All instruction were also written out for patient to take home with her.  We will plan to see her back in another 6 weeks.  She will  contact our office with any questions or concerns. We can certainly see her sooner if needed.   [name], NP 12/14/202011:43 AM

## 2019-06-24 ENCOUNTER — Ambulatory Visit: Payer: Self-pay | Admitting: General Practice

## 2019-06-25 ENCOUNTER — Encounter: Payer: Self-pay | Admitting: Family

## 2019-06-25 DIAGNOSIS — K5792 Diverticulitis of intestine, part unspecified, without perforation or abscess without bleeding: Secondary | ICD-10-CM | POA: Diagnosis not present

## 2019-06-25 DIAGNOSIS — I700 Atherosclerosis of aorta: Secondary | ICD-10-CM | POA: Diagnosis not present

## 2019-06-25 DIAGNOSIS — I2602 Saddle embolus of pulmonary artery with acute cor pulmonale: Secondary | ICD-10-CM | POA: Diagnosis not present

## 2019-06-25 DIAGNOSIS — Z48812 Encounter for surgical aftercare following surgery on the circulatory system: Secondary | ICD-10-CM | POA: Diagnosis not present

## 2019-06-25 DIAGNOSIS — I724 Aneurysm of artery of lower extremity: Secondary | ICD-10-CM | POA: Diagnosis not present

## 2019-06-25 DIAGNOSIS — E041 Nontoxic single thyroid nodule: Secondary | ICD-10-CM | POA: Diagnosis not present

## 2019-06-25 LAB — PROTEIN S ACTIVITY: Protein S Activity: 36 % — ABNORMAL LOW (ref 63–140)

## 2019-06-25 LAB — PROTEIN C ACTIVITY: Protein C Activity: 69 % — ABNORMAL LOW (ref 73–180)

## 2019-06-25 LAB — PROTEIN S, TOTAL: Protein S Ag, Total: 56 % — ABNORMAL LOW (ref 60–150)

## 2019-06-26 ENCOUNTER — Telehealth: Payer: Self-pay

## 2019-06-26 LAB — PROTEIN C, TOTAL: Protein C, Total: 66 % (ref 60–150)

## 2019-06-26 NOTE — Telephone Encounter (Signed)
Leslie Knapp [phone] with [facility] is requesting a verbal approval for PT extension starting next week. Once a week for 2 weeks Twice a week for 1 week  Please advise

## 2019-06-26 NOTE — Telephone Encounter (Signed)
Okay given for verbal order.

## 2019-06-27 NOTE — Telephone Encounter (Signed)
Lorna Few has been given verbal OK

## 2019-06-28 NOTE — Progress Notes (Signed)
I have reviewed and agree with note, evaluation, plan.   Latiffany Harwick, MD  

## 2019-07-07 HISTORY — PX: IVC FILTER INSERTION: CATH118245

## 2019-07-14 ENCOUNTER — Encounter: Payer: Self-pay | Admitting: Family

## 2019-07-15 ENCOUNTER — Telehealth: Payer: Self-pay

## 2019-07-15 DIAGNOSIS — E041 Nontoxic single thyroid nodule: Secondary | ICD-10-CM | POA: Diagnosis not present

## 2019-07-15 DIAGNOSIS — I2602 Saddle embolus of pulmonary artery with acute cor pulmonale: Secondary | ICD-10-CM | POA: Diagnosis not present

## 2019-07-15 DIAGNOSIS — Z48812 Encounter for surgical aftercare following surgery on the circulatory system: Secondary | ICD-10-CM | POA: Diagnosis not present

## 2019-07-15 DIAGNOSIS — I700 Atherosclerosis of aorta: Secondary | ICD-10-CM | POA: Diagnosis not present

## 2019-07-15 DIAGNOSIS — K5792 Diverticulitis of intestine, part unspecified, without perforation or abscess without bleeding: Secondary | ICD-10-CM | POA: Diagnosis not present

## 2019-07-15 DIAGNOSIS — I724 Aneurysm of artery of lower extremity: Secondary | ICD-10-CM | POA: Diagnosis not present

## 2019-07-15 NOTE — Telephone Encounter (Signed)
[facility] called in stating that patient is requesting to be discharged from in home PT and to be referred to aquatic therapy. Please advise

## 2019-07-15 NOTE — Telephone Encounter (Signed)
Ok to discharge per patient preference. Can place new referral to PT for aquatic therapy -- can check with patient to see if she has somewhere specific. Otherwise, I believe [facility] and [facility] do aqua therapy.

## 2019-07-16 ENCOUNTER — Other Ambulatory Visit: Payer: Self-pay

## 2019-07-16 NOTE — Telephone Encounter (Signed)
Pt returned our call, she is going to do some research on aquatic therapy locations and will get back to [place] when she finds the right place for her.

## 2019-07-16 NOTE — Telephone Encounter (Signed)
LMOVM asking if patient has preference on location of aquatic therapy

## 2019-07-18 NOTE — Telephone Encounter (Signed)
Hi Leslie Knapp,  I called the office this afternoon and left a message for you but following up here. I cannot climb stairs so I need to check out the few places I found to see what type of entry they have to their pool. I will need some time to contact the various places. I will respond back to you via MyChart with the location as soon as I can determine it. Thank you! Leslie Knapp   Patient will call back with where she wants referral sent to

## 2019-07-24 DIAGNOSIS — B078 Other viral warts: Secondary | ICD-10-CM | POA: Diagnosis not present

## 2019-07-24 DIAGNOSIS — L905 Scar conditions and fibrosis of skin: Secondary | ICD-10-CM | POA: Diagnosis not present

## 2019-07-24 DIAGNOSIS — L218 Other seborrheic dermatitis: Secondary | ICD-10-CM | POA: Diagnosis not present

## 2019-07-24 DIAGNOSIS — L821 Other seborrheic keratosis: Secondary | ICD-10-CM | POA: Diagnosis not present

## 2019-07-24 DIAGNOSIS — L718 Other rosacea: Secondary | ICD-10-CM | POA: Diagnosis not present

## 2019-07-24 DIAGNOSIS — L578 Other skin changes due to chronic exposure to nonionizing radiation: Secondary | ICD-10-CM | POA: Diagnosis not present

## 2019-07-24 DIAGNOSIS — L308 Other specified dermatitis: Secondary | ICD-10-CM | POA: Diagnosis not present

## 2019-08-01 DIAGNOSIS — T148XXA Other injury of unspecified body region, initial encounter: Secondary | ICD-10-CM | POA: Diagnosis not present

## 2019-08-04 ENCOUNTER — Ambulatory Visit: Payer: Medicare Other | Admitting: Family

## 2019-08-04 ENCOUNTER — Other Ambulatory Visit: Payer: Medicare Other

## 2019-08-04 DIAGNOSIS — S301XXA Contusion of abdominal wall, initial encounter: Secondary | ICD-10-CM | POA: Diagnosis not present

## 2019-08-04 DIAGNOSIS — Z7901 Long term (current) use of anticoagulants: Secondary | ICD-10-CM | POA: Diagnosis not present

## 2019-08-04 DIAGNOSIS — Z4589 Encounter for adjustment and management of other implanted devices: Secondary | ICD-10-CM | POA: Diagnosis not present

## 2019-08-04 DIAGNOSIS — Z95828 Presence of other vascular implants and grafts: Secondary | ICD-10-CM | POA: Diagnosis not present

## 2019-08-04 DIAGNOSIS — Z86711 Personal history of pulmonary embolism: Secondary | ICD-10-CM | POA: Diagnosis not present

## 2019-08-04 DIAGNOSIS — Z452 Encounter for adjustment and management of vascular access device: Secondary | ICD-10-CM | POA: Diagnosis not present

## 2019-08-11 ENCOUNTER — Inpatient Hospital Stay: Payer: Medicare Other | Attending: Family

## 2019-08-11 ENCOUNTER — Other Ambulatory Visit: Payer: Self-pay

## 2019-08-11 ENCOUNTER — Inpatient Hospital Stay (HOSPITAL_BASED_OUTPATIENT_CLINIC_OR_DEPARTMENT_OTHER): Payer: Medicare Other | Admitting: Family

## 2019-08-11 ENCOUNTER — Encounter: Payer: Self-pay | Admitting: Family

## 2019-08-11 VITALS — BP 130/95 | HR 80 | Temp 97.8°F | Resp 19 | Ht 70.0 in | Wt 323.1 lb

## 2019-08-11 DIAGNOSIS — R002 Palpitations: Secondary | ICD-10-CM | POA: Insufficient documentation

## 2019-08-11 DIAGNOSIS — D6859 Other primary thrombophilia: Secondary | ICD-10-CM | POA: Diagnosis not present

## 2019-08-11 DIAGNOSIS — Z886 Allergy status to analgesic agent status: Secondary | ICD-10-CM | POA: Insufficient documentation

## 2019-08-11 DIAGNOSIS — I742 Embolism and thrombosis of arteries of the upper extremities: Secondary | ICD-10-CM | POA: Diagnosis not present

## 2019-08-11 DIAGNOSIS — I2692 Saddle embolus of pulmonary artery without acute cor pulmonale: Secondary | ICD-10-CM | POA: Diagnosis not present

## 2019-08-11 DIAGNOSIS — I2602 Saddle embolus of pulmonary artery with acute cor pulmonale: Secondary | ICD-10-CM | POA: Diagnosis not present

## 2019-08-11 DIAGNOSIS — M7989 Other specified soft tissue disorders: Secondary | ICD-10-CM | POA: Diagnosis not present

## 2019-08-11 DIAGNOSIS — Z7901 Long term (current) use of anticoagulants: Secondary | ICD-10-CM | POA: Diagnosis not present

## 2019-08-11 DIAGNOSIS — I749 Embolism and thrombosis of unspecified artery: Secondary | ICD-10-CM

## 2019-08-11 LAB — CMP (CANCER CENTER ONLY)
ALT: 11 U/L (ref 0–44)
AST: 17 U/L (ref 15–41)
Albumin: 4.1 g/dL (ref 3.5–5.0)
Alkaline Phosphatase: 93 U/L (ref 38–126)
Anion gap: 5 (ref 5–15)
BUN: 9 mg/dL (ref 8–23)
CO2: 32 mmol/L (ref 22–32)
Calcium: 9.9 mg/dL (ref 8.9–10.3)
Chloride: 105 mmol/L (ref 98–111)
Creatinine: 0.73 mg/dL (ref 0.44–1.00)
GFR, Est AFR Am: >60 mL/min (ref >60)
GFR, Estimated: >60 mL/min (ref >60)
Glucose, Bld: 113 mg/dL — ABNORMAL HIGH (ref 70–99)
Potassium: 5 mmol/L (ref 3.5–5.1)
Sodium: 142 mmol/L (ref 135–145)
Total Bilirubin: 0.5 mg/dL (ref 0.3–1.2)
Total Protein: 7 g/dL (ref 6.5–8.1)

## 2019-08-11 LAB — CBC WITH DIFFERENTIAL (CANCER CENTER ONLY)
Abs Immature Granulocytes: 0.02 10*3/uL (ref 0.00–0.07)
Basophils Absolute: 0 10*3/uL (ref 0.0–0.1)
Basophils Relative: 0 %
Eosinophils Absolute: 0.1 10*3/uL (ref 0.0–0.5)
Eosinophils Relative: 2 %
HCT: 47.7 % — ABNORMAL HIGH (ref 36.0–46.0)
Hemoglobin: 15.5 g/dL — ABNORMAL HIGH (ref 12.0–15.0)
Immature Granulocytes: 0 %
Lymphocytes Relative: 27 %
Lymphs Abs: 1.7 10*3/uL (ref 0.7–4.0)
MCH: 28 pg (ref 26.0–34.0)
MCHC: 32.5 g/dL (ref 30.0–36.0)
MCV: 86.1 fL (ref 80.0–100.0)
Monocytes Absolute: 0.4 10*3/uL (ref 0.1–1.0)
Monocytes Relative: 6 %
Neutro Abs: 4.1 10*3/uL (ref 1.7–7.7)
Neutrophils Relative %: 65 %
Platelet Count: 219 10*3/uL (ref 150–400)
RBC: 5.54 MIL/uL — ABNORMAL HIGH (ref 3.87–5.11)
RDW: 13.9 % (ref 11.5–15.5)
WBC Count: 6.3 10*3/uL (ref 4.0–10.5)
nRBC: 0 % (ref 0.0–0.2)

## 2019-08-11 LAB — D-DIMER, QUANTITATIVE: D-Dimer, Quant: 0.61 ug/mL-FEU — ABNORMAL HIGH (ref 0.00–0.50)

## 2019-08-11 LAB — LACTATE DEHYDROGENASE: LDH: 206 U/L — ABNORMAL HIGH (ref 98–192)

## 2019-08-11 MED ORDER — APIXABAN 5 MG PO TABS: 5.0000 mg | ORAL_TABLET | Freq: Two times a day (BID) | ORAL | 4 refills | Status: DC

## 2019-08-11 NOTE — Progress Notes (Signed)
Hematology and Oncology Follow Up Visit  Leslie Knapp [id number] Sep 03, 1950 69 y.o. 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 She is doing well and states that she IVC filter removed last week and did well.  She is still having palpitations off and on and would like to have a cardiologist in [facility]. I gave her the contact information for [facility] here at [facility]. She will give them a call.  The hematoma in her left groin continues to improve. She still has a lemon sized hematoma present on exam. She recently saw her Vascular surgeon and will follow-up again 6 months.  She wears compression stockings on both lower extremities which does help with swelling. She notes that swelling increases when she is sitting. Pedal pulses are 2+.  No numbness or tingling in her extremities.  She is doing well on Eliquis and has had no issues with bleeding. No abnormal bruising, no petechiae.  No fever, chills, n/v, cough, rash, dizziness, SOB, chest pain, abdominal pain or changes in bowel or bladder habits.  She has maintained a good appetite and is staying well hydrated. She plans to start intermittent fasting with her sister to lose weight.   ECOG Performance Status: 1 - Symptomatic but completely ambulatory  Medications:  Allergies as of 08/11/2019      Reactions   Celebrex [celecoxib] Swelling, Rash   Lactose Intolerance (gi)       Medication List       Accurate as of August 11, 2019  8:51 AM. If you have any questions, ask your nurse or doctor.        apixaban 5 MG Tabs tablet Commonly known as: Eliquis Take 1 tablet (5 mg total) by mouth 2 (two) times daily.   Enzyme Digest Caps Take by mouth.   GENTLE LAXATIVE PO Take by mouth.   MINERALS  PO Take 1 oz by mouth daily.   NON FORMULARY ROLFING: Deep Tissue Massage, 2 Txs weekly as needed for joint pain   PROBIOTIC-10 PO Take 1 tablet by mouth daily.   Vitamin D-3 25 MCG (1000 UT) Caps Take 1 each by mouth daily.       Allergies:  Allergies  Allergen Reactions  . Celebrex [Celecoxib] Swelling and Rash  . Lactose Intolerance (Gi)     Past Medical History, Surgical history, Social history, and Family History were reviewed and updated.  Review of Systems: All other 10 point review of systems is negative.   Physical Exam:  vitals were not taken for this visit.   Wt Readings from Last 3 Encounters:  06/23/19 (!) 322 lb (146.1 kg)  05/19/19 (!) 322 lb 1.9 oz (146.1 kg)  04/07/19 (!) 314 lb (142.4 kg)    Ocular: Sclerae unicteric, pupils equal, round and reactive to light Ear-nose-throat: Oropharynx clear, dentition fair Lymphatic: No cervical or supraclavicular adenopathy Lungs no rales or rhonchi, good excursion bilaterally Heart regular rate and rhythm, no murmur appreciated Abd soft, nontender, positive bowel sounds, no liver or spleen tip palpated on exam, no fluid wave  MSK no focal spinal tenderness, no joint edema Neuro: non-focal, well-oriented, appropriate affect Breasts: Deferred   Lab Results  Component Value Date   WBC 6.3 08/11/2019   HGB 15.5 (H) 08/11/2019   HCT  47.7 (H) 08/11/2019   MCV 86.1 08/11/2019   PLT 219 08/11/2019   Lab Results  Component Value Date   FERRITIN 136.2 03/28/2016   Lab Results  Component Value Date   RBC 5.54 (H) 08/11/2019   No results found for: KPAFRELGTCHN, LAMBDASER, KAPLAMBRATIO No results found for: IGGSERUM, IGA, IGMSERUM No results found for: Odetta Pink, SPEI   Chemistry      Component Value Date/Time   NA 139 06/23/2019 1123   NA 141 05/21/2018 0000   K 4.3 06/23/2019 1123   CL 105 06/23/2019 1123   CO2 25 06/23/2019 1123   BUN 11  06/23/2019 1123   BUN 11 05/21/2018 0000   CREATININE 0.66 06/23/2019 1123   GLU 87 05/21/2018 0000      Component Value Date/Time   CALCIUM 9.7 06/23/2019 1123   ALKPHOS 97 06/23/2019 1123   AST 19 06/23/2019 1123   ALT 11 06/23/2019 1123   BILITOT 0.4 06/23/2019 1123       Impression and Plan: Leslie Knapp is a very pleasant 69 yo caucasian femalewith recent diagnosisof bilateral saddle pulmonary emboli with right heart strain as well as a right brachial artery thrombus. Lab work up did show both a protein C and a protein S deficiency. We will see her back in another 4 months.  Eliquis script refilled.  She will contact our office with any questions or concerns. We can certainly see her sooner if needed.   [name], NP 2/1/20218:51 AM

## 2019-08-14 DIAGNOSIS — H2513 Age-related nuclear cataract, bilateral: Secondary | ICD-10-CM | POA: Diagnosis not present

## 2019-08-14 DIAGNOSIS — G43809 Other migraine, not intractable, without status migrainosus: Secondary | ICD-10-CM | POA: Diagnosis not present

## 2019-08-14 DIAGNOSIS — H04123 Dry eye syndrome of bilateral lacrimal glands: Secondary | ICD-10-CM | POA: Diagnosis not present

## 2019-08-14 DIAGNOSIS — H35413 Lattice degeneration of retina, bilateral: Secondary | ICD-10-CM | POA: Diagnosis not present

## 2019-08-19 DIAGNOSIS — Z95828 Presence of other vascular implants and grafts: Secondary | ICD-10-CM | POA: Diagnosis not present

## 2019-09-08 DIAGNOSIS — U071 COVID-19: Secondary | ICD-10-CM

## 2019-09-08 DIAGNOSIS — J189 Pneumonia, unspecified organism: Secondary | ICD-10-CM

## 2019-09-08 HISTORY — DX: COVID-19: U07.1

## 2019-09-08 HISTORY — DX: Pneumonia, unspecified organism: J18.9

## 2019-09-19 ENCOUNTER — Encounter: Payer: Self-pay | Admitting: Physician Assistant

## 2019-09-19 ENCOUNTER — Encounter (INDEPENDENT_AMBULATORY_CARE_PROVIDER_SITE_OTHER): Payer: Self-pay

## 2019-09-19 ENCOUNTER — Telehealth: Payer: Self-pay

## 2019-09-19 ENCOUNTER — Ambulatory Visit (INDEPENDENT_AMBULATORY_CARE_PROVIDER_SITE_OTHER): Payer: Medicare Other | Admitting: Physician Assistant

## 2019-09-19 DIAGNOSIS — I749 Embolism and thrombosis of unspecified artery: Secondary | ICD-10-CM

## 2019-09-19 DIAGNOSIS — Z20822 Contact with and (suspected) exposure to covid-19: Secondary | ICD-10-CM | POA: Diagnosis not present

## 2019-09-19 DIAGNOSIS — U071 COVID-19: Secondary | ICD-10-CM | POA: Diagnosis not present

## 2019-09-19 NOTE — Progress Notes (Signed)
Virtual Visit via Video   I connected with patient on 09/19/19 at  8:00 AM EST by a video enabled telemedicine application and verified that I am speaking with the correct person using two identifiers.  Location patient: Home Location provider: [facility], Office Persons participating in the virtual visit: Patient, Provider, [place] (Patina Moore)  I discussed the limitations of evaluation and management by telemedicine and the availability of in person appointments. The patient expressed understanding and agreed to proceed.  Subjective:   HPI:   Patient presents via [email or web address] to discuss current respiratory symptoms.  Patient has had about 7 days of nasal congestion, cough, sore throat and fatigue.  Her twin sister and nephew that live with her both started with symptoms at the same time and had tested positive for Covid.  Patient endorses she went for testing but hers was negative.  Does note that this was a self swab however.  Denies any fever or shortness of breath.  Notes chest congestion now that is mainly clear.  Denies chest pain, sinus pain, tooth pain or ear pain.  Is wanting to know if she should consider having antibody testing done since her previous test was negative but she has known exposures.  ROS:   See pertinent positives and negatives per HPI.  Patient Active Problem List   Diagnosis Date Noted  . Abdominal pain 03/08/2019  . Pure hypercholesterolemia 09/25/2017  . Morbid obesity ([place]) 06/07/2017  . Encounter for Medicare annual wellness exam 06/07/2017  . Vitamin D deficiency 06/07/2017  . IBS (irritable bowel syndrome) 08/29/2016  . Migraine 08/29/2016  . Disorder of musculoskeletal system 03/23/2015  . Eye exam abnormal 03/23/2015  . NASH (nonalcoholic steatohepatitis) 10/19/2011    Social History   Tobacco Use  . Smoking status: Never Smoker  . Smokeless tobacco: Never Used  Substance Use Topics  . Alcohol use: No    Alcohol/week: 0.0 standard  drinks    Current Outpatient Medications:  .  apixaban (ELIQUIS) 5 MG TABS tablet, Take 1 tablet (5 mg total) by mouth 2 (two) times daily., Disp: 60 tablet, Rfl: 4 .  Bisacodyl (GENTLE LAXATIVE PO), Take by mouth., Disp: , Rfl:  .  Cholecalciferol (VITAMIN D-3) 1000 UNITS CAPS, Take 1 each by mouth daily., Disp: , Rfl:  .  Digestive Enzymes (ENZYME DIGEST) CAPS, Take by mouth., Disp: , Rfl:  .  NON FORMULARY, ROLFING: Deep Tissue Massage, 2 Txs weekly as needed for joint pain, Disp: , Rfl:  .  Probiotic Product (PROBIOTIC-10 PO), Take 1 tablet by mouth daily., Disp: , Rfl:  .  Trace Min CaCrCuFeKMgMnPSeZn (MINERALS PO), Take 1 oz by mouth daily., Disp: , Rfl:   Allergies  Allergen Reactions  . Celebrex [Celecoxib] Swelling and Rash  . Lactose Intolerance (Gi) Other (See Comments)    Objective:   There were no vitals taken for this visit.  Patient is well-developed, well-nourished in no acute distress.  Resting comfortably at home.  Head is normocephalic, atraumatic.  No labored breathing.  Speech is clear and coherent with logical content.  Patient is alert and oriented at baseline.   Assessment and Plan:   1. Close exposure to COVID-19 virus 2. Suspected COVID-19 virus infection Discussed with her that even though her test was negative it is highly unlikely that she would not have Covid given current symptomology and 2 people within her home who are symptomatic and had tested positive for Covid.  Could likely have been a false negative due  to being a self swab.  Discussed with her she is adamant on being retested she should do through [facility] to contact local health department.  Self swab not a good option for her.  Increase fluids and get plenty of rest.  We will start her on vitamin D3 1000 units daily, vitamin C 1000 mg daily and OTC zinc supplement.  Can use Mucinex DM for congestion or cough.  Continue chronic medications.  Patient enrolled in Covid symptom monitoring  program so we can keep an eye on her.  Strict ER precautions reviewed with patient and her sister. - MyChart COVID-19 home monitoring program; Future    Leeanne Rio, [place] 09/19/2019

## 2019-09-19 NOTE — Patient Instructions (Signed)
Instructions sent to MyChart.   Again despite prior testing your symptoms and history are consistent with COVID. I have placed you in a symptom monitoring program. If you desire to be retested, see the information below/  Please keep hydrated and get plenty of rest. Take daily Vit D3 1000 units, Vit C 1000 mg and OTC zinc supplement. Mucinex-DM for congestion or cough. Consider placing a humidifier in the bedroom. If you have any significant shortness of breath, high fever or note any chest pain you need to be seen at nearest Urgent Care or ER facility.   For testing, go to [email or web address]. You can also contact local Health Department for testing sites near you. [facility] offer both rapid and send out testing.   Quarantine until feeling better!

## 2019-09-19 NOTE — Telephone Encounter (Signed)
Called patient to talk about her COVID-19 survey and her worsening cough. Patient states that she called and spoke with her PCP Elyn Aquas PA, today about her cough. He sent her to [facility] for evaluation. She states her cough is dry but violent. She states she was given two RX, a cough medication and antibiotic. She was advised to also try warm liquids and inhale warm mist to open and sooth the airway. It was also suggested to use hard candy to relieve throat irritation. She was told to continue to respond to her survey daily. If her cough continues to become worse or she develops chest pain or SOB she needs to call 911 and go to the ER for evaluation.  She verbalized understanding of all information.

## 2019-09-19 NOTE — Telephone Encounter (Signed)
Attempted to contact patient about her worsening cough. Left VM to return call to [phone] to speak with a nurse.

## 2019-09-21 ENCOUNTER — Encounter (INDEPENDENT_AMBULATORY_CARE_PROVIDER_SITE_OTHER): Payer: Self-pay

## 2019-09-21 ENCOUNTER — Telehealth: Payer: Self-pay

## 2019-09-21 NOTE — Telephone Encounter (Signed)
Temp yesterday 101.7 and it broke yesterday. Congestion coughing up  yellow phlegm. Is currently on abx. O2 level is between 94-98 %.  Pt temp today 99.0. Advised pt to try a humidifier.  Encouraged pt to monitor her breathing effort and her phlegm. Pt is still coughing up yellow phlegm after being on abx for 2 days. Advised pt to get evaluated in ED if having difficulty breathing or SOB at rest or for any concerns.  Discussed management of fever: Marland Kitchen Temperature is the same and is noted to be less than 100.4:  Continue to monitor at home.  . Advise patient to stay hydrated, push oral fluids if able, and advise pt. to avoid using multiple blankets and layer of clothing to prevent overheating.  . Avoid over use of anti-pyretic medications for fevers less than 101 degrees Fahrenheit. . Fever helps fight infection. . Worsening temperature, treat if temperature is > 101:  treat with OTC anti-fever medications (Tylenol and/or Ibuprofen)  . May alternate Tylenol and Ibuprofen every 3 hours as needed for fever greater than 101. Example: Tylenol at 9AM, Ibuprofen at 12PM, Tylenol at 3PM, Ibuprofen at 6PM, Tylenol at 9PM, Ibuprofen at 12AM, Tylenol at 3AM, Ibuprofen at 6AM. Then restart rotation with Tylenol at [place]. .  If fever remains for greater than 3 days, notify PCP. Marland Kitchen  If fever becomes greater than 103 and unable to reduce with over the counter medication, contact PCP.   Pt verbalized understanding.

## 2019-09-21 NOTE — Telephone Encounter (Signed)
Attempted to call pt and discuss her fever. Per MyChart questionnaire, pt reported a fever of 101.7. LM on VM to call back and discuss sx and plan.

## 2019-09-21 NOTE — Telephone Encounter (Signed)
Call back if get a chance, however fever seems to have broke for now.

## 2019-09-24 ENCOUNTER — Telehealth (INDEPENDENT_AMBULATORY_CARE_PROVIDER_SITE_OTHER): Payer: Medicare Other | Admitting: Physician Assistant

## 2019-09-24 ENCOUNTER — Encounter: Payer: Self-pay | Admitting: Physician Assistant

## 2019-09-24 ENCOUNTER — Ambulatory Visit (INDEPENDENT_AMBULATORY_CARE_PROVIDER_SITE_OTHER): Payer: Medicare Other | Admitting: Family Medicine

## 2019-09-24 ENCOUNTER — Other Ambulatory Visit: Payer: Self-pay

## 2019-09-24 ENCOUNTER — Ambulatory Visit (INDEPENDENT_AMBULATORY_CARE_PROVIDER_SITE_OTHER): Payer: Medicare Other

## 2019-09-24 VITALS — BP 108/64 | HR 96 | Temp 99.0°F | Ht 70.0 in | Wt 311.0 lb

## 2019-09-24 VITALS — HR 84 | Resp 14

## 2019-09-24 DIAGNOSIS — R0602 Shortness of breath: Secondary | ICD-10-CM

## 2019-09-24 DIAGNOSIS — U071 COVID-19: Secondary | ICD-10-CM | POA: Diagnosis not present

## 2019-09-24 DIAGNOSIS — J988 Other specified respiratory disorders: Secondary | ICD-10-CM

## 2019-09-24 DIAGNOSIS — I749 Embolism and thrombosis of unspecified artery: Secondary | ICD-10-CM

## 2019-09-24 MED ORDER — BENZONATATE 100 MG PO CAPS: 100.0000 mg | ORAL_CAPSULE | Freq: Three times a day (TID) | ORAL | 0 refills | Status: DC | PRN

## 2019-09-24 MED ORDER — CYCLOBENZAPRINE HCL 10 MG PO TABS: 10.0000 mg | ORAL_TABLET | Freq: Every day | ORAL | 0 refills | Status: DC

## 2019-09-24 NOTE — Progress Notes (Signed)
Virtual Visit via Video   I connected with patient on 09/24/19 at  3:30 PM EDT by a video enabled telemedicine application and verified that I am speaking with the correct person using two identifiers.  Location patient: Home Location provider: [facility], Office Persons participating in the virtual visit: Patient, Provider, [place] (Patina Moore)  I discussed the limitations of evaluation and management by telemedicine and the availability of in person appointments. The patient expressed understanding and agreed to proceed.  Subjective:   HPI:   Patient presents via Caregility to discuss ongoing/new symptoms since her diagnosis of COVID. Patient notes she was finally doing better until this past weekend. Patient initially started out with symptoms on 09/15/19, the same day her twin sister and nephew (whom she lives with) were diagnosed with COVID. Her initial testing (done outside of out system was negative). She had visit with this provider on 09/19/19 and was informed that her symptoms were COVID related giving multiple close exposures and her symptoms starting at the same time as those individuals and had repeat testing at [facility] same day that was +. Was given steroid taper and Azithromycin by UC provider. Notes she did take the antibiotic but did not take the steroid. Initially improved but over past few days notes significant fatigue and coughing.  Denies chest pain, shortness of breath.  Does note some mild tightness of chest without wheezing.  Denies sinus pain, ear pain or tooth pain.  Denies fever.  Has been keeping a check of her oxygen level and is staying in the mid to upper 90s even with ambulation.  ROS:   See pertinent positives and negatives per HPI.  Patient Active Problem List   Diagnosis Date Noted  . 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 Aortic atherosclerosis ([place]) 03/17/2019  . 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 Abdominal pain 03/08/2019  . Pure hypercholesterolemia 09/25/2017  . Morbid obesity ([place]) 06/07/2017  . Encounter for Medicare annual wellness exam 06/07/2017  . Vitamin D deficiency 06/07/2017  . IBS (irritable bowel syndrome) 08/29/2016  . Migraine 08/29/2016  . Disorder of musculoskeletal system 03/23/2015  . Eye exam abnormal 03/23/2015  . NASH (nonalcoholic steatohepatitis) 10/19/2011    Social History   Tobacco Use  . Smoking status: Never Smoker  . Smokeless tobacco: Never Used  Substance Use Topics  . Alcohol use: No    Alcohol/week: 0.0 standard drinks    Current Outpatient Medications:  .  apixaban (ELIQUIS) 5 MG TABS tablet, Take 1 tablet (5 mg total) by mouth 2 (two) times daily., Disp: 60 tablet, Rfl: 4 .  Bisacodyl (GENTLE LAXATIVE PO), Take by mouth., Disp: , Rfl:  .  Cholecalciferol (VITAMIN D-3) 1000 UNITS CAPS, Take 1 each by mouth daily., Disp: , Rfl:  .  Digestive Enzymes (ENZYME DIGEST) CAPS, Take by mouth., Disp: , Rfl:  .  NON FORMULARY, ROLFING: Deep Tissue Massage, 2 Txs weekly as needed for joint pain, Disp: , Rfl:  .  Probiotic Product (PROBIOTIC-10 PO), Take 1 tablet by mouth daily., Disp: , Rfl:  .  Trace Min CaCrCuFeKMgMnPSeZn (MINERALS PO), Take 1 oz by mouth daily., Disp: , Rfl:   Allergies  Allergen Reactions  . Celebrex [Celecoxib] Swelling and Rash  . Lactose Intolerance (Gi) Other (See Comments)    Objective:   There were no vitals taken for this visit.  Patient is well-developed, well-nourished in no acute  distress.  Resting comfortably at home.  Head is normocephalic, atraumatic.  No labored breathing.  Speech is clear and coherent with logical content.  Patient is alert and oriented at baseline.   Assessment and Plan:   1. COVID-19 virus infection Improving.  Initially with thoughts of acute bacterial bronchitis.  Patient has completed course of azithromycin.  No longer having productive  cough or fever but noticing significant fatigue.  O2 stable today.  Did have her ambulate around her house without decrease in O2 saturation.  Will have her go to our respiratory clinic for CXR and labs to further assess. Rx Tessalon. Start Mucinex. Continue Vitamin regimen.  - DG Chest 2 View; Future - CBC w/Diff; Future - Comp Met (CMET); Future   Leeanne Rio, PA-C 09/24/2019

## 2019-09-24 NOTE — Patient Instructions (Signed)
I have prescribed Cyclobenzaprine 10 mg for bedtime to help with muscle pain.  And you can take along with extra strength Tylenol 500 mg every 4-6 hours as needed. Start benzonatate pearls 100 mg 3 times daily as needed for cough. Recommend taking vitamin C 500 mg once daily and zinc 50 mg once daily along with the vitamin D already taking at 5000 units/day to facilitate quicker recovery for symptoms. I have discussed that he has developed shortness of breath, worsening weakness or chest pain immediately to the emergency department for further evaluation. Your PCP will follow up with you regarding labs results.     COVID-19 COVID-19 is a respiratory infection that is caused by a virus called severe acute respiratory syndrome coronavirus 2 (SARS-CoV-2). The disease is also known as coronavirus disease or novel coronavirus. In some people, the virus may not cause any symptoms. In others, it may cause a serious infection. The infection can get worse quickly and can lead to complications, such as:  Pneumonia, or infection of the lungs.  Acute respiratory distress syndrome or ARDS. This is a condition in which fluid build-up in the lungs prevents the lungs from filling with air and passing oxygen into the blood.  Acute respiratory failure. This is a condition in which there is not enough oxygen passing from the lungs to the body or when carbon dioxide is not passing from the lungs out of the body.  Sepsis or septic shock. This is a serious bodily reaction to an infection.  Blood clotting problems.  Secondary infections due to bacteria or fungus.  Organ failure. This is when your body's organs stop working. The virus that causes COVID-19 is contagious. This means that it can spread from person to person through droplets from coughs and sneezes (respiratory secretions). What are the causes? This illness is caused by a virus. You may catch the virus by:  Breathing in droplets from an infected  person. Droplets can be spread by a person breathing, speaking, singing, coughing, or sneezing.  Touching something, like a table or a doorknob, that was exposed to the virus (contaminated) and then touching your mouth, nose, or eyes. What increases the risk? Risk for infection You are more likely to be infected with this virus if you:  Are within 6 feet (2 meters) of a person with COVID-19.  Provide care for or live with a person who is infected with COVID-19.  Spend time in crowded indoor spaces or live in shared housing. Risk for serious illness You are more likely to become seriously ill from the virus if you:  Are 68 years of age or older. The higher your age, the more you are at risk for serious illness.  Live in a nursing home or long-term care facility.  Have cancer.  Have a long-term (chronic) disease such as: ? Chronic lung disease, including chronic obstructive pulmonary disease or asthma. ? A long-term disease that lowers your body's ability to fight infection (immunocompromised). ? Heart disease, including heart failure, a condition in which the arteries that lead to the heart become narrow or blocked (coronary artery disease), a disease which makes the heart muscle thick, weak, or stiff (cardiomyopathy). ? Diabetes. ? Chronic kidney disease. ? Sickle cell disease, a condition in which red blood cells have an abnormal "sickle" shape. ? Liver disease.  Are obese. What are the signs or symptoms? Symptoms of this condition can range from mild to severe. Symptoms may appear any time from 2 to 14 days  after being exposed to the virus. They include:  A fever or chills.  A cough.  Difficulty breathing.  Headaches, body aches, or muscle aches.  Runny or stuffy (congested) nose.  A sore throat.  New loss of taste or smell. Some people may also have stomach problems, such as nausea, vomiting, or diarrhea. Other people may not have any symptoms of COVID-19. How is  this diagnosed? This condition may be diagnosed based on:  Your signs and symptoms, especially if: ? You live in an area with a COVID-19 outbreak. ? You recently traveled to or from an area where the virus is common. ? You provide care for or live with a person who was diagnosed with COVID-19. ? You were exposed to a person who was diagnosed with COVID-19.  A physical exam.  Lab tests, which may include: ? Taking a sample of fluid from the back of your nose and throat (nasopharyngeal fluid), your nose, or your throat using a swab. ? A sample of mucus from your lungs (sputum). ? Blood tests.  Imaging tests, which may include, X-rays, CT scan, or ultrasound. How is this treated? At present, there is no medicine to treat COVID-19. Medicines that treat other diseases are being used on a trial basis to see if they are effective against COVID-19. Your health care provider will talk with you about ways to treat your symptoms. For most people, the infection is mild and can be managed at home with rest, fluids, and over-the-counter medicines. Treatment for a serious infection usually takes places in a hospital intensive care unit (ICU). It may include one or more of the following treatments. These treatments are given until your symptoms improve.  Receiving fluids and medicines through an IV.  Supplemental oxygen. Extra oxygen is given through a tube in the nose, a face mask, or a hood.  Positioning you to lie on your stomach (prone position). This makes it easier for oxygen to get into the lungs.  Continuous positive airway pressure (CPAP) or bi-level positive airway pressure (BPAP) machine. This treatment uses mild air pressure to keep the airways open. A tube that is connected to a motor delivers oxygen to the body.  Ventilator. This treatment moves air into and out of the lungs by using a tube that is placed in your windpipe.  Tracheostomy. This is a procedure to create a hole in the neck  so that a breathing tube can be inserted.  Extracorporeal membrane oxygenation (ECMO). This procedure gives the lungs a chance to recover by taking over the functions of the heart and lungs. It supplies oxygen to the body and removes carbon dioxide. Follow these instructions at home: Lifestyle  If you are sick, stay home except to get medical care. Your health care provider will tell you how long to stay home. Call your health care provider before you go for medical care.  Rest at home as told by your health care provider.  Do not use any products that contain nicotine or tobacco, such as cigarettes, e-cigarettes, and chewing tobacco. If you need help quitting, ask your health care provider.  Return to your normal activities as told by your health care provider. Ask your health care provider what activities are safe for you. General instructions  Take over-the-counter and prescription medicines only as told by your health care provider.  Drink enough fluid to keep your urine pale yellow.  Keep all follow-up visits as told by your health care provider. This is important. How  is this prevented?  There is no vaccine to help prevent COVID-19 infection. However, there are steps you can take to protect yourself and others from this virus. To protect yourself:   Do not travel to areas where COVID-19 is a risk. The areas where COVID-19 is reported change often. To identify high-risk areas and travel restrictions, check the CDC travel website: FatFares.com.br  If you live in, or must travel to, an area where COVID-19 is a risk, take precautions to avoid infection. ? Stay away from people who are sick. ? Wash your hands often with soap and water for 20 seconds. If soap and water are not available, use an alcohol-based hand sanitizer. ? Avoid touching your mouth, face, eyes, or nose. ? Avoid going out in public, follow guidance from your state and local health authorities. ? If you  must go out in public, wear a cloth face covering or face mask. Make sure your mask covers your nose and mouth. ? Avoid crowded indoor spaces. Stay at least 6 feet (2 meters) away from others. ? Disinfect objects and surfaces that are frequently touched every day. This may include:  Counters and tables.  Doorknobs and light switches.  Sinks and faucets.  Electronics, such as phones, remote controls, keyboards, computers, and tablets. To protect others: If you have symptoms of COVID-19, take steps to prevent the virus from spreading to others.  If you think you have a COVID-19 infection, contact your health care provider right away. Tell your health care team that you think you may have a COVID-19 infection.  Stay home. Leave your house only to seek medical care. Do not use public transport.  Do not travel while you are sick.  Wash your hands often with soap and water for 20 seconds. If soap and water are not available, use alcohol-based hand sanitizer.  Stay away from other members of your household. Let healthy household members care for children and pets, if possible. If you have to care for children or pets, wash your hands often and wear a mask. If possible, stay in your own room, separate from others. Use a different bathroom.  Make sure that all people in your household wash their hands well and often.  Cough or sneeze into a tissue or your sleeve or elbow. Do not cough or sneeze into your hand or into the air.  Wear a cloth face covering or face mask. Make sure your mask covers your nose and mouth. Where to find more information  Centers for Disease Control and Prevention: PurpleGadgets.be  World Health Organization: https://www.castaneda.info/ Contact a health care provider if:  You live in or have traveled to an area where COVID-19 is a risk and you have symptoms of the infection.  You have had contact with someone who has COVID-19  and you have symptoms of the infection. Get help right away if:  You have trouble breathing.  You have pain or pressure in your chest.  You have confusion.  You have bluish lips and fingernails.  You have difficulty waking from sleep.  You have symptoms that get worse. These symptoms may represent a serious problem that is an emergency. Do not wait to see if the symptoms will go away. Get medical help right away. Call your local emergency services (911 in the [place] Do not drive yourself to the hospital. Let the emergency medical personnel know if you think you have COVID-19. Summary  COVID-19 is a respiratory infection that is caused by a virus.  It is also known as coronavirus disease or novel coronavirus. It can cause serious infections, such as pneumonia, acute respiratory distress syndrome, acute respiratory failure, or sepsis.  The virus that causes COVID-19 is contagious. This means that it can spread from person to person through droplets from breathing, speaking, singing, coughing, or sneezing.  You are more likely to develop a serious illness if you are 13 years of age or older, have a weak immune system, live in a nursing home, or have chronic disease.  There is no medicine to treat COVID-19. Your health care provider will talk with you about ways to treat your symptoms.  Take steps to protect yourself and others from infection. Wash your hands often and disinfect objects and surfaces that are frequently touched every day. Stay away from people who are sick and wear a mask if you are sick. This information is not intended to replace advice given to you by your health care provider. Make sure you discuss any questions you have with your health care provider. Document Revised: 04/25/2019 Document Reviewed: 08/01/2018 Elsevier Patient Education  [place].

## 2019-09-24 NOTE — Progress Notes (Signed)
Patient ID: Leslie Knapp, female    DOB: 24-Dec-1950, 69 y.o.   MRN: [id number]  PCP: Brunetta Jeans, PA-C  Chief Complaint  Patient presents with  . Covid positive    start Azithromycin 500 mg on 09/19/19  . Weakness    Subjective:  HPI Leslie Knapp is a 69 y.o. female presents to [facility] for evaluation of symptoms related to COVID-19 infection dx on 09/19/19. Her entire household was tested positive for COVID-19. She has had some intermittent fever with the highest reading of 101.7 occurring this morning for which she treated with ibuprofen.  She has a mild cough only occasionally however which has improved.  She continues to have some mild body aches but her most worrisome symptom include fatigue and weakness with limited activity.  She reports feeling that her arms are rather heavy when doing dishes and if she stands for only a few minutes she is very fatigued and weakened.  She was prescribed azithromycin and a course of steroids.  She has started azithromycin however did not take the prednisone.  She is currently prescribed chronic anticoagulation therapy due to history of pulmonary embolisms and she also has a inferior vena cava filter.  She continues to monitor her oxygen level at home and it has remained within normal range greater than with readings greater than 95%.  She is able to tolerate food and fluids.  Denies any GI symptoms.  Review of Systems Pertinent negatives listed in HPI Patient Active Problem List   Diagnosis Date Noted  . 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 Aortic atherosclerosis ([place]) 03/17/2019  . 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 Abdominal pain 03/08/2019  . Pure hypercholesterolemia 09/25/2017  . Morbid obesity ([place]) 06/07/2017  . Encounter for Medicare annual wellness exam 06/07/2017  . Vitamin D deficiency 06/07/2017  . IBS (irritable bowel  syndrome) 08/29/2016  . Migraine 08/29/2016  . Disorder of musculoskeletal system 03/23/2015  . Eye exam abnormal 03/23/2015  . NASH (nonalcoholic steatohepatitis) 10/19/2011      Prior to Admission medications   Medication Sig Start Date End Date Taking? Authorizing Provider  apixaban (ELIQUIS) 5 MG TABS tablet Take 1 tablet (5 mg total) by mouth 2 (two) times daily. 08/11/19   [place], Holli Humbles, NP  benzonatate (TESSALON) 100 MG capsule Take 1 capsule (100 mg total) by mouth 3 (three) times daily as needed for cough. 09/24/19   Brunetta Jeans, PA-C  Bisacodyl (GENTLE LAXATIVE PO) Take by mouth.    [provider]  Cholecalciferol (VITAMIN D-3) 1000 UNITS CAPS Take 1 each by mouth daily.    [provider]  Digestive Enzymes (ENZYME DIGEST) CAPS Take by mouth.    [provider]  NON FORMULARY ROLFING: Deep Tissue Massage, 2 Txs weekly as needed for joint pain    [provider]  Probiotic Product (PROBIOTIC-10 PO) Take 1 tablet by mouth daily.    [provider]  Trace Min [id number] (MINERALS PO) Take 1 oz by mouth daily.    [provider]    Past Medical, Surgical Family and Social History reviewed and updated.    Objective:   Today's Vitals   09/24/19 1816  BP: 108/64  Pulse: 96  Temp: 99 F (37.2 C)  SpO2: 99%  Weight: (!) 311 lb (141.1 kg)  Height: 5\' 10"  (1.778 m)    Wt Readings from Last 3 Encounters:  08/11/19 (!) 323 lb 1.9 oz (146.6 kg)  06/23/19 (!) 322 lb (146.1 kg)  05/19/19 (!) 322 lb 1.9 oz (146.1 kg)     Physical Exam General appearance: alert, obese, appears ill, cooperative and in no distress Head: Normocephalic, without obvious abnormality, atraumatic Respiratory: Respirations even and unlabored, normal respiratory rate, negative adventitious lung sounds Heart: rate and rhythm normal. No gallop or murmurs noted on exam  Abdomen: BS +, no distention, no rebound tenderness, or no  mass Extremities: No gross deformities, No BLE edema  Skin: Skin color, texture, turgor normal. No rashes seen  Psych: Normal mood and Flat affect Neurologic: Alert, oriented to person, place, and time, thought content appropriate.   Assessment & Plan:  1. Respiratory tract infection due to COVID-19 virus -Patient is completing azithromycin -Respiratory exam today is unremarkable lungs are audibly clear -Advised to continue to monitor oxygen level and if cough develops into a persistent cough and/or productive cough I would recommend follow-up here for chest x-ray.  2. Shortness of breath, resolved  -Patient not actively having any shortness of breath as of today however continues to endorse feelings of weakness.  -Encouraged to continue to monitor pulse oximetry level. -Breathing along with oxygen level is normal which is reassuring today. -Red flags discussed such as increased work of breathing and chest tightness or chest pressure patient advised if any of the symptoms present go immediately to the emergency department  3. COVID-19 Infection, stable -Continues to be symptomatic with intermittent fevers, body aches, fatigue and weakness. -Encouraged activity as tolerated along with frequent.  Is at rest -Encourage avoidance of prolonged inactivity as this increases risk for pneumonia and increases hypercoagulable which can lead to clot formation   -Encouraged patient as course of recovery can be prolonged. -Advised of POST-Covid clinic follow-up if symptoms due not improve   Primary care provider ordered labs which were collected and will result back to his office.  -The patient was given clear instructions to go to ER or return to medical center if symptoms do not improve, worsen or new problems develop. The patient verbalized understanding.     Molli Barrows, FNP-C [facility], PRN Provider  [facility]. [place], [place] [facility] Phone: [phone] Clinic Fax:  [phone] Clinic Hours: 5:30 pm -7:30 pm (Monday-Friday)

## 2019-09-24 NOTE — Patient Instructions (Signed)
Instructions sent to MyChart.  Please keep well-hydrated and get plenty of rest. Start the new cough medicine as sent to your pharmacy. Take over-the-counter Mucinex (plain) twice daily with a glass of water. This will help thin congestion.  Please go to the respiratory clinic as scheduled so we can obtain a chest x-ray and lab work. I will call you as soon as I have results and we will determine next steps.  If you notice any new or worsening symptoms, please let us know ASAP. If you note any increased work of breathing or oxygen levels dropping, I want you to be seen at nearest emergency room.  Hang in there!

## 2019-09-25 LAB — CBC WITH DIFFERENTIAL/PLATELET
Basophils Absolute: 0 10*3/uL (ref 0.0–0.2)
Basos: 0 %
EOS (ABSOLUTE): 0.1 10*3/uL (ref 0.0–0.4)
Eos: 1 %
Hematocrit: 51 % — ABNORMAL HIGH (ref 34.0–46.6)
Hemoglobin: 16.8 g/dL — ABNORMAL HIGH (ref 11.1–15.9)
Immature Grans (Abs): 0 10*3/uL (ref 0.0–0.1)
Immature Granulocytes: 0 %
Lymphocytes Absolute: 1.5 10*3/uL (ref 0.7–3.1)
Lymphs: 31 %
MCH: 28.3 pg (ref 26.6–33.0)
MCHC: 32.9 g/dL (ref 31.5–35.7)
MCV: 86 fL (ref 79–97)
Monocytes Absolute: 0.4 10*3/uL (ref 0.1–0.9)
Monocytes: 8 %
Neutrophils Absolute: 3 10*3/uL (ref 1.4–7.0)
Neutrophils: 60 %
Platelets: 176 10*3/uL (ref 150–450)
RBC: 5.94 x10E6/uL — ABNORMAL HIGH (ref 3.77–5.28)
RDW: 14.4 % (ref 11.7–15.4)
WBC: 5 10*3/uL (ref 3.4–10.8)

## 2019-09-25 LAB — COMPREHENSIVE METABOLIC PANEL
ALT: 17 IU/L (ref 0–32)
AST: 28 IU/L (ref 0–40)
Albumin/Globulin Ratio: 1.6 (ref 1.2–2.2)
Albumin: 4.1 g/dL (ref 3.8–4.8)
Alkaline Phosphatase: 92 IU/L (ref 39–117)
BUN/Creatinine Ratio: 22 (ref 12–28)
BUN: 15 mg/dL (ref 8–27)
Bilirubin Total: 0.3 mg/dL (ref 0.0–1.2)
CO2: 20 mmol/L (ref 20–29)
Calcium: 9.9 mg/dL (ref 8.7–10.3)
Chloride: 103 mmol/L (ref 96–106)
Creatinine, Ser: 0.69 mg/dL (ref 0.57–1.00)
GFR calc Af Amer: 103 mL/min/{1.73_m2} (ref >59)
GFR calc non Af Amer: 90 mL/min/{1.73_m2} (ref >59)
Globulin, Total: 2.6 g/dL (ref 1.5–4.5)
Glucose: 104 mg/dL — ABNORMAL HIGH (ref 65–99)
Potassium: 4.7 mmol/L (ref 3.5–5.2)
Sodium: 140 mmol/L (ref 134–144)
Total Protein: 6.7 g/dL (ref 6.0–8.5)

## 2019-09-26 ENCOUNTER — Telehealth: Payer: Self-pay | Admitting: *Deleted

## 2019-09-26 ENCOUNTER — Encounter (INDEPENDENT_AMBULATORY_CARE_PROVIDER_SITE_OTHER): Payer: Self-pay

## 2019-09-26 ENCOUNTER — Encounter: Payer: Self-pay | Admitting: Physician Assistant

## 2019-09-26 NOTE — Telephone Encounter (Signed)
Call to patient- presently she does not have temperature- although she states it tends to spike at night and then get better during daytime hours. Advised to continue fever protocol and she is aware of what to do- hydration, OTC treatment, etc. Patient has been keeping in close contact with her PCP and has had recent labs and chest x-ray which are all normal- encouraging. Patient states her O2 sat is normal and she ranges 94-96. Patient has been instructed by PCP on monitoring and what to do if it should drop. Patient reports she is fatigued and still has cough. She is drinking and trying to eat normally. Discuss viral course and that her recovery may take some time. Reviewed reasons to seek help- trouble breathing, SOB when sitting still, dehydration, severe weakness- she is aware and will seek help if needed. Encouragement given on the long recovery from [place] and to continue her daily COVID assessment.

## 2019-09-28 ENCOUNTER — Telehealth: Payer: Self-pay

## 2019-09-28 ENCOUNTER — Other Ambulatory Visit: Payer: Self-pay | Admitting: Physician Assistant

## 2019-09-28 ENCOUNTER — Encounter (INDEPENDENT_AMBULATORY_CARE_PROVIDER_SITE_OTHER): Payer: Self-pay

## 2019-09-28 DIAGNOSIS — U071 COVID-19: Secondary | ICD-10-CM

## 2019-09-28 MED ORDER — SODIUM CHLORIDE 0.9 % IV SOLN
700.0000 mg | Freq: Once | INTRAVENOUS | Status: AC
  Administered 2019-09-29: 09:00:00 700 mg via INTRAVENOUS
  Filled 2019-09-28: qty 20

## 2019-09-28 NOTE — Progress Notes (Signed)
  I connected by phone with Leslie Knapp on 09/28/2019 at 7:10 PM to discuss the potential use of an new treatment for mild to moderate COVID-19 viral infection in non-hospitalized patients.  This patient is a 69 y.o. female that meets the FDA criteria for Emergency Use Authorization of bamlanivimab or casirivimab\imdevimab.  Has a (+) direct SARS-CoV-2 viral test result  Has mild or moderate COVID-19   Is ? 69 years of age and weighs ? 40 kg  Is NOT hospitalized due to COVID-19  Is NOT requiring oxygen therapy or requiring an increase in baseline oxygen flow rate due to COVID-19  Is within 10 days of symptom onset  Has at least one of the high risk factor(s) for progression to severe COVID-19 and/or hospitalization as defined in EUA.  Specific high risk criteria : >/= 68 yo and BMI>35   I have spoken and communicated the following to the patient or parent/caregiver:  1. FDA has authorized the emergency use of bamlanivimab and casirivimab\imdevimab for the treatment of mild to moderate COVID-19 in adults and pediatric patients with positive results of direct SARS-CoV-2 viral testing who are 70 years of age and older weighing at least 40 kg, and who are at high risk for progressing to severe COVID-19 and/or hospitalization.  2. The significant known and potential risks and benefits of bamlanivimab and casirivimab\imdevimab, and the extent to which such potential risks and benefits are unknown.  3. Information on available alternative treatments and the risks and benefits of those alternatives, including clinical trials.  4. Patients treated with bamlanivimab and casirivimab\imdevimab should continue to self-isolate and use infection control measures (e.g., wear mask, isolate, social distance, avoid sharing personal items, clean and disinfect "high touch" surfaces, and frequent handwashing) according to CDC guidelines.   5. The patient or parent/caregiver has the option to accept or  refuse bamlanivimab or casirivimab\imdevimab .  After reviewing this information with the patient, The patient agreed to proceed with receiving the bamlanimivab infusion and will be provided a copy of the Fact sheet prior to receiving the infusion.   Sx onset 09/19/19. Infusion set up for tomorrow at 8:30am. Directions given.    Angelena Form 09/28/2019 7:10 PM

## 2019-09-28 NOTE — Telephone Encounter (Signed)
101 yesterday and today. Last night it broke after Tylenol. Fevers since Wednesday. Cough: moist- Nasal congestion day of testing.  Fever for several days. Pt experiencing PND. Pt has had fever for   Days. Xray on 09/24/19 (Wednesday) and it was clear. Very weak, 100-101 fever every day. Does come down with Tylenol.  H/O PE. Oxygen 91-93% Using incentive spirometer 1550 ml.  C/o sore throat. Pt denies pain with deep inhalation. Using [place] throat drops. SOB with activity.  Using a walker for assistance with ambulation. Pt has walker for use occasionally due to issue with leg years ago. Pt stated the weakness has improved. Pt drinking fluids well. O2 sat while ambulating 94%. Frequent cough. Had been on 5 day abx but did not take the steroids. Per pt weakness has improved. Advised to use warm saline gargles, warm soups and popsicle's. Advised to call 911 for worsening sx (weakness, chest pain, or pain with deep breathing, SOB at rest, Low O2 readings on pulse oximeter).   1331: 110/56  HR 88  101  93 % R/A  Spot check O2 92% -96%  Sister Remo Lipps) who is an Therapist, sports lives and is caring for pt. 1430: spot check pulse ox: 92 %  Pt is unable to take Ibuprofen (is on blood thinners). Pt has been seen and xray taken was clear on 09/24/19. Pt biggest complaint is severe sore throat from PND. Pt stated the tessalon perels are not working due to the constant drainage.  Sister asked if pt was a candidate for the covid infusion. Called and LM on the covid line with pt information.  Advised for  Weakness:If patient has worsening weakness with inability to stand or if patient has to hold on to something to get balance, advise patient to call 911 and seek treatment in ED  . Fever:Temperature is the same and is noted to be less than 100.4:  Continue to monitor at home.  . Advise patient to stay hydrated, push oral fluids if able, and advise pt. to avoid using multiple blankets and layer of clothing to prevent  overheating.  . Avoid over use of anti-pyretic medications for fevers less than 101 degrees Fahrenheit. Fever helps fight infection -Worsening temperature, treat if temperature is > 101:  If fever becomes greater than 103 and unable to reduce with over the counter medication, contact PCP.   .  [id number] of breath is the same:   continue to monitor at home  If symptoms become severe, i.e. shortness of breath at rest, gasping for air, wheezing, call 911 and seek treatment in the ED  Will forward note to PCP for review. Also, pt was previously prescribed a round of steroids and did not take them. Advised to hold off taking them for now. Advised to call PCP tomorrow am to check in with updates.  Please have provider review note.

## 2019-09-29 ENCOUNTER — Telehealth: Payer: Self-pay | Admitting: Physician Assistant

## 2019-09-29 ENCOUNTER — Ambulatory Visit (HOSPITAL_COMMUNITY)
Admission: RE | Admit: 2019-09-29 | Discharge: 2019-09-29 | Disposition: A | Discharge status: Home / Self Care | Payer: Medicare Other | Source: Ambulatory Visit | Attending: Pulmonary Disease | Admitting: Pulmonary Disease

## 2019-09-29 DIAGNOSIS — Z23 Encounter for immunization: Secondary | ICD-10-CM | POA: Diagnosis not present

## 2019-09-29 DIAGNOSIS — U071 COVID-19: Secondary | ICD-10-CM | POA: Diagnosis not present

## 2019-09-29 MED ORDER — ALBUTEROL SULFATE HFA 108 (90 BASE) MCG/ACT IN AERS: 2.0000 | INHALATION_SPRAY | Freq: Once | RESPIRATORY_TRACT | Status: DC | PRN

## 2019-09-29 MED ORDER — METHYLPREDNISOLONE SODIUM SUCC 125 MG IJ SOLR: 125.0000 mg | Freq: Once | INTRAMUSCULAR | Status: DC | PRN

## 2019-09-29 MED ORDER — SODIUM CHLORIDE 0.9 % IV SOLN: INTRAVENOUS | Status: DC | PRN

## 2019-09-29 MED ORDER — FAMOTIDINE IN NACL 20-0.9 MG/50ML-% IV SOLN: 20.0000 mg | Freq: Once | INTRAVENOUS | Status: DC | PRN

## 2019-09-29 MED ORDER — EPINEPHRINE 0.3 MG/0.3ML IJ SOAJ: 0.3000 mg | Freq: Once | INTRAMUSCULAR | Status: DC | PRN

## 2019-09-29 MED ORDER — DIPHENHYDRAMINE HCL 50 MG/ML IJ SOLN: 50.0000 mg | Freq: Once | INTRAMUSCULAR | Status: DC | PRN

## 2019-09-29 NOTE — Telephone Encounter (Signed)
Faxed

## 2019-09-29 NOTE — Progress Notes (Signed)
  Diagnosis: COVID-19  Physician: Dr. Joya Gaskins  Procedure: [facility] Med: bamlanivimab infusion - Provided patient with bamlanimivab fact sheet for patients, parents and caregivers prior to infusion.  Complications: No immediate complications noted.  Discharge: Discharged home   Leslie Knapp 09/29/2019

## 2019-09-29 NOTE — Telephone Encounter (Signed)
Please advise 

## 2019-09-29 NOTE — Telephone Encounter (Signed)
Orders signed. Given back to CMA for fax.  

## 2019-09-29 NOTE — Progress Notes (Signed)
  Diagnosis: COVID-19  Physician:  Procedure: [facility] Med: bamlanivimab infusion - Provided patient with bamlanimivab fact sheet for patients, parents and caregivers prior to infusion.  Complications: No immediate complications noted.  Discharge: Discharged home   [place], [place] 09/29/2019

## 2019-09-29 NOTE — Telephone Encounter (Signed)
Would stay the current course. She just received antibody infusion today so in the absence of shortness of breath, low oxygen, etc I would not start the steroid. Tylenol for fever. If cough is still quite significant I can send in a new cough syrup for her (let me know). Watch symptoms over the next 48 hours to see if infusion helps her turn the corner with fever, etc. If not improving or anything worsening would want to set her back up with respiratory clinic. ER for severe symptoms.

## 2019-09-29 NOTE — Discharge Instructions (Signed)

## 2019-09-29 NOTE — Telephone Encounter (Signed)
Spoke with patient and her last temp 101F, cough. Over the weekend had a 102.4 F fever. She did receive the IV fusion this morning. She has the steroid from the urgent care. She is wanting to know if she should take the steroid. She is very weak. She has not had tylenol today. She is staying hydrated-very thirsty. Pulse O2 94%

## 2019-09-29 NOTE — Telephone Encounter (Signed)
I have placed home health orders in the bin up front with a charge sheet  

## 2019-09-29 NOTE — Telephone Encounter (Signed)
LMOVM for return call for PCP recommendations

## 2019-09-29 NOTE — Telephone Encounter (Signed)
Paperwork in your folder for review and signature

## 2019-09-29 NOTE — Telephone Encounter (Addendum)
Spoke with patient and gave PCP recommendations. She denies any sob or low O2. Advised to not start the steroid. Continue to watch symptoms over the next 2 days since getting IV infusion today. If notice worsening of symptoms or not improving to contact the office. May need to be seen at respiratory clinic for evaluation She is currently taking Tessalon for the cough which helps.

## 2019-09-29 NOTE — Telephone Encounter (Signed)
Just received and reviewed. Please call to check on patient today.

## 2019-10-01 ENCOUNTER — Ambulatory Visit (INDEPENDENT_AMBULATORY_CARE_PROVIDER_SITE_OTHER): Payer: Medicare Other | Admitting: Family Medicine

## 2019-10-01 ENCOUNTER — Ambulatory Visit (INDEPENDENT_AMBULATORY_CARE_PROVIDER_SITE_OTHER): Payer: Medicare Other

## 2019-10-01 VITALS — BP 100/70 | HR 92 | Temp 99.1°F

## 2019-10-01 DIAGNOSIS — I959 Hypotension, unspecified: Secondary | ICD-10-CM | POA: Diagnosis not present

## 2019-10-01 DIAGNOSIS — U071 COVID-19: Secondary | ICD-10-CM

## 2019-10-01 DIAGNOSIS — R0602 Shortness of breath: Secondary | ICD-10-CM | POA: Diagnosis not present

## 2019-10-01 DIAGNOSIS — R05 Cough: Secondary | ICD-10-CM

## 2019-10-01 DIAGNOSIS — D6869 Other thrombophilia: Secondary | ICD-10-CM | POA: Diagnosis not present

## 2019-10-01 DIAGNOSIS — J1282 Pneumonia due to coronavirus disease 2019: Secondary | ICD-10-CM | POA: Diagnosis not present

## 2019-10-01 DIAGNOSIS — I749 Embolism and thrombosis of unspecified artery: Secondary | ICD-10-CM

## 2019-10-01 DIAGNOSIS — R059 Cough, unspecified: Secondary | ICD-10-CM

## 2019-10-01 NOTE — Telephone Encounter (Signed)
FYI: Patient called in stating she is not feeling better after two days. Shad had the antibody infusion. Patient reports drainage that is blood tinged, nonproductive cough, and hurts to take a deep breath. Patient states her throat is bothering her. Still running fevers. Patient states she wants to be seen by the respiratory clinic again to be assessed to make sure their is nothing in her lungs. Based off PA Martin's last recommendations, if patient is not feeling better after two days, will schedule patient with respiratory clinic.  Called patient back and an appointment was scheduled for 10/01/19 at the respiratory clinic at 6:30 pm. Patient voiced understanding.

## 2019-10-01 NOTE — Patient Instructions (Signed)
Go immediately to Emergency Department for further evaluation of your symptoms of possible Pneumonia vs Pulmonary embolism . Your blood pressure is low and you may require fluid hydration.     COVID-19 COVID-19 is a respiratory infection that is caused by a virus called severe acute respiratory syndrome coronavirus 2 (SARS-CoV-2). The disease is also known as coronavirus disease or novel coronavirus. In some people, the virus may not cause any symptoms. In others, it may cause a serious infection. The infection can get worse quickly and can lead to complications, such as:  Pneumonia, or infection of the lungs.  Acute respiratory distress syndrome or ARDS. This is a condition in which fluid build-up in the lungs prevents the lungs from filling with air and passing oxygen into the blood.  Acute respiratory failure. This is a condition in which there is not enough oxygen passing from the lungs to the body or when carbon dioxide is not passing from the lungs out of the body.  Sepsis or septic shock. This is a serious bodily reaction to an infection.  Blood clotting problems.  Secondary infections due to bacteria or fungus.  Organ failure. This is when your body's organs stop working. The virus that causes COVID-19 is contagious. This means that it can spread from person to person through droplets from coughs and sneezes (respiratory secretions). What are the causes? This illness is caused by a virus. You may catch the virus by:  Breathing in droplets from an infected person. Droplets can be spread by a person breathing, speaking, singing, coughing, or sneezing.  Touching something, like a table or a doorknob, that was exposed to the virus (contaminated) and then touching your mouth, nose, or eyes. What increases the risk? Risk for infection You are more likely to be infected with this virus if you:  Are within 6 feet (2 meters) of a person with COVID-19.  Provide care for or live with a  person who is infected with COVID-19.  Spend time in crowded indoor spaces or live in shared housing. Risk for serious illness You are more likely to become seriously ill from the virus if you:  Are 68 years of age or older. The higher your age, the more you are at risk for serious illness.  Live in a nursing home or long-term care facility.  Have cancer.  Have a long-term (chronic) disease such as: ? Chronic lung disease, including chronic obstructive pulmonary disease or asthma. ? A long-term disease that lowers your body's ability to fight infection (immunocompromised). ? Heart disease, including heart failure, a condition in which the arteries that lead to the heart become narrow or blocked (coronary artery disease), a disease which makes the heart muscle thick, weak, or stiff (cardiomyopathy). ? Diabetes. ? Chronic kidney disease. ? Sickle cell disease, a condition in which red blood cells have an abnormal "sickle" shape. ? Liver disease.  Are obese. What are the signs or symptoms? Symptoms of this condition can range from mild to severe. Symptoms may appear any time from 2 to 14 days after being exposed to the virus. They include:  A fever or chills.  A cough.  Difficulty breathing.  Headaches, body aches, or muscle aches.  Runny or stuffy (congested) nose.  A sore throat.  New loss of taste or smell. Some people may also have stomach problems, such as nausea, vomiting, or diarrhea. Other people may not have any symptoms of COVID-19. How is this diagnosed? This condition may be diagnosed based on:  Your signs and symptoms, especially if: ? You live in an area with a COVID-19 outbreak. ? You recently traveled to or from an area where the virus is common. ? You provide care for or live with a person who was diagnosed with COVID-19. ? You were exposed to a person who was diagnosed with COVID-19.  A physical exam.  Lab tests, which may include: ? Taking a sample  of fluid from the back of your nose and throat (nasopharyngeal fluid), your nose, or your throat using a swab. ? A sample of mucus from your lungs (sputum). ? Blood tests.  Imaging tests, which may include, X-rays, CT scan, or ultrasound. How is this treated? At present, there is no medicine to treat COVID-19. Medicines that treat other diseases are being used on a trial basis to see if they are effective against COVID-19. Your health care provider will talk with you about ways to treat your symptoms. For most people, the infection is mild and can be managed at home with rest, fluids, and over-the-counter medicines. Treatment for a serious infection usually takes places in a hospital intensive care unit (ICU). It may include one or more of the following treatments. These treatments are given until your symptoms improve.  Receiving fluids and medicines through an IV.  Supplemental oxygen. Extra oxygen is given through a tube in the nose, a face mask, or a hood.  Positioning you to lie on your stomach (prone position). This makes it easier for oxygen to get into the lungs.  Continuous positive airway pressure (CPAP) or bi-level positive airway pressure (BPAP) machine. This treatment uses mild air pressure to keep the airways open. A tube that is connected to a motor delivers oxygen to the body.  Ventilator. This treatment moves air into and out of the lungs by using a tube that is placed in your windpipe.  Tracheostomy. This is a procedure to create a hole in the neck so that a breathing tube can be inserted.  Extracorporeal membrane oxygenation (ECMO). This procedure gives the lungs a chance to recover by taking over the functions of the heart and lungs. It supplies oxygen to the body and removes carbon dioxide. Follow these instructions at home: Lifestyle  If you are sick, stay home except to get medical care. Your health care provider will tell you how long to stay home. Call your health  care provider before you go for medical care.  Rest at home as told by your health care provider.  Do not use any products that contain nicotine or tobacco, such as cigarettes, e-cigarettes, and chewing tobacco. If you need help quitting, ask your health care provider.  Return to your normal activities as told by your health care provider. Ask your health care provider what activities are safe for you. General instructions  Take over-the-counter and prescription medicines only as told by your health care provider.  Drink enough fluid to keep your urine pale yellow.  Keep all follow-up visits as told by your health care provider. This is important. How is this prevented?  There is no vaccine to help prevent COVID-19 infection. However, there are steps you can take to protect yourself and others from this virus. To protect yourself:   Do not travel to areas where COVID-19 is a risk. The areas where COVID-19 is reported change often. To identify high-risk areas and travel restrictions, check the CDC travel website: FatFares.com.br  If you live in, or must travel to, an area where COVID-19  is a risk, take precautions to avoid infection. ? Stay away from people who are sick. ? Wash your hands often with soap and water for 20 seconds. If soap and water are not available, use an alcohol-based hand sanitizer. ? Avoid touching your mouth, face, eyes, or nose. ? Avoid going out in public, follow guidance from your state and local health authorities. ? If you must go out in public, wear a cloth face covering or face mask. Make sure your mask covers your nose and mouth. ? Avoid crowded indoor spaces. Stay at least 6 feet (2 meters) away from others. ? Disinfect objects and surfaces that are frequently touched every day. This may include:  Counters and tables.  Doorknobs and light switches.  Sinks and faucets.  Electronics, such as phones, remote controls, keyboards, computers,  and tablets. To protect others: If you have symptoms of COVID-19, take steps to prevent the virus from spreading to others.  If you think you have a COVID-19 infection, contact your health care provider right away. Tell your health care team that you think you may have a COVID-19 infection.  Stay home. Leave your house only to seek medical care. Do not use public transport.  Do not travel while you are sick.  Wash your hands often with soap and water for 20 seconds. If soap and water are not available, use alcohol-based hand sanitizer.  Stay away from other members of your household. Let healthy household members care for children and pets, if possible. If you have to care for children or pets, wash your hands often and wear a mask. If possible, stay in your own room, separate from others. Use a different bathroom.  Make sure that all people in your household wash their hands well and often.  Cough or sneeze into a tissue or your sleeve or elbow. Do not cough or sneeze into your hand or into the air.  Wear a cloth face covering or face mask. Make sure your mask covers your nose and mouth. Where to find more information  Centers for Disease Control and Prevention: PurpleGadgets.be  World Health Organization: https://www.castaneda.info/ Contact a health care provider if:  You live in or have traveled to an area where COVID-19 is a risk and you have symptoms of the infection.  You have had contact with someone who has COVID-19 and you have symptoms of the infection. Get help right away if:  You have trouble breathing.  You have pain or pressure in your chest.  You have confusion.  You have bluish lips and fingernails.  You have difficulty waking from sleep.  You have symptoms that get worse. These symptoms may represent a serious problem that is an emergency. Do not wait to see if the symptoms will go away. Get medical help right away. Call  your local emergency services (911 in the [place] Do not drive yourself to the hospital. Let the emergency medical personnel know if you think you have COVID-19. Summary  COVID-19 is a respiratory infection that is caused by a virus. It is also known as coronavirus disease or novel coronavirus. It can cause serious infections, such as pneumonia, acute respiratory distress syndrome, acute respiratory failure, or sepsis.  The virus that causes COVID-19 is contagious. This means that it can spread from person to person through droplets from breathing, speaking, singing, coughing, or sneezing.  You are more likely to develop a serious illness if you are 1 years of age or older, have a weak immune  system, live in a nursing home, or have chronic disease.  There is no medicine to treat COVID-19. Your health care provider will talk with you about ways to treat your symptoms.  Take steps to protect yourself and others from infection. Wash your hands often and disinfect objects and surfaces that are frequently touched every day. Stay away from people who are sick and wear a mask if you are sick. This information is not intended to replace advice given to you by your health care provider. Make sure you discuss any questions you have with your health care provider. Document Revised: 04/25/2019 Document Reviewed: 08/01/2018 Elsevier Patient Education  [place].

## 2019-10-01 NOTE — Progress Notes (Signed)
Patient ID: Leslie Knapp, female    DOB: 04-07-1951, 69 y.o.   MRN: [id number]  PCP: Brunetta Jeans, PA-C  Chief Complaint  Patient presents with  . COVID 19    Subjective:  HPI Leslie Knapp is a 69 y.o. female presents to [facility] for evaluation of symptoms related to COVID-19 infection.  Diagnosed with COVID-19 on 09/19/2019.  Patient is status post antibiotic infusion on 09/29/2019.  Patient last seen in clinic on 09/24/2019 with symptoms consistent with direct infection due to COVID-19.  At that time her chest x-ray was negative and blood work reassuring.  During that visit patient complained of worsening fatigue with intermittent shortness of breath.  Today she presents after being referred by her primary care provider for evaluation of worsening of breath, chest tightness, fatigue, and generalized weakness.  On arrival her blood pressure is soft at 100/70.  She complains of pallor of her skin.  She endorses frequent urination although is attempting to drink as much as possible.  She is unable to eat and has no appetite.  Her fevers have improved although she continues to have a steady low-grade temp above 99.1 although no temperatures greater than 100 F since antibody infusion.   He continues to check her oxygen level at home and oxygen level has remained normal.  Patient is high risk for complication associated with COVID-19 due to history of hypercoagulability-IVC Filter in place, morbid obesity, and  atherosclerosis.  Review of Systems Pertinent negatives listed in HPI  Patient Active Problem List   Diagnosis Date Noted  . 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 Aortic atherosclerosis ([place]) 03/17/2019  . 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 Abdominal pain 03/08/2019  . Pure hypercholesterolemia 09/25/2017  . Morbid obesity ([place]) 06/07/2017  . Encounter for Medicare annual  wellness exam 06/07/2017  . Vitamin D deficiency 06/07/2017  . IBS (irritable bowel syndrome) 08/29/2016  . Migraine 08/29/2016  . Disorder of musculoskeletal system 03/23/2015  . Eye exam abnormal 03/23/2015  . NASH (nonalcoholic steatohepatitis) 10/19/2011      Prior to Admission medications   Medication Sig Start Date End Date Taking? Authorizing Provider  apixaban (ELIQUIS) 5 MG TABS tablet Take 1 tablet (5 mg total) by mouth 2 (two) times daily. 08/11/19  Yes [place], Holli Humbles, NP  benzonatate (TESSALON) 100 MG capsule Take 1 capsule (100 mg total) by mouth 3 (three) times daily as needed for cough. 09/24/19  Yes Brunetta Jeans, PA-C  Bisacodyl (GENTLE LAXATIVE PO) Take by mouth.   Yes [provider]  Cholecalciferol (VITAMIN D-3) 1000 UNITS CAPS Take 1 each by mouth daily.   Yes [provider]  cyclobenzaprine (FLEXERIL) 10 MG tablet Take 1 tablet (10 mg total) by mouth at bedtime. 09/24/19  Yes Scot Jun, FNP  Digestive Enzymes (ENZYME DIGEST) CAPS Take by mouth.   Yes [provider]  NON FORMULARY ROLFING: Deep Tissue Massage, 2 Txs weekly as needed for joint pain   Yes [provider]  Probiotic Product (PROBIOTIC-10 PO) Take 1 tablet by mouth daily.   Yes [provider]  Trace Min [id number] (MINERALS PO) Take 1 oz by mouth daily.   Yes [provider]    Past Medical, Surgical Family and Social History reviewed and updated.    Objective:   Today's Vitals   10/01/19 1855  BP: 100/70  Pulse: 92  Temp: 99.1  F (37.3 C)  TempSrc: Oral  SpO2: 95%    Wt Readings from Last 3 Encounters:  09/24/19 (!) 311 lb (141.1 kg)  08/11/19 (!) 323 lb 1.9 oz (146.6 kg)  06/23/19 (!) 322 lb (146.1 kg)     Physical Exam Constitutional:      Appearance: She is ill-appearing.  HENT:     Mouth/Throat:     Mouth: Mucous membranes are dry.  Cardiovascular:     Rate and Rhythm: Normal rate and regular  rhythm.     Heart sounds: Normal heart sounds.  Pulmonary:     Breath sounds: Decreased air movement present. Decreased breath sounds present.  Musculoskeletal:     Cervical back: Normal range of motion.  Skin:    Coloration: Skin is pale.  Neurological:     General: No focal deficit present.     Mental Status: She is oriented to person, place, and time.      Assessment & Plan:  COVID-19 virus infection - Plan: DG Chest Portable 2 Views  Hypercoagulable state, secondary ([place])  Shortness of breath  Hypotension, unspecified hypotension type  Pneumonia due to COVID-19 virus   DG Chest Portable 2 Views  Result Date: 10/01/2019 CLINICAL DATA:  69 year old female with cough and shortness of breath. EXAM: CHEST  2 VIEW PORTABLE COMPARISON:  Chest radiograph dated 09/24/2019. FINDINGS: There is mild diffuse chronic interstitial coarsening. Faint areas of interstitial and subpleural densities primarily involving the upper lobes concerning for developing infiltrate, possibly viral or atypical in etiology. Clinical correlation and follow-up recommended. No lobar consolidation, pleural effusion or pneumothorax. The cardiac silhouette is within normal limits. An Amplatzer occlusive device noted. Atherosclerotic calcification of the aorta. No acute osseous pathology. IMPRESSION: Bilateral upper lobe interstitial and subpleural densities concerning for developing infiltrate. Electronically Signed   By: Anner Crete M.D.   On: 10/01/2019 18:54   DG Chest Portable 2 Views  Result Date: 09/24/2019 CLINICAL DATA:  Shortness of breath, COVID positive EXAM: CHEST  2 VIEW PORTABLE COMPARISON:  03/14/2019 FINDINGS: The heart size and mediastinal contours are within normal limits. Both lungs are clear. The visualized skeletal structures are unremarkable. IMPRESSION: No active cardiopulmonary disease. Electronically Signed   By: Donavan Foil M.D.   On: 09/24/2019 18:44   -Deferred to ER for treatment  and evaluation.  Patient has a history of hypercoagulable state with PE, Atrial Aneurysm, and DVT therefore advised to  follow-up at the ER for CTA and possible IV fluid rehydration give generalized weakness and pallor.  Patient declined EMS.  Patient prefers to follow-up at [facility] in [place] closer to her home.  Patient was placed in the car and with her sister who is driving and sister verbalized understanding.  Patient is to follow-up at the ER.     -The patient was given clear instructions to go to ER or return to medical center if symptoms do not improve, worsen or new problems develop. The patient verbalized understanding.     Molli Barrows, FNP-C [facility], PRN Provider  [facility] [place], [place] [facility] Phone: [phone] Clinic Fax: [phone] Clinic Hours: 5:30 pm -7:30 pm (Monday-Friday)

## 2019-10-02 ENCOUNTER — Telehealth: Payer: Self-pay | Admitting: Physician Assistant

## 2019-10-02 NOTE — Telephone Encounter (Signed)
Sister is aware and expressed understanding of Patina's and Dr. Virgil Benedict instructions.

## 2019-10-02 NOTE — Telephone Encounter (Signed)
Please advise 

## 2019-10-02 NOTE — Telephone Encounter (Signed)
[facility] note from yesterday where she was given clear instructions multiple times to go to ER.  They wanted to call EMS from the office b/c she had low blood pressure, was pale, and at high risk for clot.  Given that she already has COVID, there is no risk for 'increased exposure' and she needs to go to ER ASAP for evaluation

## 2019-10-02 NOTE — Telephone Encounter (Signed)
Please review the notes from office visit yesterday. Pt didn't go to the ER but did call EMS and per EMS didn't want to put her at risk of more espouser. Her sister wants her to have a CT states that per the provider she seen yesterday they told her to have one done asap to rule out a blood clot. Please call sister on cell phone #

## 2019-10-02 NOTE — Telephone Encounter (Signed)
Left a detailed message on patient sister cell phone advising that patient needs to go to the ER for evaluation. She is not at increased risk for COVID since she has it already. At the ER she can get the test she needs to be completed right away.

## 2019-10-03 ENCOUNTER — Encounter (HOSPITAL_BASED_OUTPATIENT_CLINIC_OR_DEPARTMENT_OTHER): Payer: Self-pay | Admitting: *Deleted

## 2019-10-03 ENCOUNTER — Other Ambulatory Visit: Payer: Self-pay

## 2019-10-03 ENCOUNTER — Emergency Department (HOSPITAL_BASED_OUTPATIENT_CLINIC_OR_DEPARTMENT_OTHER)
Admission: EM | Admit: 2019-10-03 | Discharge: 2019-10-03 | Disposition: A | Discharge status: Home / Self Care | Payer: Medicare Other | Attending: Emergency Medicine | Admitting: Emergency Medicine

## 2019-10-03 ENCOUNTER — Emergency Department (HOSPITAL_BASED_OUTPATIENT_CLINIC_OR_DEPARTMENT_OTHER): Payer: Medicare Other

## 2019-10-03 DIAGNOSIS — E041 Nontoxic single thyroid nodule: Secondary | ICD-10-CM | POA: Insufficient documentation

## 2019-10-03 DIAGNOSIS — R509 Fever, unspecified: Secondary | ICD-10-CM | POA: Diagnosis present

## 2019-10-03 DIAGNOSIS — Z86711 Personal history of pulmonary embolism: Secondary | ICD-10-CM | POA: Insufficient documentation

## 2019-10-03 DIAGNOSIS — Z79899 Other long term (current) drug therapy: Secondary | ICD-10-CM | POA: Diagnosis not present

## 2019-10-03 DIAGNOSIS — R5383 Other fatigue: Secondary | ICD-10-CM | POA: Diagnosis not present

## 2019-10-03 DIAGNOSIS — R531 Weakness: Secondary | ICD-10-CM | POA: Diagnosis not present

## 2019-10-03 DIAGNOSIS — J1282 Pneumonia due to coronavirus disease 2019: Secondary | ICD-10-CM | POA: Insufficient documentation

## 2019-10-03 DIAGNOSIS — Z7901 Long term (current) use of anticoagulants: Secondary | ICD-10-CM | POA: Insufficient documentation

## 2019-10-03 DIAGNOSIS — U071 COVID-19: Secondary | ICD-10-CM | POA: Diagnosis not present

## 2019-10-03 DIAGNOSIS — R0602 Shortness of breath: Secondary | ICD-10-CM | POA: Diagnosis not present

## 2019-10-03 LAB — POCT I-STAT 7, (LYTES, BLD GAS, ICA,H+H)
Acid-Base Excess: 2 mmol/L (ref 0.0–2.0)
Bicarbonate: 25.1 mmol/L (ref 20.0–28.0)
Calcium, Ion: 1.25 mmol/L (ref 1.15–1.40)
HCT: 43 % (ref 36.0–46.0)
Hemoglobin: 14.6 g/dL (ref 12.0–15.0)
O2 Saturation: 96 %
Patient temperature: 98.1
Potassium: 4.2 mmol/L (ref 3.5–5.1)
Sodium: 139 mmol/L (ref 135–145)
TCO2: 26 mmol/L (ref 22–32)
pCO2 arterial: 35.1 mmHg (ref 32.0–48.0)
pH, Arterial: 7.461 — ABNORMAL HIGH (ref 7.350–7.450)
pO2, Arterial: 73 mmHg — ABNORMAL LOW (ref 83.0–108.0)

## 2019-10-03 LAB — BASIC METABOLIC PANEL
Anion gap: 13 (ref 5–15)
BUN: 8 mg/dL (ref 8–23)
CO2: 25 mmol/L (ref 22–32)
Calcium: 9.2 mg/dL (ref 8.9–10.3)
Chloride: 103 mmol/L (ref 98–111)
Creatinine, Ser: 0.69 mg/dL (ref 0.44–1.00)
GFR calc Af Amer: >60 mL/min (ref >60)
GFR calc non Af Amer: >60 mL/min (ref >60)
Glucose, Bld: 105 mg/dL — ABNORMAL HIGH (ref 70–99)
Potassium: 4.4 mmol/L (ref 3.5–5.1)
Sodium: 141 mmol/L (ref 135–145)

## 2019-10-03 LAB — CBC
HCT: 47.8 % — ABNORMAL HIGH (ref 36.0–46.0)
Hemoglobin: 15.6 g/dL — ABNORMAL HIGH (ref 12.0–15.0)
MCH: 28.6 pg (ref 26.0–34.0)
MCHC: 32.6 g/dL (ref 30.0–36.0)
MCV: 87.7 fL (ref 80.0–100.0)
Platelets: 269 10*3/uL (ref 150–400)
RBC: 5.45 MIL/uL — ABNORMAL HIGH (ref 3.87–5.11)
RDW: 14.2 % (ref 11.5–15.5)
WBC: 7.1 10*3/uL (ref 4.0–10.5)
nRBC: 0 % (ref 0.0–0.2)

## 2019-10-03 MED ORDER — IOHEXOL 350 MG/ML SOLN
100.0000 mL | Freq: Once | INTRAVENOUS | Status: AC | PRN
  Administered 2019-10-03: 100 mL via INTRAVENOUS

## 2019-10-03 NOTE — Discharge Instructions (Addendum)
Continue to take tylenol as needed for fever.  Monitor your oxygen level and return to a hospital for oxygen levels below 90

## 2019-10-03 NOTE — ED Triage Notes (Signed)
Covid + on the 12th of March.  Chest rxay done on the 17th, it was clear. On the 22nd, antibdoy infusion was given and a follow-up chest xray was done it was cloudy and wants to clarify if it is pneumonia and PE or something else.

## 2019-10-03 NOTE — ED Notes (Signed)
O2 sat 94-96% on RA while ambulating

## 2019-10-03 NOTE — ED Provider Notes (Signed)
[facility] EMERGENCY DEPARTMENT Provider Note   CSN: [id number] Arrival date & time: 10/03/19  [id number]     History Chief Complaint  Patient presents with  . Covid +    Leslie Knapp is a 69 y.o. female.  HPI   Patient presents to the ED for evaluation of worsening symptoms associated with Covid.  Patient patient was diagnosed with Covid on March 12.  Patient has been having trouble with intermittent fevers.  Initially she only had a mild cough but that has progressed.  Patient has had increasing fatigue as well as weakness.  Patient initially was treated with a course of azithromycin.  Patient has a history of pulmonary embolism and is on anticoagulation and also has an IVC filter.  Patient did receive monoclonal antibody infusion on March 22.  Patient symptoms have continued to progress.  She continues to feel very fatigued.  She has been coughing.  She also is very thirsty and has been drinking a lot.  She went to the office on March 24.  Patient was evaluated there and they were concerned about her worsening symptoms.  They were concerned about possible pulmonary embolism.  She was instructed to go to the emergency room on the 24th.  Patient apparently did not seek evaluation then but has come to the ED for evaluation today  Past Medical History:  Diagnosis Date  . CTS (carpal tunnel syndrome)   . Fatty liver disease, nonalcoholic    In [facility] Study  . IBS (irritable bowel syndrome)    C/D  . Morbid obesity ([place])   . Osteoarthritis     Patient Active Problem List   Diagnosis Date Noted  . 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 Aortic atherosclerosis ([place]) 03/17/2019  . 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 Abdominal pain 03/08/2019  . Pure hypercholesterolemia 09/25/2017  . Morbid obesity ([place]) 06/07/2017  . Encounter for Medicare annual wellness exam 06/07/2017  . Vitamin D  deficiency 06/07/2017  . IBS (irritable bowel syndrome) 08/29/2016  . Migraine 08/29/2016  . Disorder of musculoskeletal system 03/23/2015  . Eye exam abnormal 03/23/2015  . NASH (nonalcoholic steatohepatitis) 10/19/2011    Past Surgical History:  Procedure Laterality Date  . KNEE SURGERY  1975   Left Patella Tendon Replacement  . TONSILLECTOMY  1963  . 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 TOTAL KNEE ARTHROPLASTY  11.2005   Right  . UTERINE POLYP REMOVAL  2010     OB History   No obstetric history on file.     Family History  Problem Relation Age of Onset  . Stroke Mother 39       Deceased  . Hypertension Mother   . GI problems Mother   . Arthritis Mother   . Dementia Mother   . Heart defect Father 70       Deceased  . Parkinson's disease Father   . Varicose Veins Father   . Stroke Maternal Grandfather   . Arthritis Maternal Grandfather   . Kidney failure Maternal Grandmother   . Gallbladder disease Maternal Grandmother   . Diverticulitis Sister        #1  . Arthritis Sister   . Healthy Sister        #2    Social History   Tobacco Use  . Smoking status: Never Smoker  . Smokeless tobacco: Never Used  Substance Use Topics  .  Alcohol use: No    Alcohol/week: 0.0 standard drinks  . Drug use: No    Home Medications Prior to Admission medications   Medication Sig Start Date End Date Taking? Authorizing Provider  apixaban (ELIQUIS) 5 MG TABS tablet Take 1 tablet (5 mg total) by mouth 2 (two) times daily. 08/11/19   [place], Holli Humbles, NP  benzonatate (TESSALON) 100 MG capsule Take 1 capsule (100 mg total) by mouth 3 (three) times daily as needed for cough. 09/24/19   Brunetta Jeans, PA-C  Bisacodyl (GENTLE LAXATIVE PO) Take by mouth.    [provider]  Cholecalciferol (VITAMIN D-3) 1000 UNITS CAPS Take 1 each by mouth daily.    [provider]  cyclobenzaprine (FLEXERIL) 10 MG tablet Take 1 tablet (10 mg total) by mouth at bedtime.  09/24/19   Scot Jun, FNP  Digestive Enzymes (ENZYME DIGEST) CAPS Take by mouth.    [provider]  NON FORMULARY ROLFING: Deep Tissue Massage, 2 Txs weekly as needed for joint pain    [provider]  Probiotic Product (PROBIOTIC-10 PO) Take 1 tablet by mouth daily.    [provider]  Trace Min CaCrCuFeKMgMnPSeZn (MINERALS PO) Take 1 oz by mouth daily.    [provider]    Allergies    Celebrex [celecoxib] and Lactose intolerance (gi)  Review of Systems   Review of Systems  All other systems reviewed and are negative.   Physical Exam Updated Vital Signs BP (!) 128/100 (BP Location: Right Arm)   Pulse 78   Temp 98.1 F (36.7 C) (Oral)   Resp 18   Ht 1.778 m (5\' 10" )   Wt (!) 137.9 kg   SpO2 95%   BMI 43.62 kg/m   Physical Exam Vitals and nursing note reviewed.  Constitutional:      Appearance: She is well-developed. She is obese.     Comments: Listless  HENT:     Head: Normocephalic and atraumatic.     Right Ear: External ear normal.     Left Ear: External ear normal.  Eyes:     General: No scleral icterus.       Right eye: No discharge.        Left eye: No discharge.     Conjunctiva/sclera: Conjunctivae normal.  Neck:     Trachea: No tracheal deviation.  Cardiovascular:     Rate and Rhythm: Normal rate and regular rhythm.  Pulmonary:     Effort: Pulmonary effort is normal. No respiratory distress.     Breath sounds: Normal breath sounds. No stridor. No wheezing or rales.  Abdominal:     General: Bowel sounds are normal. There is no distension.     Palpations: Abdomen is soft.     Tenderness: There is no abdominal tenderness. There is no guarding or rebound.  Musculoskeletal:        General: No tenderness.     Cervical back: Neck supple.  Skin:    General: Skin is warm and dry.     Findings: No rash.  Neurological:     Mental Status: She is alert.     Cranial Nerves: No cranial nerve deficit (no facial droop,  extraocular movements intact, no slurred speech).     Sensory: No sensory deficit.     Motor: No abnormal muscle tone or seizure activity.     Coordination: Coordination normal.     ED Results / Procedures / Treatments   Labs (all labs ordered are  listed, but only abnormal results are displayed) Labs Reviewed  CBC - Abnormal; Notable for the following components:      Result Value   RBC 5.45 (*)    Hemoglobin 15.6 (*)    HCT 47.8 (*)    All other components within normal limits  BASIC METABOLIC PANEL - Abnormal; Notable for the following components:   Glucose, Bld 105 (*)    All other components within normal limits  POCT I-STAT 7, (LYTES, BLD GAS, ICA,H+H) - Abnormal; Notable for the following components:   pH, Arterial 7.461 (*)    pO2, Arterial 73.0 (*)    All other components within normal limits  I-STAT ARTERIAL BLOOD GAS, ED    EKG None  Radiology CT Angio Chest PE W and/or Wo Contrast  Result Date: 10/03/2019 CLINICAL DATA:  Shortness of breath. EXAM: CT ANGIOGRAPHY CHEST WITH CONTRAST TECHNIQUE: Multidetector CT imaging of the chest was performed using the standard protocol during bolus administration of intravenous contrast. Multiplanar CT image reconstructions and MIPs were obtained to evaluate the vascular anatomy. CONTRAST:  111mL OMNIPAQUE IOHEXOL 350 MG/ML SOLN COMPARISON:  June 23, 2019. FINDINGS: Cardiovascular: Satisfactory opacification of the pulmonary arteries to the segmental level. No evidence of pulmonary embolism. Normal heart size. No pericardial effusion. Mediastinum/Nodes: 1.7 cm right thyroid nodule is noted. The esophagus is unremarkable. 1.6 cm right hilar lymph node is noted which most likely is inflammatory in etiology. Lungs/Pleura: No pneumothorax or pleural effusion is noted. Interval development of multiple ill-defined opacities throughout both lungs most consistent with multifocal pneumonia, potentially of viral etiology. Upper Abdomen: No  acute abnormality. Musculoskeletal: No chest wall abnormality. No acute or significant osseous findings. Review of the MIP images confirms the above findings. IMPRESSION: 1. No definite evidence of pulmonary embolus. 2. Interval development of multiple ill-defined opacities throughout both lungs most consistent with multifocal pneumonia, potentially of viral etiology. 3. 1.6 cm right hilar lymph node is noted which most likely is inflammatory in etiology. 4. 1.7 cm right thyroid nodule is noted. Recommend thyroid US. (Ref: J Am Coll Radiol. 2015 Feb;12(2): 143-50). Electronically Signed   By: Marijo Conception M.D.   On: 10/03/2019 09:56   DG Chest Portable 2 Views  Result Date: 10/01/2019 CLINICAL DATA:  69 year old female with cough and shortness of breath. EXAM: CHEST  2 VIEW PORTABLE COMPARISON:  Chest radiograph dated 09/24/2019. FINDINGS: There is mild diffuse chronic interstitial coarsening. Faint areas of interstitial and subpleural densities primarily involving the upper lobes concerning for developing infiltrate, possibly viral or atypical in etiology. Clinical correlation and follow-up recommended. No lobar consolidation, pleural effusion or pneumothorax. The cardiac silhouette is within normal limits. An Amplatzer occlusive device noted. Atherosclerotic calcification of the aorta. No acute osseous pathology. IMPRESSION: Bilateral upper lobe interstitial and subpleural densities concerning for developing infiltrate. Electronically Signed   By: Anner Crete M.D.   On: 10/01/2019 18:54    Procedures Procedures (including critical care time)  Medications Ordered in ED Medications  iohexol (OMNIPAQUE) 350 MG/ML injection 100 mL (100 mLs Intravenous Contrast Given 10/03/19 0936)    ED Course  I have reviewed the triage vital signs and the nursing notes.  Pertinent labs & imaging results that were available during my care of the patient were reviewed by me and considered in my medical  decision making (see chart for details).  Clinical Course as of Oct 03 1135  Fri Oct 03, 2019  1031 Patient's lab tests are reassuring.   [id number]  45 CT scan does demonstrate infiltrates consistent with Covid pneumonia.   [JK]  1038 CT Angio Chest PE W and/or Wo Contrast [JK]    Clinical Course User Index [JK] Dorie Rank, MD   MDM Rules/Calculators/A&P                     Patient presented to the ED for evaluation of persistent shortness of breath associated with known Covid diagnosis.  Patient was sent to have a CT scan with concerns of possible pulmonary embolism.  ED work-up fortunately is reassuring.  Patient has evidence of pulmonary infiltrates consistent with her viral pneumonia but no evidence of pulmonary embolism.  Patient's vital signs are reassuring.  Room air oxygen saturation has remained in the mid to high 90s and also remained in the mid 90s when she was ambulating on room air.  She is not having any fevers anymore and does seem to be slowly improving from her Covid infection.  She does have Covid pneumonia but no oxygen requirements therefore I do not feel she requires admission at this time.  Patient instructed to monitor her oxygen level at home, she does have a pulse oximeter.  Instructed to return to the hospital for saturations below 90% Final Clinical Impression(s) / ED Diagnoses Final diagnoses:  Pneumonia due to COVID-19 virus  Thyroid nodule    Rx / DC Orders ED Discharge Orders    None       Dorie Rank, MD 10/03/19 1139

## 2019-10-03 NOTE — ED Notes (Signed)
ED Provider at bedside. 

## 2019-10-03 NOTE — ED Notes (Signed)
Pt returned from CT °

## 2019-10-08 ENCOUNTER — Telehealth: Payer: Self-pay | Admitting: Emergency Medicine

## 2019-10-08 DIAGNOSIS — J1282 Pneumonia due to coronavirus disease 2019: Secondary | ICD-10-CM

## 2019-10-08 DIAGNOSIS — E041 Nontoxic single thyroid nodule: Secondary | ICD-10-CM

## 2019-10-08 NOTE — Telephone Encounter (Signed)
-----   Message from Brunetta Jeans, PA-C sent at 10/06/2019  6:36 AM EDT ----- Recent ER visit -- thyroid nodule noted. Needs dedicated Thyroid US to further assess this nodule and to make sure no other nodules present. Ok to place order and inform patient.

## 2019-10-16 NOTE — Telephone Encounter (Signed)
Typically we say a pneumonia is resolved as most symptoms dissipate. I would say she is at that point. The cough can linger for a few weeks after infection has resolved until the airways have healed. Usually if we get a CXR we wait about 3-4 weeks after treatment to ensure resolution (it takes a few weeks for radiographic changes to clear).   If symptoms are not continuing to improve over the weekend I would say we can consider CXR. Otherwise will just proceed with thyroid imaging.

## 2019-10-16 NOTE — Telephone Encounter (Signed)
Called patient to advise of thyroid nodule seen on imaging. She was not aware of the thyroid nodule. She was just aware of the pneumonia. She is feeling about 80% better. She is still coughing but non productive. No sob or wheezing. Her O2 96-98 % She was advised by the health department to not get another COVID test it could test positive for up to 3 months.  She is wanting to know how she will know when the pneumonia is better? Repeat CXR? When to get? Can she get at the same time with Thyroid US at the [facility]

## 2019-10-16 NOTE — Addendum Note (Signed)
Addended by: Leonidas Romberg on: 10/16/2019 04:26 PM   Modules accepted: Orders

## 2019-10-17 NOTE — Telephone Encounter (Signed)
Patient advised of PCP recommendations. She is agreeable with recheck of CXR in 4 weeks. And will proceed with thyroid ultrasound. She continues to improve

## 2019-10-17 NOTE — Addendum Note (Signed)
Addended by: Leonidas Romberg on: 10/17/2019 01:15 PM   Modules accepted: Orders

## 2019-10-23 ENCOUNTER — Other Ambulatory Visit (HOSPITAL_BASED_OUTPATIENT_CLINIC_OR_DEPARTMENT_OTHER): Payer: Medicare Other

## 2019-10-23 ENCOUNTER — Other Ambulatory Visit: Payer: Self-pay

## 2019-10-23 ENCOUNTER — Ambulatory Visit (HOSPITAL_BASED_OUTPATIENT_CLINIC_OR_DEPARTMENT_OTHER)
Admission: RE | Admit: 2019-10-23 | Discharge: 2019-10-23 | Disposition: A | Discharge status: Home / Self Care | Payer: Medicare Other | Source: Ambulatory Visit | Attending: Physician Assistant | Admitting: Physician Assistant

## 2019-10-23 DIAGNOSIS — E041 Nontoxic single thyroid nodule: Secondary | ICD-10-CM

## 2019-10-27 ENCOUNTER — Other Ambulatory Visit: Payer: Self-pay

## 2019-10-27 ENCOUNTER — Telehealth: Payer: Self-pay

## 2019-10-27 DIAGNOSIS — E041 Nontoxic single thyroid nodule: Secondary | ICD-10-CM

## 2019-10-27 NOTE — Telephone Encounter (Signed)
I am fine with that.  I have discussed with her that x-ray was not necessary as long as symptoms were improving/resolving.  I believe they were more adamant on her having repeat imaging.  I am fine with canceling order for x-ray as long as her symptoms continue to resolve.

## 2019-10-27 NOTE — Telephone Encounter (Signed)
FYI: Patient states she is going to wait until the end of next week to get the confirmatory x-ray. Patient states she is only coughing a little and breathing easier. She states she knows she is getting better. May 14, patient states she was retested for covid with the nasal swab and she tested negative. Patient was just concerned with radiation and all the imaging she has been having.

## 2019-10-27 NOTE — Telephone Encounter (Signed)
Called patient and informed of provider recommendations. Patient voiced understanding and will hold off on canceling the x-ray until next week to be sure.

## 2019-10-28 ENCOUNTER — Telehealth: Payer: Self-pay | Admitting: Physician Assistant

## 2019-10-28 NOTE — Telephone Encounter (Signed)
Left message for patient to schedule Annual Wellness Visit.  Please schedule with Nurse Health Advisor Victoria Britt, RN at [facility]  

## 2019-10-31 DIAGNOSIS — H524 Presbyopia: Secondary | ICD-10-CM | POA: Diagnosis not present

## 2019-10-31 DIAGNOSIS — H5319 Other subjective visual disturbances: Secondary | ICD-10-CM | POA: Diagnosis not present

## 2019-10-31 DIAGNOSIS — G43809 Other migraine, not intractable, without status migrainosus: Secondary | ICD-10-CM | POA: Diagnosis not present

## 2019-10-31 DIAGNOSIS — H04123 Dry eye syndrome of bilateral lacrimal glands: Secondary | ICD-10-CM | POA: Diagnosis not present

## 2019-10-31 DIAGNOSIS — H2513 Age-related nuclear cataract, bilateral: Secondary | ICD-10-CM | POA: Diagnosis not present

## 2019-11-04 ENCOUNTER — Ambulatory Visit
Admission: RE | Admit: 2019-11-04 | Discharge: 2019-11-04 | Disposition: A | Discharge status: Home / Self Care | Payer: Medicare Other | Source: Ambulatory Visit | Attending: Physician Assistant | Admitting: Physician Assistant

## 2019-11-04 ENCOUNTER — Other Ambulatory Visit (HOSPITAL_COMMUNITY)
Admission: RE | Admit: 2019-11-04 | Discharge: 2019-11-04 | Disposition: A | Discharge status: Home / Self Care | Payer: Medicare Other | Source: Ambulatory Visit | Attending: Physician Assistant | Admitting: Physician Assistant

## 2019-11-04 DIAGNOSIS — E041 Nontoxic single thyroid nodule: Secondary | ICD-10-CM | POA: Insufficient documentation

## 2019-11-06 LAB — CYTOLOGY - NON PAP

## 2019-11-07 ENCOUNTER — Other Ambulatory Visit: Payer: Self-pay | Admitting: Physician Assistant

## 2019-11-07 DIAGNOSIS — C73 Malignant neoplasm of thyroid gland: Secondary | ICD-10-CM

## 2019-11-14 ENCOUNTER — Encounter (INDEPENDENT_AMBULATORY_CARE_PROVIDER_SITE_OTHER): Payer: Self-pay | Admitting: Otolaryngology

## 2019-11-14 ENCOUNTER — Ambulatory Visit (INDEPENDENT_AMBULATORY_CARE_PROVIDER_SITE_OTHER): Payer: Medicare Other | Admitting: Otolaryngology

## 2019-11-14 ENCOUNTER — Other Ambulatory Visit: Payer: Self-pay

## 2019-11-14 VITALS — Temp 98.4°F

## 2019-11-14 DIAGNOSIS — I749 Embolism and thrombosis of unspecified artery: Secondary | ICD-10-CM | POA: Diagnosis not present

## 2019-11-14 DIAGNOSIS — D497 Neoplasm of unspecified behavior of endocrine glands and other parts of nervous system: Secondary | ICD-10-CM

## 2019-11-14 NOTE — Progress Notes (Signed)
HPI: Leslie Knapp is a 69 y.o. female who presents is referred by her PCP for evaluation of right thyroid nodule that she underwent fine-needle aspirate and this demonstrated Hurthle cell neoplasm.  Apparently patient has had a right thyroid nodule for several months as she underwent a CT scan last year then had another CT scan of her chest this year following hospitalization for Covid.  She was noted to have a right thyroid nodule and underwent ultrasound-guided fine-needle aspirate.  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  Molecular testing on the fine-needle aspirate is still pending.  She states that she has had some intermittent hoarseness but her voice is doing well today. Patient's health history is complicated by a PE last September that resulted in several procedures including percutaneous closure of a patency in her heart.  She has done well since then but is on Eliquis and is followed by Dr. Marin Olp.  Past Medical History:  Diagnosis Date  . CTS (carpal tunnel syndrome)   . Fatty liver disease, nonalcoholic    In [facility] Study  . IBS (irritable bowel syndrome)    C/D  . Morbid obesity ([place])   . Osteoarthritis    Past Surgical History:  Procedure Laterality Date  . KNEE SURGERY  1975   Left Patella Tendon Replacement  . TONSILLECTOMY  1963  . 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 TOTAL KNEE ARTHROPLASTY  11.2005   Right  . UTERINE POLYP REMOVAL  2010   Social History   Socioeconomic History  . Marital status: Widowed    Spouse name: Not on file  . Number of children: Not on file  . Years of education: Not on file  . Highest education level: Not on file  Occupational History  . Not on file  Tobacco Use  . Smoking status: Never Smoker  . Smokeless tobacco: Never Used  Substance and Sexual Activity  . Alcohol use: No    Alcohol/week: 0.0 standard drinks  . Drug use: No  . Sexual activity: Never  Other Topics Concern  . Not on file   Social History Narrative  . Not on file   Social Determinants of Health   Financial Resource Strain:   . Difficulty of Paying Living Expenses:   Food Insecurity:   . Worried About Charity fundraiser in the Last Year:   . Arboriculturist in the Last Year:   Transportation Needs:   . Film/video editor (Medical):   Marland Kitchen Lack of Transportation (Non-Medical):   Physical Activity:   . Days of Exercise per Week:   . Minutes of Exercise per Session:   Stress:   . Feeling of Stress :   Social Connections:   . Frequency of Communication with Friends and Family:   . Frequency of Social Gatherings with Friends and Family:   . Attends Religious Services:   . Active Member of Clubs or Organizations:   . Attends Archivist Meetings:   Marland Kitchen Marital Status:    Family History  Problem Relation Age of Onset  . Stroke Mother 52       Deceased  . Hypertension Mother   . GI problems Mother   . Arthritis Mother   . Dementia Mother   . Heart defect Father 30       Deceased  . Parkinson's disease Father   . Varicose Veins Father   . Stroke Maternal Grandfather   . Arthritis Maternal Grandfather   .  Kidney failure Maternal Grandmother   . Gallbladder disease Maternal Grandmother   . Diverticulitis Sister        #1  . Arthritis Sister   . Healthy Sister        #2   Allergies  Allergen Reactions  . Celebrex [Celecoxib] Swelling and Rash  . Lactose Intolerance (Gi) Other (See Comments)   Prior to Admission medications   Medication Sig Start Date End Date Taking? Authorizing Provider  apixaban (ELIQUIS) 5 MG TABS tablet Take 1 tablet (5 mg total) by mouth 2 (two) times daily. 08/11/19  Yes [place], Holli Humbles, NP  Bisacodyl (GENTLE LAXATIVE PO) Take by mouth.   Yes [provider]  Cholecalciferol (VITAMIN D-3) 1000 UNITS CAPS Take 1 each by mouth daily.   Yes [provider]  Digestive Enzymes (ENZYME DIGEST) CAPS Take by mouth.   Yes [provider]   NON FORMULARY ROLFING: Deep Tissue Massage, 2 Txs weekly as needed for joint pain   Yes [provider]  Probiotic Product (PROBIOTIC-10 PO) Take 1 tablet by mouth daily.   Yes [provider]  Trace Min CaCrCuFeKMgMnPSeZn (MINERALS PO) Take 1 oz by mouth daily.   Yes [provider]  benzonatate (TESSALON) 100 MG capsule Take 1 capsule (100 mg total) by mouth 3 (three) times daily as needed for cough. Patient not taking: Reported on 11/14/2019 09/24/19   Brunetta Jeans, PA-C  cyclobenzaprine (FLEXERIL) 10 MG tablet Take 1 tablet (10 mg total) by mouth at bedtime. Patient not taking: Reported on 11/14/2019 09/24/19   Scot Jun, FNP     Positive ROS: Otherwise negative  All other systems have been reviewed and were otherwise negative with the exception of those mentioned in the HPI and as above.  Physical Exam: Constitutional: Alert, well-appearing, no acute distress Ears: External ears without lesions or tenderness. Ear canals are clear bilaterally with intact, clear TMs.  Nasal: External nose without lesions. Septum midline.. Clear nasal passages Oral: Lips and gums without lesions. Tongue and palate mucosa without lesions. Posterior oropharynx clear. Neck: On palpation of the neck I do not palpate any supraclavicular or neck adenopathy.  It is a little difficult to palpate the right thyroid nodule. Fiberoptic laryngoscopy was performed through the right nostril.  The nasopharynx was clear.  On examination of the vocal cords there were clear bilaterally with normal vocal cord mobility bilaterally. Respiratory: Breathing comfortably  Skin: No facial/neck lesions or rash noted.  Laryngoscopy  Date/Time: 11/14/2019 6:18 PM Performed by: Rozetta Nunnery, MD Authorized by: Rozetta Nunnery, MD   Consent:    Consent obtained:  Verbal   Consent given by:  Patient Procedure details:    Indications: hoarseness, dysphagia, or aspiration      Medication:  Afrin   Instrument: flexible fiberoptic laryngoscope     Scope location: right nare   Mouth:    Oropharynx: normal     Vallecula: normal     Base of tongue: normal     Epiglottis: normal   Throat:    True vocal cords: normal   Comments:     Vocal cords were clear bilaterally with normal vocal cord mobility bilaterally.    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  Results of AFIRMA test (molecular testing) is pending  Plan: Briefly discussed with her concerning right thyroid lobectomy to see if this is benign adenoma versus possible cancer.  Molecular testing may be beneficial in deciding on surgery or  not.  Also have to discuss with Dr. Marin Olp as far as risk of stopping the Eliquis.   Radene Journey, MD   CC:

## 2019-11-21 ENCOUNTER — Other Ambulatory Visit: Payer: Self-pay | Admitting: Family

## 2019-11-21 ENCOUNTER — Encounter: Payer: Self-pay | Admitting: Family

## 2019-11-21 DIAGNOSIS — E559 Vitamin D deficiency, unspecified: Secondary | ICD-10-CM

## 2019-11-25 ENCOUNTER — Encounter: Payer: Self-pay | Admitting: Physician Assistant

## 2019-12-02 ENCOUNTER — Encounter (HOSPITAL_COMMUNITY): Payer: Self-pay

## 2019-12-02 NOTE — Telephone Encounter (Signed)
Please reach out to [facility] again to see if they have report since we have received nothing.

## 2019-12-03 ENCOUNTER — Encounter: Payer: Self-pay | Admitting: Physician Assistant

## 2019-12-09 ENCOUNTER — Inpatient Hospital Stay: Payer: Medicare Other | Attending: Family

## 2019-12-09 ENCOUNTER — Inpatient Hospital Stay (HOSPITAL_BASED_OUTPATIENT_CLINIC_OR_DEPARTMENT_OTHER): Payer: Medicare Other | Admitting: Family

## 2019-12-09 ENCOUNTER — Encounter: Payer: Self-pay | Admitting: Family

## 2019-12-09 ENCOUNTER — Other Ambulatory Visit: Payer: Self-pay

## 2019-12-09 VITALS — BP 138/80 | HR 83 | Temp 97.6°F | Resp 19 | Ht 70.0 in | Wt 313.0 lb

## 2019-12-09 DIAGNOSIS — R197 Diarrhea, unspecified: Secondary | ICD-10-CM | POA: Insufficient documentation

## 2019-12-09 DIAGNOSIS — Z79899 Other long term (current) drug therapy: Secondary | ICD-10-CM | POA: Insufficient documentation

## 2019-12-09 DIAGNOSIS — N898 Other specified noninflammatory disorders of vagina: Secondary | ICD-10-CM | POA: Insufficient documentation

## 2019-12-09 DIAGNOSIS — I749 Embolism and thrombosis of unspecified artery: Secondary | ICD-10-CM

## 2019-12-09 DIAGNOSIS — I2602 Saddle embolus of pulmonary artery with acute cor pulmonale: Secondary | ICD-10-CM | POA: Diagnosis not present

## 2019-12-09 DIAGNOSIS — I5189 Other ill-defined heart diseases: Secondary | ICD-10-CM | POA: Insufficient documentation

## 2019-12-09 DIAGNOSIS — I2692 Saddle embolus of pulmonary artery without acute cor pulmonale: Secondary | ICD-10-CM | POA: Diagnosis not present

## 2019-12-09 DIAGNOSIS — K5900 Constipation, unspecified: Secondary | ICD-10-CM | POA: Diagnosis not present

## 2019-12-09 DIAGNOSIS — D6859 Other primary thrombophilia: Secondary | ICD-10-CM | POA: Diagnosis not present

## 2019-12-09 DIAGNOSIS — E559 Vitamin D deficiency, unspecified: Secondary | ICD-10-CM

## 2019-12-09 DIAGNOSIS — Z7901 Long term (current) use of anticoagulants: Secondary | ICD-10-CM | POA: Insufficient documentation

## 2019-12-09 DIAGNOSIS — I742 Embolism and thrombosis of arteries of the upper extremities: Secondary | ICD-10-CM | POA: Diagnosis present

## 2019-12-09 LAB — CMP (CANCER CENTER ONLY)
ALT: 13 U/L (ref 0–44)
AST: 18 U/L (ref 15–41)
Albumin: 4.2 g/dL (ref 3.5–5.0)
Alkaline Phosphatase: 91 U/L (ref 38–126)
Anion gap: 7 (ref 5–15)
BUN: 18 mg/dL (ref 8–23)
CO2: 26 mmol/L (ref 22–32)
Calcium: 10 mg/dL (ref 8.9–10.3)
Chloride: 104 mmol/L (ref 98–111)
Creatinine: 0.72 mg/dL (ref 0.44–1.00)
GFR, Est AFR Am: >60 mL/min (ref >60)
GFR, Estimated: >60 mL/min (ref >60)
Glucose, Bld: 114 mg/dL — ABNORMAL HIGH (ref 70–99)
Potassium: 4.4 mmol/L (ref 3.5–5.1)
Sodium: 137 mmol/L (ref 135–145)
Total Bilirubin: 0.5 mg/dL (ref 0.3–1.2)
Total Protein: 7.1 g/dL (ref 6.5–8.1)

## 2019-12-09 LAB — CBC WITH DIFFERENTIAL (CANCER CENTER ONLY)
Abs Immature Granulocytes: 0.02 10*3/uL (ref 0.00–0.07)
Basophils Absolute: 0 10*3/uL (ref 0.0–0.1)
Basophils Relative: 1 %
Eosinophils Absolute: 0.1 10*3/uL (ref 0.0–0.5)
Eosinophils Relative: 3 %
HCT: 47.5 % — ABNORMAL HIGH (ref 36.0–46.0)
Hemoglobin: 15.9 g/dL — ABNORMAL HIGH (ref 12.0–15.0)
Immature Granulocytes: 0 %
Lymphocytes Relative: 29 %
Lymphs Abs: 1.6 10*3/uL (ref 0.7–4.0)
MCH: 30 pg (ref 26.0–34.0)
MCHC: 33.5 g/dL (ref 30.0–36.0)
MCV: 89.6 fL (ref 80.0–100.0)
Monocytes Absolute: 0.4 10*3/uL (ref 0.1–1.0)
Monocytes Relative: 6 %
Neutro Abs: 3.4 10*3/uL (ref 1.7–7.7)
Neutrophils Relative %: 61 %
Platelet Count: 202 10*3/uL (ref 150–400)
RBC: 5.3 MIL/uL — ABNORMAL HIGH (ref 3.87–5.11)
RDW: 13.6 % (ref 11.5–15.5)
WBC Count: 5.5 10*3/uL (ref 4.0–10.5)
nRBC: 0 % (ref 0.0–0.2)

## 2019-12-09 LAB — D-DIMER, QUANTITATIVE: D-Dimer, Quant: 0.34 ug/mL-FEU (ref 0.00–0.50)

## 2019-12-09 LAB — VITAMIN D 25 HYDROXY (VIT D DEFICIENCY, FRACTURES): Vit D, 25-Hydroxy: 91.53 ng/mL (ref 30–100)

## 2019-12-09 NOTE — Progress Notes (Signed)
Hematology and Oncology Follow Up Visit  Leslie Knapp [id number] 1950/09/09 69 y.o. 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 She is doing well on Eliquis and has had no issues with bleeding, bruising or petechiae.  Since having Covid she states that she still has some brain fog.  No fever, chills, n/v, cough, rash, dizziness, SOB, chest pain, palpitations, abdominal pain or changes in bladder habits.  She had a right lobe thyroid biopsy last month and is waiting to hear from ENT about the results.  She varies between constipation and diarrhea at times.  She has noted some vaginal discharge which she says she has had in the past with polyps. She asked about a gynecologist as she does not have one here and I recommended Dr. Jerilynn Mages. [name]. She plans to contact her office this week for a new patient appointment.  No swelling, tenderness, numbness or tingling in her extremities.  No falls or syncopal episodes to report. She ambulates with a rolling walker for added support.  She has maintained a good appetite and is staying well hydrated. Her weight is stable.   ECOG Performance Status: 1 - Symptomatic but completely ambulatory  Medications:  Allergies as of 12/09/2019      Reactions   Celebrex [celecoxib] Swelling, Rash   Lactose Intolerance (gi) Other (See Comments)      Medication List       Accurate as of December 09, 2019  9:52 AM. If you have any questions, ask your nurse or doctor.        apixaban 5 MG Tabs tablet Commonly known as: Eliquis Take 1 tablet (5 mg total) by mouth 2 (two) times daily.   benzonatate 100 MG capsule Commonly known as: TESSALON Take 1 capsule (100 mg total) by mouth 3 (three) times daily as needed for cough.     cyclobenzaprine 10 MG tablet Commonly known as: FLEXERIL Take 1 tablet (10 mg total) by mouth at bedtime.   Enzyme Digest Caps Take by mouth.   GENTLE LAXATIVE PO Take by mouth.   MINERALS PO Take 1 oz by mouth daily.   NON FORMULARY ROLFING: Deep Tissue Massage, 2 Txs weekly as needed for joint pain   PROBIOTIC-10 PO Take 1 tablet by mouth daily.   Vitamin D-3 25 MCG (1000 UT) Caps Take 1 each by mouth daily.       Allergies:  Allergies  Allergen Reactions  . Celebrex [Celecoxib] Swelling and Rash  . Lactose Intolerance (Gi) Other (See Comments)    Past Medical History, Surgical history, Social history, and Family History were reviewed and updated.  Review of Systems: All other 10 point review of systems is negative.   Physical Exam:  height is 5\' 10"  (1.778 m) and weight is 313 lb (142 kg) (abnormal). Her oral temperature is 97.6 F (36.4 C). Her blood pressure is 138/80 and her pulse is 83. Her respiration is 19 and oxygen saturation is 98%.   Wt Readings from Last 3 Encounters:  12/09/19 (!) 313 lb (142 kg)  10/03/19 (!) 304 lb (137.9 kg)  09/24/19 (!) 311 lb (141.1 kg)    Ocular: Sclerae unicteric, pupils equal, round and reactive to light Ear-nose-throat: Oropharynx clear, dentition fair Lymphatic: No  cervical or supraclavicular adenopathy Lungs no rales or rhonchi, good excursion bilaterally Heart regular rate and rhythm, no murmur appreciated Abd soft, nontender, positive bowel sounds, no liver or spleen tip palpated on exam, no fluid wave  MSK no focal spinal tenderness, no joint edema Neuro: non-focal, well-oriented, appropriate affect Breasts: Deferred   Lab Results  Component Value Date   WBC 5.5 12/09/2019   HGB 15.9 (H) 12/09/2019   HCT 47.5 (H) 12/09/2019   MCV 89.6 12/09/2019   PLT 202 12/09/2019   Lab Results  Component Value Date   FERRITIN 136.2 03/28/2016   Lab Results  Component Value Date   RBC 5.30 (H) 12/09/2019   No  results found for: KPAFRELGTCHN, LAMBDASER, KAPLAMBRATIO No results found for: IGGSERUM, IGA, IGMSERUM No results found for: Odetta Pink, SPEI   Chemistry      Component Value Date/Time   NA 137 12/09/2019 0820   NA 140 09/24/2019 1934   K 4.4 12/09/2019 0820   CL 104 12/09/2019 0820   CO2 26 12/09/2019 0820   BUN 18 12/09/2019 0820   BUN 15 09/24/2019 1934   CREATININE 0.72 12/09/2019 0820   GLU 87 05/21/2018 0000      Component Value Date/Time   CALCIUM 10.0 12/09/2019 0820   ALKPHOS 91 12/09/2019 0820   AST 18 12/09/2019 0820   ALT 13 12/09/2019 0820   BILITOT 0.5 12/09/2019 0820       Impression and Plan: Leslie Knapp is a very pleasant 69 yo caucasian femalewith recent diagnosisof bilateral saddle pulmonary emboli with right heart strain as well as a right brachial artery thrombus.Lab work up did show both a protein C and a protein S deficiency. She continues to do well on Eliquis and will continue her same regimen.  We will see her back in another 4 months.   She will contact our office with any questions or concerns. We can certainly see her sooner if needed.   [name], NP 6/1/20219:52 AM

## 2019-12-10 LAB — PROTEIN S ACTIVITY: Protein S Activity: 101 % (ref 63–140)

## 2019-12-10 LAB — PROTEIN C ACTIVITY: Protein C Activity: 113 % (ref 73–180)

## 2019-12-10 LAB — PROTEIN S, TOTAL: Protein S Ag, Total: 91 % (ref 60–150)

## 2019-12-13 LAB — PROTEIN C, TOTAL: Protein C, Total: 103 % (ref 60–150)

## 2019-12-15 ENCOUNTER — Other Ambulatory Visit: Payer: Self-pay

## 2019-12-15 ENCOUNTER — Telehealth (INDEPENDENT_AMBULATORY_CARE_PROVIDER_SITE_OTHER): Payer: Medicare Other | Admitting: Physician Assistant

## 2019-12-15 DIAGNOSIS — I749 Embolism and thrombosis of unspecified artery: Secondary | ICD-10-CM | POA: Diagnosis not present

## 2019-12-15 DIAGNOSIS — Z8742 Personal history of other diseases of the female genital tract: Secondary | ICD-10-CM

## 2019-12-15 DIAGNOSIS — Z9889 Other specified postprocedural states: Secondary | ICD-10-CM

## 2019-12-15 DIAGNOSIS — N898 Other specified noninflammatory disorders of vagina: Secondary | ICD-10-CM | POA: Diagnosis not present

## 2019-12-15 NOTE — Progress Notes (Signed)
Virtual Visit via Video   I connected with patient on 12/15/19 at  8:00 AM EDT by a video enabled telemedicine application and verified that I am speaking with the correct person using two identifiers.  Location patient: Home Location provider: [facility], Office Persons participating in the virtual visit: Patient, Provider, [place] (Patina Moore)  I discussed the limitations of evaluation and management by telemedicine and the availability of in person appointments. The patient expressed understanding and agreed to proceed.  Subjective:   HPI:   Patient presents via [place] today requesting a referral to GYN. Notes vaginal discharge (not sexually active) -- mucoid in nature and some vaginal pressure. Notes history of a cervical polyp without history of HPV. Had seen GYN in the past for removal of cervical polyp and was told to be seen again if this recurred. Denies vaginal pain. Denies hematuria, urinary urgency, frequency, vaginal bleeding.  Denies fever, chills, malaise or fatigue.  ROS:   See pertinent positives and negatives per HPI.  Patient Active Problem List   Diagnosis Date Noted  . 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 Aortic atherosclerosis ([place]) 03/17/2019  . Brachial artery occlusion, right ([place]) 03/14/2019  . Abdominal pain 03/08/2019  . Pure hypercholesterolemia 09/25/2017  . Morbid obesity ([place]) 06/07/2017  . Encounter for Medicare annual wellness exam 06/07/2017  . Vitamin D deficiency 06/07/2017  . IBS (irritable bowel syndrome) 08/29/2016  . Migraine 08/29/2016  . Disorder of musculoskeletal system 03/23/2015  . Eye exam abnormal 03/23/2015  . NASH (nonalcoholic steatohepatitis) 10/19/2011    Social History   Tobacco Use  . Smoking status: Never Smoker  . Smokeless tobacco: Never Used  Substance Use Topics  . Alcohol use: No    Alcohol/week: 0.0 standard drinks     Current Outpatient Medications:  .  apixaban (ELIQUIS) 5 MG TABS tablet, Take 1 tablet (5 mg total) by mouth 2 (two) times daily., Disp: 60 tablet, Rfl: 4 .  benzonatate (TESSALON) 100 MG capsule, Take 1 capsule (100 mg total) by mouth 3 (three) times daily as needed for cough. (Patient not taking: Reported on 11/14/2019), Disp: 30 capsule, Rfl: 0 .  Bisacodyl (GENTLE LAXATIVE PO), Take by mouth., Disp: , Rfl:  .  Cholecalciferol (VITAMIN D-3) 1000 UNITS CAPS, Take 1 each by mouth daily., Disp: , Rfl:  .  cyclobenzaprine (FLEXERIL) 10 MG tablet, Take 1 tablet (10 mg total) by mouth at bedtime., Disp: 30 tablet, Rfl: 0 .  Digestive Enzymes (ENZYME DIGEST) CAPS, Take by mouth., Disp: , Rfl:  .  NON FORMULARY, ROLFING: Deep Tissue Massage, 2 Txs weekly as needed for joint pain, Disp: , Rfl:  .  Probiotic Product (PROBIOTIC-10 PO), Take 1 tablet by mouth daily., Disp: , Rfl:  .  Trace Min [id number] (MINERALS PO), Take 1 oz by mouth daily., Disp: , Rfl:   Allergies  Allergen Reactions  . Celebrex [Celecoxib] Swelling and Rash  . Lactose Intolerance (Gi) Other (See Comments)    Objective:   There were no vitals taken for this visit.  Patient is well-developed, well-nourished in no acute distress.  Resting comfortably at home.  Head is normocephalic, atraumatic.  No labored breathing.  Speech is clear and coherent with logical content.  Patient is alert and oriented at baseline.   Assessment and Plan:   1. Vaginal discharge 2. History of cervical polypectomy Patient is not sexually active.  Mucoid.  Some vaginal  pressure noted without itching or pain.  Will refer back to gynecology per patient request for vaginal exam and also due to her history of cervical polyp.  She will go to the med [facility] office to provide urine sample for ancillary testing for yeast and BV in the meantime.  Supportive measures reviewed with patient.  She is to notify us ASAP if anything worsens  or new symptoms develop.  Patient voiced understanding and agreement with the plan. - Ambulatory referral to Gynecology - Urine cytology ancillary only([facility])     Leeanne Rio, [place] 12/15/2019

## 2019-12-16 ENCOUNTER — Telehealth (INDEPENDENT_AMBULATORY_CARE_PROVIDER_SITE_OTHER): Payer: Self-pay | Admitting: Otolaryngology

## 2019-12-16 ENCOUNTER — Encounter: Payer: Self-pay | Admitting: Physician Assistant

## 2019-12-16 ENCOUNTER — Telehealth: Payer: Self-pay | Admitting: Physician Assistant

## 2019-12-16 NOTE — Telephone Encounter (Signed)
I called Leslie Knapp today concerning the Afirma report cytology.  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  Afirma report was benign with an estimated risk of malignancy of 4%. I suggested observation of the nodule at this point and would recommend repeat ultrasound in 1 year.  If it enlarges would recommend consideration of removal in 1 year but present findings are more consistent with a benign follicular lesion. She will call us back in 1 year to schedule repeat ultrasound.

## 2019-12-16 NOTE — Telephone Encounter (Signed)
Error

## 2019-12-17 NOTE — Addendum Note (Signed)
Addended by: Kelle Darting A on: 12/17/2019 04:31 PM   Modules accepted: Orders

## 2019-12-18 ENCOUNTER — Other Ambulatory Visit: Payer: Self-pay

## 2019-12-18 ENCOUNTER — Other Ambulatory Visit: Payer: Medicare Other

## 2019-12-18 ENCOUNTER — Other Ambulatory Visit (HOSPITAL_COMMUNITY)
Admission: RE | Admit: 2019-12-18 | Discharge: 2019-12-18 | Disposition: A | Discharge status: Home / Self Care | Payer: Medicare Other | Source: Ambulatory Visit | Attending: Physician Assistant | Admitting: Physician Assistant

## 2019-12-18 DIAGNOSIS — N898 Other specified noninflammatory disorders of vagina: Secondary | ICD-10-CM | POA: Diagnosis not present

## 2019-12-22 ENCOUNTER — Telehealth: Payer: Medicare Other | Admitting: Physician Assistant

## 2019-12-25 ENCOUNTER — Encounter: Payer: Self-pay | Admitting: Physician Assistant

## 2019-12-26 ENCOUNTER — Other Ambulatory Visit: Payer: Self-pay

## 2019-12-26 DIAGNOSIS — N898 Other specified noninflammatory disorders of vagina: Secondary | ICD-10-CM

## 2019-12-26 NOTE — Telephone Encounter (Signed)
Erica -- Can we check on referral status? Thank you.  Janett Billow -- Can we check on status of her urine ancillary testing? Was collected at [facility] office > 1 week ago.

## 2019-12-30 LAB — URINE CYTOLOGY ANCILLARY ONLY

## 2020-01-01 ENCOUNTER — Other Ambulatory Visit (HOSPITAL_COMMUNITY)
Admission: RE | Admit: 2020-01-01 | Discharge: 2020-01-01 | Disposition: A | Discharge status: Home / Self Care | Payer: Medicare Other | Source: Ambulatory Visit | Attending: Physician Assistant | Admitting: Physician Assistant

## 2020-01-01 ENCOUNTER — Ambulatory Visit: Payer: Medicare Other

## 2020-01-01 ENCOUNTER — Other Ambulatory Visit: Payer: Self-pay

## 2020-01-01 DIAGNOSIS — N898 Other specified noninflammatory disorders of vagina: Secondary | ICD-10-CM

## 2020-01-05 LAB — URINE CYTOLOGY ANCILLARY ONLY
Bacterial Vaginitis-Urine: NEGATIVE
Candida Urine: NEGATIVE

## 2020-01-08 ENCOUNTER — Other Ambulatory Visit: Payer: Medicare Other

## 2020-01-22 ENCOUNTER — Other Ambulatory Visit (HOSPITAL_COMMUNITY)
Admission: RE | Admit: 2020-01-22 | Discharge: 2020-01-22 | Disposition: A | Discharge status: Home / Self Care | Payer: Medicare Other | Source: Ambulatory Visit | Attending: Family Medicine | Admitting: Family Medicine

## 2020-01-22 ENCOUNTER — Other Ambulatory Visit: Payer: Self-pay

## 2020-01-22 ENCOUNTER — Ambulatory Visit (INDEPENDENT_AMBULATORY_CARE_PROVIDER_SITE_OTHER): Payer: Medicare Other | Admitting: Family Medicine

## 2020-01-22 ENCOUNTER — Encounter: Payer: Self-pay | Admitting: Family Medicine

## 2020-01-22 VITALS — BP 131/81 | HR 84 | Ht 70.0 in | Wt 303.0 lb

## 2020-01-22 DIAGNOSIS — N882 Stricture and stenosis of cervix uteri: Secondary | ICD-10-CM

## 2020-01-22 DIAGNOSIS — N952 Postmenopausal atrophic vaginitis: Secondary | ICD-10-CM | POA: Diagnosis not present

## 2020-01-22 DIAGNOSIS — N840 Polyp of corpus uteri: Secondary | ICD-10-CM | POA: Diagnosis not present

## 2020-01-22 DIAGNOSIS — R3989 Other symptoms and signs involving the genitourinary system: Secondary | ICD-10-CM | POA: Diagnosis not present

## 2020-01-22 DIAGNOSIS — I749 Embolism and thrombosis of unspecified artery: Secondary | ICD-10-CM | POA: Diagnosis not present

## 2020-01-22 NOTE — Progress Notes (Signed)
Patient is having a mucous like discharge. Patient states about ten to eleven years ago she had the same problem and it was a cervical polyp. Patient is having a burning sensation patient states that urine was clear recently.  Kathrene Alu RN

## 2020-01-22 NOTE — Progress Notes (Signed)
   Subjective:    Patient ID: Leslie Knapp, female    DOB: 1951-06-09, 69 y.o.   MRN: [id number]  HPI Patient seen for mucous like discharge that started about 4 months ago. At first, this was intermittent, but now fairly constant. Did have this about 10 years ago. Had endometrial biopsy, which showed endometrial polyp. Had D&C with removal of polyp. Denies bleeding. She was told by her Gyn that if this ever happened again, then she needs to see a gynecologist immediately.   Denies other abnormal discharge. Denies pelvic pain.  I have reviewed the patients past medical, family, and social history.  I have reviewed the patient's medication list and allergies.   Review of Systems     Objective:   Physical Exam Vitals reviewed. Exam conducted with a chaperone present.  Constitutional:      Appearance: Normal appearance.  Abdominal:     Hernia: There is no hernia in the left inguinal area or right inguinal area.  Genitourinary:    Exam position: Lithotomy position.     Pubic Area: No rash.      Labia:        Right: No rash, tenderness or lesion.        Left: No rash, tenderness or lesion.      Vagina: No signs of injury and foreign body. No vaginal discharge, erythema, tenderness or bleeding.     Cervix: No cervical motion tenderness, discharge, friability, lesion, erythema or cervical bleeding.     Comments: Atrophic vaginal mucosa and cervix. Cervix stenotic.  Lymphadenopathy:     Lower Body: No right inguinal adenopathy. No left inguinal adenopathy.  Neurological:     Mental Status: She is alert.     ENDOMETRIAL BIOPSY     The indications for endometrial biopsy were reviewed.   Risks of the biopsy including cramping, bleeding, infection, uterine perforation, inadequate specimen and need for additional procedures  were discussed. The patient states she understands and agrees to undergo procedure today. Consent was signed. Time out was performed. Urine HCG was negative. A  sterile speculum was placed in the patient's vagina and the cervix was prepped with Betadine. Benzocaine spray was applied topically. 60mL of Lidocaine 1% without epi was injected into the cervix. A single-toothed tenaculum was placed on the anterior lip of the cervix to stabilize it. The 3 mm pipelle was introduced into the endometrial cavity without difficulty to a depth of 7cm, and a moderate amount of tissue was obtained and sent to pathology. The instruments were removed from the patient's vagina. Minimal bleeding from the cervix was noted. The patient tolerated the procedure well. Routine post-procedure instructions were given to the patient. The patient will follow up to review the results and for further management.      Assessment & Plan:  1. Abnormal urogenital discharge Embx done. Will obtain US and vaginal culture. Will treat pending results. - US PELVIS TRANSVAGINAL NON-OB ([facility] ONLY); Future - Cervicovaginal ancillary only( [facility]) - Surgical pathology( [place]/ [facility])

## 2020-01-25 ENCOUNTER — Other Ambulatory Visit: Payer: Self-pay | Admitting: Family

## 2020-01-25 DIAGNOSIS — I2602 Saddle embolus of pulmonary artery with acute cor pulmonale: Secondary | ICD-10-CM

## 2020-01-25 DIAGNOSIS — I749 Embolism and thrombosis of unspecified artery: Secondary | ICD-10-CM

## 2020-01-26 ENCOUNTER — Other Ambulatory Visit: Payer: Self-pay | Admitting: Family Medicine

## 2020-01-26 LAB — CERVICOVAGINAL ANCILLARY ONLY
Bacterial Vaginitis (gardnerella): POSITIVE — AB
Candida Glabrata: NEGATIVE
Candida Vaginitis: NEGATIVE
Comment: NEGATIVE
Comment: NEGATIVE
Comment: NEGATIVE

## 2020-01-26 LAB — SURGICAL PATHOLOGY

## 2020-01-26 MED ORDER — METRONIDAZOLE 500 MG PO TABS
500.0000 mg | ORAL_TABLET | Freq: Two times a day (BID) | ORAL | 0 refills | Status: DC
Start: 2020-01-26 — End: 2020-03-22

## 2020-02-02 ENCOUNTER — Ambulatory Visit (INDEPENDENT_AMBULATORY_CARE_PROVIDER_SITE_OTHER): Payer: Medicare Other

## 2020-02-02 VITALS — Ht 70.0 in | Wt 303.0 lb

## 2020-02-02 DIAGNOSIS — Z0000 Encounter for general adult medical examination without abnormal findings: Secondary | ICD-10-CM

## 2020-02-02 NOTE — Patient Instructions (Signed)
Leslie Knapp , Thank you for taking time to come for your Medicare Wellness Visit. I appreciate your ongoing commitment to your health goals. Please review the following plan we discussed and let me know if I can assist you in the future.   Screening recommendations/referrals: Colonoscopy: Completed 07/2009- Due 07/2019-Please call the office when you are ready to schedule Mammogram: Completed 06/12/2019-Due 06/11/2020 Bone Density: Due-Please call the office when you are ready to schedule. Recommended yearly ophthalmology/optometry visit for glaucoma screening and checkup Recommended yearly dental visit for hygiene and checkup  Vaccinations: Influenza vaccine: Due-03/2020 Pneumococcal vaccine: Completed vaccines Tdap vaccine: Up to Date- Due 07/12/2027 Shingles vaccine: Discuss with pharmacy   Covid-19:Declined vaccines  Advanced directives: Copy on file  Conditions/risks identified: See problem list  Next appointment: Follow up in one year for your annual wellness visit    Preventive Care 41 Years and Older, Female Preventive care refers to lifestyle choices and visits with your health care provider that can promote health and wellness. What does preventive care include?  A yearly physical exam. This is also called an annual well check.  Dental exams once or twice a year.  Routine eye exams. Ask your health care provider how often you should have your eyes checked.  Personal lifestyle choices, including:  Daily care of your teeth and gums.  Regular physical activity.  Eating a healthy diet.  Avoiding tobacco and drug use.  Limiting alcohol use.  Practicing safe sex.  Taking low-dose aspirin every day.  Taking vitamin and mineral supplements as recommended by your health care provider. What happens during an annual well check? The services and screenings done by your health care provider during your annual well check will depend on your age, overall health, lifestyle  risk factors, and family history of disease. Counseling  Your health care provider may ask you questions about your:  Alcohol use.  Tobacco use.  Drug use.  Emotional well-being.  Home and relationship well-being.  Sexual activity.  Eating habits.  History of falls.  Memory and ability to understand (cognition).  Work and work Statistician.  Reproductive health. Screening  You may have the following tests or measurements:  Height, weight, and BMI.  Blood pressure.  Lipid and cholesterol levels. These may be checked every 5 years, or more frequently if you are over 53 years old.  Skin check.  Lung cancer screening. You may have this screening every year starting at age 78 if you have a 30-pack-year history of smoking and currently smoke or have quit within the past 15 years.  Fecal occult blood test (FOBT) of the stool. You may have this test every year starting at age 31.  Flexible sigmoidoscopy or colonoscopy. You may have a sigmoidoscopy every 5 years or a colonoscopy every 10 years starting at age 55.  Hepatitis C blood test.  Hepatitis B blood test.  Sexually transmitted disease (STD) testing.  Diabetes screening. This is done by checking your blood sugar (glucose) after you have not eaten for a while (fasting). You may have this done every 1-3 years.  Bone density scan. This is done to screen for osteoporosis. You may have this done starting at age 28.  Mammogram. This may be done every 1-2 years. Talk to your health care provider about how often you should have regular mammograms. Talk with your health care provider about your test results, treatment options, and if necessary, the need for more tests. Vaccines  Your health care provider may recommend certain  vaccines, such as:  Influenza vaccine. This is recommended every year.  Tetanus, diphtheria, and acellular pertussis (Tdap, Td) vaccine. You may need a Td booster every 10 years.  Zoster vaccine.  You may need this after age 77.  Pneumococcal 13-valent conjugate (PCV13) vaccine. One dose is recommended after age 13.  Pneumococcal polysaccharide (PPSV23) vaccine. One dose is recommended after age 13. Talk to your health care provider about which screenings and vaccines you need and how often you need them. This information is not intended to replace advice given to you by your health care provider. Make sure you discuss any questions you have with your health care provider. Document Released: 07/23/2015 Document Revised: 03/15/2016 Document Reviewed: 04/27/2015 Elsevier Interactive Patient Education  2017 [place] Prevention in the Home Falls can cause injuries. They can happen to people of all ages. There are many things you can do to make your home safe and to help prevent falls. What can I do on the outside of my home?  Regularly fix the edges of walkways and driveways and fix any cracks.  Remove anything that might make you trip as you walk through a door, such as a raised step or threshold.  Trim any bushes or trees on the path to your home.  Use bright outdoor lighting.  Clear any walking paths of anything that might make someone trip, such as rocks or tools.  Regularly check to see if handrails are loose or broken. Make sure that both sides of any steps have handrails.  Any raised decks and porches should have guardrails on the edges.  Have any leaves, snow, or ice cleared regularly.  Use sand or salt on walking paths during winter.  Clean up any spills in your garage right away. This includes oil or grease spills. What can I do in the bathroom?  Use night lights.  Install grab bars by the toilet and in the tub and shower. Do not use towel bars as grab bars.  Use non-skid mats or decals in the tub or shower.  If you need to sit down in the shower, use a plastic, non-slip stool.  Keep the floor dry. Clean up any water that spills on the floor as soon  as it happens.  Remove soap buildup in the tub or shower regularly.  Attach bath mats securely with double-sided non-slip rug tape.  Do not have throw rugs and other things on the floor that can make you trip. What can I do in the bedroom?  Use night lights.  Make sure that you have a light by your bed that is easy to reach.  Do not use any sheets or blankets that are too big for your bed. They should not hang down onto the floor.  Have a firm chair that has side arms. You can use this for support while you get dressed.  Do not have throw rugs and other things on the floor that can make you trip. What can I do in the kitchen?  Clean up any spills right away.  Avoid walking on wet floors.  Keep items that you use a lot in easy-to-reach places.  If you need to reach something above you, use a strong step stool that has a grab bar.  Keep electrical cords out of the way.  Do not use floor polish or wax that makes floors slippery. If you must use wax, use non-skid floor wax.  Do not have throw rugs and other things  on the floor that can make you trip. What can I do with my stairs?  Do not leave any items on the stairs.  Make sure that there are handrails on both sides of the stairs and use them. Fix handrails that are broken or loose. Make sure that handrails are as long as the stairways.  Check any carpeting to make sure that it is firmly attached to the stairs. Fix any carpet that is loose or worn.  Avoid having throw rugs at the top or bottom of the stairs. If you do have throw rugs, attach them to the floor with carpet tape.  Make sure that you have a light switch at the top of the stairs and the bottom of the stairs. If you do not have them, ask someone to add them for you. What else can I do to help prevent falls?  Wear shoes that:  Do not have high heels.  Have rubber bottoms.  Are comfortable and fit you well.  Are closed at the toe. Do not wear sandals.  If  you use a stepladder:  Make sure that it is fully opened. Do not climb a closed stepladder.  Make sure that both sides of the stepladder are locked into place.  Ask someone to hold it for you, if possible.  Clearly mark and make sure that you can see:  Any grab bars or handrails.  First and last steps.  Where the edge of each step is.  Use tools that help you move around (mobility aids) if they are needed. These include:  Canes.  Walkers.  Scooters.  Crutches.  Turn on the lights when you go into a dark area. Replace any light bulbs as soon as they burn out.  Set up your furniture so you have a clear path. Avoid moving your furniture around.  If any of your floors are uneven, fix them.  If there are any pets around you, be aware of where they are.  Review your medicines with your doctor. Some medicines can make you feel dizzy. This can increase your chance of falling. Ask your doctor what other things that you can do to help prevent falls. This information is not intended to replace advice given to you by your health care provider. Make sure you discuss any questions you have with your health care provider. Document Released: 04/22/2009 Document Revised: 12/02/2015 Document Reviewed: 07/31/2014 Elsevier Interactive Patient Education  2017 [employer].

## 2020-02-02 NOTE — Progress Notes (Addendum)
Subjective:   Leslie Knapp is a 69 y.o. female who presents for Medicare Annual (Subsequent) preventive examination.  I connected with [name] today by telephone and verified that I am speaking with the correct person using two identifiers. Location patient: home Location provider: work Persons participating in the virtual visit: patient, Marine scientist.    I discussed the limitations, risks, security and privacy concerns of performing an evaluation and management service by telephone and the availability of in person appointments. I also discussed with the patient that there may be a patient responsible charge related to this service. The patient expressed understanding and verbally consented to this telephonic visit.    Interactive audio and video telecommunications were attempted between this provider and patient, however failed, due to patient having technical difficulties OR patient did not have access to video capability.  We continued and completed visit with audio only.  Some vital signs may be absent or patient reported.   Time Spent with patient on telephone encounter: 45 minutes  Review of Systems     Cardiac Risk Factors include: advanced age (>26men, >42 women);dyslipidemia;obesity (BMI >30kg/m2)     Objective:    Today's Vitals   02/02/20 0949  Weight: (!) 303 lb (137.4 kg)  Height: 5\' 10"  (1.778 m)   Body mass index is 43.48 kg/m.  Advanced Directives 02/02/2020 12/09/2019 10/03/2019 08/11/2019 06/23/2019 05/19/2019 03/08/2019  Does Patient Have a Medical Advance Directive? Yes Yes Yes Yes Yes No No  Type of Paramedic of [place];Living will [place];Living will [place];Living will [place];Living will [place];Living will - -  Does patient want to make changes to medical advance directive? - No - Patient declined - No - Patient declined No - Patient declined - -  Copy of  [place] in Chart? Yes - validated most recent copy scanned in chart (See row information) No - copy requested - No - copy requested No - copy requested - -  Would patient like information on creating a medical advance directive? - - - No - Patient declined No - Patient declined No - Patient declined -    Current Medications (verified) Outpatient Encounter Medications as of 02/02/2020  Medication Sig  . Bisacodyl (GENTLE LAXATIVE PO) Take by mouth.  . Cholecalciferol (VITAMIN D-3) 1000 UNITS CAPS Take 1 each by mouth daily.  . Digestive Enzymes (ENZYME DIGEST) CAPS Take by mouth.  Arne Cleveland 5 MG TABS tablet Take 1 tablet by mouth twice daily  . ibuprofen (ADVIL) 400 MG tablet Take 400 mg by mouth every 6 (six) hours as needed.  . metroNIDAZOLE (FLAGYL) 500 MG tablet Take 1 tablet (500 mg total) by mouth 2 (two) times daily.  . Probiotic Product (PROBIOTIC-10 PO) Take 1 tablet by mouth daily.  Colbert Coyer Min [id number] (MINERALS PO) Take 1 oz by mouth daily.  . NON FORMULARY ROLFING: Deep Tissue Massage, 2 Txs weekly as needed for joint pain  . [DISCONTINUED] benzonatate (TESSALON) 100 MG capsule Take 1 capsule (100 mg total) by mouth 3 (three) times daily as needed for cough. (Patient not taking: Reported on 11/14/2019)  . [DISCONTINUED] cyclobenzaprine (FLEXERIL) 10 MG tablet Take 1 tablet (10 mg total) by mouth at bedtime.   No facility-administered encounter medications on file as of 02/02/2020.    Allergies (verified) Celebrex [celecoxib] and Lactose intolerance (gi)   History: Past Medical History:  Diagnosis Date  . CTS (carpal tunnel syndrome)   .  Fatty liver disease, nonalcoholic    In [facility] Study  . IBS (irritable bowel syndrome)    C/D  . Morbid obesity ([place])   . Osteoarthritis    Past Surgical History:  Procedure Laterality Date  . KNEE SURGERY  1975   Left Patella Tendon Replacement  . TONSILLECTOMY  1963  . TOTAL KNEE ARTHROPLASTY  06.2005    Left  . TOTAL KNEE ARTHROPLASTY  11.2005   Right  . UTERINE POLYP REMOVAL  2010   Family History  Problem Relation Age of Onset  . Stroke Mother 31       Deceased  . Hypertension Mother   . GI problems Mother   . Arthritis Mother   . Dementia Mother   . Heart defect Father 74       Deceased  . Parkinson's disease Father   . Varicose Veins Father   . Stroke Maternal Grandfather   . Arthritis Maternal Grandfather   . Kidney failure Maternal Grandmother   . Gallbladder disease Maternal Grandmother   . Diverticulitis Sister        #1  . Arthritis Sister   . Healthy Sister        #2   Social History   Socioeconomic History  . Marital status: Widowed    Spouse name: Not on file  . Number of children: Not on file  . Years of education: Not on file  . Highest education level: Not on file  Occupational History  . Occupation: retired  Tobacco Use  . Smoking status: Never Smoker  . Smokeless tobacco: Never Used  Vaping Use  . Vaping Use: Never used  Substance and Sexual Activity  . Alcohol use: No    Alcohol/week: 0.0 standard drinks  . Drug use: No  . Sexual activity: Never  Other Topics Concern  . Not on file  Social History Narrative  . Not on file   Social Determinants of Health   Financial Resource Strain: Low Risk   . Difficulty of Paying Living Expenses: Not hard at all  Food Insecurity: No Food Insecurity  . Worried About Charity fundraiser in the Last Year: Never true  . Ran Out of Food in the Last Year: Never true  Transportation Needs: No Transportation Needs  . Lack of Transportation (Medical): No  . Lack of Transportation (Non-Medical): No  Physical Activity: Insufficiently Active  . Days of Exercise per Week: 4 days  . Minutes of Exercise per Session: 30 min  Stress:   . Feeling of Stress :   Social Connections: Socially Isolated  . Frequency of Communication with Friends and Family: More than three times a week  . Frequency of Social  Gatherings with Friends and Family: More than three times a week  . Attends Religious Services: Never  . Active Member of Clubs or Organizations: No  . Attends Archivist Meetings: Never  . Marital Status: Widowed    Tobacco Counseling Counseling given: Not Answered   Clinical Intake:  Pre-visit preparation completed: Yes  Pain : No/denies pain     Nutritional Status: BMI > 30  Obese Nutritional Risks: None Diabetes: No  How often do you need to have someone help you when you read instructions, pamphlets, or other written materials from your doctor or pharmacy?: 1 - Never What is the last grade level you completed in school?: Bachelor's Degree  Diabetic?No  Interpreter Needed?: No  Information entered by :: Caroleen Hamman LPN   Activities of  Daily Living In your present state of health, do you have any difficulty performing the following activities: 02/02/2020  Hearing? N  Vision? N  Difficulty concentrating or making decisions? Y  Comment forgets words sometimes-pt states  it is due to Covid  Walking or climbing stairs? Y  Dressing or bathing? N  Doing errands, shopping? N  Preparing Food and eating ? N  Using the Toilet? N  In the past six months, have you accidently leaked urine? N  Do you have problems with loss of bowel control? N  Managing your Medications? N  Managing your Finances? N  Housekeeping or managing your Housekeeping? N  Some recent data might be hidden    Patient Care Team: Delorse Limber as PCP - General (Family Medicine) Shiela Mayer, MD as Referring Physician (Gastroenterology) Sheryn Bison, MD as Referring Physician (Dermatology) Avie Echevaria., MD as Referring Physician (Sports Medicine) Shearon Balo, [place] (Chiropractic Medicine) Calvert Cantor, MD as Consulting Physician (Ophthalmology) Rozetta Nunnery, MD as Consulting Physician (Otolaryngology)  Indicate any recent Medical Services you may have  received from other than [facility] providers in the past year (date may be approximate).     Assessment:   This is a routine wellness examination for [place].  Hearing/Vision screen  Hearing Screening   125Hz  250Hz  500Hz  1000Hz  2000Hz  3000Hz  4000Hz  6000Hz  8000Hz   Right ear:           Left ear:           Comments: No hearing loss  Vision Screening Comments: Patient states she has cataracts Last eye exam-[id number]-[facility]- Dr. Monica Martinez  Dietary issues and exercise activities discussed: Current Exercise Habits: Home exercise routine, Type of exercise: walking, Time (Minutes): 30, Frequency (Times/Week): 4, Weekly Exercise (Minutes/Week): 120, Intensity: Mild, Exercise limited by: None identified  Goals    . Patient Stated     Increase walking    . Weight (lb) < 275 lb (124.7 kg)     Lose weight by dieting.       Depression Screen PHQ 2/9 Scores 02/02/2020 10/24/2018 06/27/2018 06/07/2017 01/29/2017 03/08/2016  PHQ - 2 Score 0 1 0 0 1 0  PHQ- 9 Score - 3 - - 3 0    Fall Risk Fall Risk  02/02/2020 10/24/2018 06/27/2018 06/07/2017 03/08/2016  Falls in the past year? 0 0 0 No No  Number falls in past yr: 0 0 0 - -  Injury with Fall? 0 0 0 - -  Follow up Falls prevention discussed Falls evaluation completed - - -    Any stairs in or around the home? No  Home free of loose throw rugs in walkways, pet beds, electrical cords, etc? Yes Adequate lighting in your home to reduce risk of falls? Yes   ASSISTIVE DEVICES UTILIZED TO PREVENT FALLS:  Life alert? No  Use of a cane, walker or w/c? No  Grab bars in the bathroom? Yes  Shower chair or bench in shower? No  Elevated toilet seat or a handicapped toilet? No   TIMED UP AND GO:  Was the test performed? No . Virtual visit   Cognitive Function: MMSE - Mini Mental State Exam 06/27/2018 06/07/2017  Orientation to time 5 5  Orientation to Place 5 5  Registration 3 3  Attention/ Calculation 5 5  Recall 3 3  Language- name 2  objects 2 2  Language- repeat 1 1  Language- follow 3 step command 3 3  Language- read & follow  direction 1 1  Write a sentence 1 1  Copy design 1 1  Total score 30 30     6CIT Screen 02/02/2020  What Year? 0 points  What month? 0 points  What time? 0 points  Count back from 20 0 points  Months in reverse 0 points  Repeat phrase 0 points  Total Score 0    Immunizations Immunization History  Administered Date(s) Administered  . Influenza,inj,Quad PF,6+ Mos 04/07/2016, 06/07/2017  . Pneumococcal Conjugate-13 06/07/2017  . Pneumococcal Polysaccharide-23 06/27/2018  . Td 07/11/2017    TDAP status: Up to date   Flu Vaccine status: Up to date   Pneumococcal vaccine status: Up to date   Covid-19 vaccine status: Declined, Education has been provided regarding the importance of this vaccine but patient still declined. Advised may receive this vaccine at local pharmacy or Health Dept.or vaccine clinic. Aware to provide a copy of the vaccination record if obtained from local pharmacy or Health Dept. Verbalized acceptance and understanding.  Qualifies for Shingles Vaccine? Yes   Zostavax completed No   Shingrix Completed?: No.    Education has been provided regarding the importance of this vaccine. Patient has been advised to call insurance company to determine out of pocket expense if they have not yet received this vaccine. Advised may also receive vaccine at local pharmacy or Health Dept. Verbalized acceptance and understanding.  Screening Tests Health Maintenance  Topic Date Due  . DTAP VACCINES (1) 09/25/1950  . COVID-19 Vaccine (1) Never done  . DEXA SCAN  Never done  . COLONOSCOPY  07/11/2019  . INFLUENZA VACCINE  02/08/2020  . MAMMOGRAM  06/11/2021  . DTaP/Tdap/Td (2 - Tdap) 07/12/2027  . TETANUS/TDAP  07/12/2027  . Hepatitis C Screening  Completed  . PNA vac Low Risk Adult  Completed    Health Maintenance  Health Maintenance Due  Topic Date Due  . DTAP VACCINES  (1) 09/25/1950  . COVID-19 Vaccine (1) Never done  . DEXA SCAN  Never done  . COLONOSCOPY  07/11/2019    Colorectal cancer screening: Completed 07/10/2009. Repeat every 10 years  Patient declined today but is aware that procedure is due. She will call back once she decides where she wants to have done  Mammogram status: Completed 06/12/2019. Repeat every year   Bone Density Status: Due-Patient declined today. She plans to have done with mammogram in December  Lung Cancer Screening: (Low Dose CT Chest recommended if Age 69-80 years, 30 pack-year currently smoking OR have quit w/in 15years.) does not qualify.     Additional Screening:  Hepatitis C Screening:  Completed 09/15/2017  Vision Screening: Recommended annual ophthalmology exams for early detection of glaucoma and other disorders of the eye. Is the patient up to date with their annual eye exam?  Yes  Who is the provider or what is the name of the office in which the patient attends annual eye exams? Dr. Monica Martinez  Dental Screening: Recommended annual dental exams for proper oral hygiene  Community Resource Referral / Chronic Care Management: CRR required this visit?  No   CCM required this visit?  No      Plan:     I have personally reviewed and noted the following in the patient's chart:   . Medical and social history . Use of alcohol, tobacco or illicit drugs  . Current medications and supplements . Functional ability and status . Nutritional status . Physical activity . Advanced directives . List of other physicians . Hospitalizations,  surgeries, and ER visits in previous 12 months . Vitals . Screenings to include cognitive, depression, and falls . Referrals and appointments  In addition, I have reviewed and discussed with patient certain preventive protocols, quality metrics, and best practice recommendations. A written personalized care plan for preventive services as well as general preventive health  recommendations were provided to patient.  Due to this being a telephonic visit, the after visit summary with patients personalized plan was offered to patient via mail or my-chart. Patient would like to access on my-chart.     Marta Antu, LPN   07/31/2409  Nurse Health Advisor  Nurse Notes: None

## 2020-02-03 ENCOUNTER — Other Ambulatory Visit: Payer: Self-pay

## 2020-02-03 ENCOUNTER — Ambulatory Visit (HOSPITAL_BASED_OUTPATIENT_CLINIC_OR_DEPARTMENT_OTHER)
Admission: RE | Admit: 2020-02-03 | Discharge: 2020-02-03 | Disposition: A | Discharge status: Home / Self Care | Payer: Medicare Other | Source: Ambulatory Visit | Attending: Family Medicine | Admitting: Family Medicine

## 2020-02-03 DIAGNOSIS — Z780 Asymptomatic menopausal state: Secondary | ICD-10-CM | POA: Diagnosis not present

## 2020-02-03 DIAGNOSIS — R3989 Other symptoms and signs involving the genitourinary system: Secondary | ICD-10-CM | POA: Insufficient documentation

## 2020-02-03 DIAGNOSIS — R9389 Abnormal findings on diagnostic imaging of other specified body structures: Secondary | ICD-10-CM | POA: Diagnosis not present

## 2020-02-03 DIAGNOSIS — D259 Leiomyoma of uterus, unspecified: Secondary | ICD-10-CM | POA: Diagnosis not present

## 2020-02-05 NOTE — Progress Notes (Signed)
LPN [place] note reviewed.  Leeanne Rio, PA-C

## 2020-02-11 ENCOUNTER — Encounter (INDEPENDENT_AMBULATORY_CARE_PROVIDER_SITE_OTHER): Payer: Self-pay

## 2020-02-12 ENCOUNTER — Other Ambulatory Visit: Payer: Self-pay

## 2020-02-12 ENCOUNTER — Ambulatory Visit (INDEPENDENT_AMBULATORY_CARE_PROVIDER_SITE_OTHER): Payer: Medicare Other | Admitting: Family Medicine

## 2020-02-12 ENCOUNTER — Encounter: Payer: Self-pay | Admitting: Family Medicine

## 2020-02-12 VITALS — BP 97/69 | HR 77 | Ht 70.0 in | Wt 309.0 lb

## 2020-02-12 DIAGNOSIS — R3989 Other symptoms and signs involving the genitourinary system: Secondary | ICD-10-CM

## 2020-02-12 DIAGNOSIS — N840 Polyp of corpus uteri: Secondary | ICD-10-CM

## 2020-02-12 NOTE — Progress Notes (Signed)
   Subjective:    Patient ID: Leslie Knapp, female    DOB: 1951-01-04, 69 y.o.   MRN: [id number]  HPI  Patient seen for follow up of mucous discharge and endometrial biopsy. She had a few days of spotting after the biopsy, but no other problems. She continues to have mucoid discharge.   Path results of her endometrial biopsy showed benign endometrial polyp tissue.  Review of Systems     Objective:   Physical Exam Vitals reviewed.  Constitutional:      Appearance: Normal appearance.  Cardiovascular:     Rate and Rhythm: Normal rate.     Pulses: Normal pulses.  Neurological:     General: No focal deficit present.     Mental Status: She is alert. Mental status is at baseline.  Psychiatric:        Mood and Affect: Mood normal.        Behavior: Behavior normal.        Thought Content: Thought content normal.      Study Result  Narrative & Impression  CLINICAL DATA:  Abnormal discharge.  EXAM: ULTRASOUND PELVIS TRANSVAGINAL  TECHNIQUE: Transvaginal ultrasound examination of the pelvis was performed including evaluation of the uterus, ovaries, adnexal regions, and pelvic cul-de-sac.  COMPARISON:  None.  FINDINGS: Uterus  Measurements: 5.0 x 3.8 x 3.0 cm = volume: 29 mL. 11 mm fibroid is noted anteriorly in the fundus.  Endometrium  Thickness: 11 mm which is abnormally thickened. No focal abnormality visualized.  Right ovary  Measurements: 1.2 x 1.0 x 0.9 cm = volume: 1 mL. Normal appearance/no adnexal mass.  Left ovary  Not visualized.  Other findings:  No abnormal free fluid  IMPRESSION: Endometrium is abnormally thickened at 11 mm. Endometrial thickness is considered abnormal for an asymptomatic post-menopausal female. Endometrial sampling should be considered to exclude carcinoma.  Left ovary is not visualized.  Small uterine fibroid is noted.   Electronically Signed   By: Marijo Conception M.D.   On: 02/03/2020 16:07        Assessment & Plan:  1. Abnormal urogenital discharge 2. Endometrial polyp Thickened endometrium likely due to endometrial polyp. Discussed possibility of hysteroscopy with D&C vs repeating US in 6 months. Patient to think about this. Will schedule her with Dr Ihor Dow for further discussion.

## 2020-02-19 DIAGNOSIS — M5416 Radiculopathy, lumbar region: Secondary | ICD-10-CM | POA: Diagnosis not present

## 2020-02-19 DIAGNOSIS — M62830 Muscle spasm of back: Secondary | ICD-10-CM | POA: Diagnosis not present

## 2020-02-19 DIAGNOSIS — M9903 Segmental and somatic dysfunction of lumbar region: Secondary | ICD-10-CM | POA: Diagnosis not present

## 2020-02-19 DIAGNOSIS — M5414 Radiculopathy, thoracic region: Secondary | ICD-10-CM | POA: Diagnosis not present

## 2020-02-19 DIAGNOSIS — M9902 Segmental and somatic dysfunction of thoracic region: Secondary | ICD-10-CM | POA: Diagnosis not present

## 2020-02-19 DIAGNOSIS — M9901 Segmental and somatic dysfunction of cervical region: Secondary | ICD-10-CM | POA: Diagnosis not present

## 2020-02-19 DIAGNOSIS — M542 Cervicalgia: Secondary | ICD-10-CM | POA: Diagnosis not present

## 2020-02-26 DIAGNOSIS — M9902 Segmental and somatic dysfunction of thoracic region: Secondary | ICD-10-CM | POA: Diagnosis not present

## 2020-02-26 DIAGNOSIS — M5414 Radiculopathy, thoracic region: Secondary | ICD-10-CM | POA: Diagnosis not present

## 2020-02-26 DIAGNOSIS — M9903 Segmental and somatic dysfunction of lumbar region: Secondary | ICD-10-CM | POA: Diagnosis not present

## 2020-02-26 DIAGNOSIS — M542 Cervicalgia: Secondary | ICD-10-CM | POA: Diagnosis not present

## 2020-02-26 DIAGNOSIS — M9901 Segmental and somatic dysfunction of cervical region: Secondary | ICD-10-CM | POA: Diagnosis not present

## 2020-02-26 DIAGNOSIS — M62830 Muscle spasm of back: Secondary | ICD-10-CM | POA: Diagnosis not present

## 2020-02-26 DIAGNOSIS — M5416 Radiculopathy, lumbar region: Secondary | ICD-10-CM | POA: Diagnosis not present

## 2020-03-01 DIAGNOSIS — M5414 Radiculopathy, thoracic region: Secondary | ICD-10-CM | POA: Diagnosis not present

## 2020-03-01 DIAGNOSIS — M9903 Segmental and somatic dysfunction of lumbar region: Secondary | ICD-10-CM | POA: Diagnosis not present

## 2020-03-01 DIAGNOSIS — M5416 Radiculopathy, lumbar region: Secondary | ICD-10-CM | POA: Diagnosis not present

## 2020-03-01 DIAGNOSIS — M9901 Segmental and somatic dysfunction of cervical region: Secondary | ICD-10-CM | POA: Diagnosis not present

## 2020-03-01 DIAGNOSIS — M62830 Muscle spasm of back: Secondary | ICD-10-CM | POA: Diagnosis not present

## 2020-03-01 DIAGNOSIS — M542 Cervicalgia: Secondary | ICD-10-CM | POA: Diagnosis not present

## 2020-03-01 DIAGNOSIS — M9902 Segmental and somatic dysfunction of thoracic region: Secondary | ICD-10-CM | POA: Diagnosis not present

## 2020-03-04 DIAGNOSIS — M62830 Muscle spasm of back: Secondary | ICD-10-CM | POA: Diagnosis not present

## 2020-03-04 DIAGNOSIS — M5414 Radiculopathy, thoracic region: Secondary | ICD-10-CM | POA: Diagnosis not present

## 2020-03-04 DIAGNOSIS — M9903 Segmental and somatic dysfunction of lumbar region: Secondary | ICD-10-CM | POA: Diagnosis not present

## 2020-03-04 DIAGNOSIS — M5416 Radiculopathy, lumbar region: Secondary | ICD-10-CM | POA: Diagnosis not present

## 2020-03-04 DIAGNOSIS — M542 Cervicalgia: Secondary | ICD-10-CM | POA: Diagnosis not present

## 2020-03-04 DIAGNOSIS — M9901 Segmental and somatic dysfunction of cervical region: Secondary | ICD-10-CM | POA: Diagnosis not present

## 2020-03-04 DIAGNOSIS — M9902 Segmental and somatic dysfunction of thoracic region: Secondary | ICD-10-CM | POA: Diagnosis not present

## 2020-03-08 DIAGNOSIS — M9902 Segmental and somatic dysfunction of thoracic region: Secondary | ICD-10-CM | POA: Diagnosis not present

## 2020-03-08 DIAGNOSIS — M5416 Radiculopathy, lumbar region: Secondary | ICD-10-CM | POA: Diagnosis not present

## 2020-03-08 DIAGNOSIS — M5414 Radiculopathy, thoracic region: Secondary | ICD-10-CM | POA: Diagnosis not present

## 2020-03-08 DIAGNOSIS — M542 Cervicalgia: Secondary | ICD-10-CM | POA: Diagnosis not present

## 2020-03-08 DIAGNOSIS — M62830 Muscle spasm of back: Secondary | ICD-10-CM | POA: Diagnosis not present

## 2020-03-08 DIAGNOSIS — M9901 Segmental and somatic dysfunction of cervical region: Secondary | ICD-10-CM | POA: Diagnosis not present

## 2020-03-08 DIAGNOSIS — M9903 Segmental and somatic dysfunction of lumbar region: Secondary | ICD-10-CM | POA: Diagnosis not present

## 2020-03-11 DIAGNOSIS — M9901 Segmental and somatic dysfunction of cervical region: Secondary | ICD-10-CM | POA: Diagnosis not present

## 2020-03-11 DIAGNOSIS — M9902 Segmental and somatic dysfunction of thoracic region: Secondary | ICD-10-CM | POA: Diagnosis not present

## 2020-03-11 DIAGNOSIS — M5414 Radiculopathy, thoracic region: Secondary | ICD-10-CM | POA: Diagnosis not present

## 2020-03-11 DIAGNOSIS — M542 Cervicalgia: Secondary | ICD-10-CM | POA: Diagnosis not present

## 2020-03-11 DIAGNOSIS — M62830 Muscle spasm of back: Secondary | ICD-10-CM | POA: Diagnosis not present

## 2020-03-11 DIAGNOSIS — M5416 Radiculopathy, lumbar region: Secondary | ICD-10-CM | POA: Diagnosis not present

## 2020-03-11 DIAGNOSIS — M9903 Segmental and somatic dysfunction of lumbar region: Secondary | ICD-10-CM | POA: Diagnosis not present

## 2020-03-18 DIAGNOSIS — M62830 Muscle spasm of back: Secondary | ICD-10-CM | POA: Diagnosis not present

## 2020-03-18 DIAGNOSIS — M5414 Radiculopathy, thoracic region: Secondary | ICD-10-CM | POA: Diagnosis not present

## 2020-03-18 DIAGNOSIS — M9902 Segmental and somatic dysfunction of thoracic region: Secondary | ICD-10-CM | POA: Diagnosis not present

## 2020-03-18 DIAGNOSIS — M9903 Segmental and somatic dysfunction of lumbar region: Secondary | ICD-10-CM | POA: Diagnosis not present

## 2020-03-18 DIAGNOSIS — M542 Cervicalgia: Secondary | ICD-10-CM | POA: Diagnosis not present

## 2020-03-18 DIAGNOSIS — M5416 Radiculopathy, lumbar region: Secondary | ICD-10-CM | POA: Diagnosis not present

## 2020-03-18 DIAGNOSIS — M9901 Segmental and somatic dysfunction of cervical region: Secondary | ICD-10-CM | POA: Diagnosis not present

## 2020-03-22 ENCOUNTER — Encounter: Payer: Self-pay | Admitting: Family

## 2020-03-22 ENCOUNTER — Other Ambulatory Visit: Payer: Self-pay

## 2020-03-22 ENCOUNTER — Telehealth: Payer: Self-pay | Admitting: Family

## 2020-03-22 ENCOUNTER — Inpatient Hospital Stay (HOSPITAL_BASED_OUTPATIENT_CLINIC_OR_DEPARTMENT_OTHER): Payer: Medicare Other | Admitting: Family

## 2020-03-22 ENCOUNTER — Inpatient Hospital Stay: Payer: Medicare Other | Attending: Family

## 2020-03-22 VITALS — BP 133/81 | HR 80 | Temp 98.3°F | Resp 18 | Ht 70.0 in | Wt 307.8 lb

## 2020-03-22 DIAGNOSIS — Z886 Allergy status to analgesic agent status: Secondary | ICD-10-CM | POA: Diagnosis not present

## 2020-03-22 DIAGNOSIS — I2692 Saddle embolus of pulmonary artery without acute cor pulmonale: Secondary | ICD-10-CM | POA: Diagnosis not present

## 2020-03-22 DIAGNOSIS — Z79899 Other long term (current) drug therapy: Secondary | ICD-10-CM | POA: Diagnosis not present

## 2020-03-22 DIAGNOSIS — D6859 Other primary thrombophilia: Secondary | ICD-10-CM

## 2020-03-22 DIAGNOSIS — D751 Secondary polycythemia: Secondary | ICD-10-CM | POA: Diagnosis not present

## 2020-03-22 DIAGNOSIS — I749 Embolism and thrombosis of unspecified artery: Secondary | ICD-10-CM

## 2020-03-22 DIAGNOSIS — I2602 Saddle embolus of pulmonary artery with acute cor pulmonale: Secondary | ICD-10-CM | POA: Diagnosis not present

## 2020-03-22 DIAGNOSIS — I5189 Other ill-defined heart diseases: Secondary | ICD-10-CM | POA: Insufficient documentation

## 2020-03-22 DIAGNOSIS — R9389 Abnormal findings on diagnostic imaging of other specified body structures: Secondary | ICD-10-CM | POA: Insufficient documentation

## 2020-03-22 DIAGNOSIS — T148XXA Other injury of unspecified body region, initial encounter: Secondary | ICD-10-CM | POA: Diagnosis not present

## 2020-03-22 DIAGNOSIS — N840 Polyp of corpus uteri: Secondary | ICD-10-CM | POA: Insufficient documentation

## 2020-03-22 DIAGNOSIS — R490 Dysphonia: Secondary | ICD-10-CM | POA: Diagnosis not present

## 2020-03-22 DIAGNOSIS — Z7901 Long term (current) use of anticoagulants: Secondary | ICD-10-CM | POA: Insufficient documentation

## 2020-03-22 DIAGNOSIS — I742 Embolism and thrombosis of arteries of the upper extremities: Secondary | ICD-10-CM | POA: Diagnosis not present

## 2020-03-22 DIAGNOSIS — D750 Familial erythrocytosis: Secondary | ICD-10-CM | POA: Diagnosis not present

## 2020-03-22 LAB — CMP (CANCER CENTER ONLY)
ALT: 13 U/L (ref 0–44)
AST: 19 U/L (ref 15–41)
Albumin: 4.2 g/dL (ref 3.5–5.0)
Alkaline Phosphatase: 78 U/L (ref 38–126)
Anion gap: 7 (ref 5–15)
BUN: 13 mg/dL (ref 8–23)
CO2: 31 mmol/L (ref 22–32)
Calcium: 10.2 mg/dL (ref 8.9–10.3)
Chloride: 102 mmol/L (ref 98–111)
Creatinine: 0.72 mg/dL (ref 0.44–1.00)
GFR, Est AFR Am: >60 mL/min (ref >60)
GFR, Estimated: >60 mL/min (ref >60)
Glucose, Bld: 89 mg/dL (ref 70–99)
Potassium: 4.3 mmol/L (ref 3.5–5.1)
Sodium: 140 mmol/L (ref 135–145)
Total Bilirubin: 0.7 mg/dL (ref 0.3–1.2)
Total Protein: 7.2 g/dL (ref 6.5–8.1)

## 2020-03-22 LAB — CBC WITH DIFFERENTIAL (CANCER CENTER ONLY)
Abs Immature Granulocytes: 0.01 10*3/uL (ref 0.00–0.07)
Basophils Absolute: 0 10*3/uL (ref 0.0–0.1)
Basophils Relative: 0 %
Eosinophils Absolute: 0.2 10*3/uL (ref 0.0–0.5)
Eosinophils Relative: 3 %
HCT: 48.9 % — ABNORMAL HIGH (ref 36.0–46.0)
Hemoglobin: 16 g/dL — ABNORMAL HIGH (ref 12.0–15.0)
Immature Granulocytes: 0 %
Lymphocytes Relative: 32 %
Lymphs Abs: 2.3 10*3/uL (ref 0.7–4.0)
MCH: 29.7 pg (ref 26.0–34.0)
MCHC: 32.7 g/dL (ref 30.0–36.0)
MCV: 90.7 fL (ref 80.0–100.0)
Monocytes Absolute: 0.4 10*3/uL (ref 0.1–1.0)
Monocytes Relative: 5 %
Neutro Abs: 4.4 10*3/uL (ref 1.7–7.7)
Neutrophils Relative %: 60 %
Platelet Count: 204 10*3/uL (ref 150–400)
RBC: 5.39 MIL/uL — ABNORMAL HIGH (ref 3.87–5.11)
RDW: 12.7 % (ref 11.5–15.5)
WBC Count: 7.3 10*3/uL (ref 4.0–10.5)
nRBC: 0 % (ref 0.0–0.2)

## 2020-03-22 LAB — D-DIMER, QUANTITATIVE: D-Dimer, Quant: 0.64 ug/mL-FEU — ABNORMAL HIGH (ref 0.00–0.50)

## 2020-03-22 NOTE — Telephone Encounter (Signed)
Appointments scheduled calendar printed per 9/13 los

## 2020-03-22 NOTE — Progress Notes (Signed)
Hematology and Oncology Follow Up Visit  Leslie Knapp [id number] 27-Jan-1951 69 y.o. 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  She is doing well on Eliquis and has had no issues with blood loss. No petechiae.  She still notes the hematoma of the left groin. Exam today showed this to be much improved. The area has softened and decreased significantly in size. She is still able to feel it and has trouble maneuvering to clean after urinating. We will get a repeat US to re-evaluate.  Hgb is elevated at 16.0. She has had intermittently elevated Hgb over the last year with our records. We will get a JAK2 genotype to assess for cause of erythrocytosis. We did not have enough blood to check iron studies and will add for next visit.  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 She states that Dr. Lucia Gaskins is following this. If they decide to move forward with thyroidectomy she will let us know and we can instruct her on when to hold her Eliquis and when to restart.  She has intermittent hoarseness with this.  She has also had a vaginal mucus discharge and recent US with gynecologist Dr. Nehemiah Settle showed thickened endometrium felt to be likely due to endometrial polyp. She states that she is considering D&C vs. Repeat US in 6 months. If D&C needed we will instruct her on how to take her ELiquis before and after.  No fever, chills, n/v, cough, rash, dizziness, SOB, chest pain, palpitations, abdominal pain or changes in bowel or bladder habits.  No swelling, tenderness, numbness or tingling in her extremities.  No falls or syncopal episodes to report.  She is eating well and hydrating appropriately. Her weight is stable.   ECOG  Performance Status: 1 - Symptomatic but completely ambulatory  Medications:  Allergies as of 03/22/2020      Reactions   Celebrex [celecoxib] Swelling, Rash   Lactose Intolerance (gi) Other (See Comments)      Medication List       Accurate as of March 22, 2020  3:20 PM. If you have any questions, ask your nurse or doctor.        Eliquis 5 MG Tabs tablet Generic drug: apixaban Take 1 tablet by mouth twice daily   Enzyme Digest Caps Take by mouth.   GENTLE LAXATIVE PO Take by mouth.   ibuprofen 400 MG tablet Commonly known as: ADVIL Take 400 mg by mouth every 6 (six) hours as needed.   metroNIDAZOLE 500 MG tablet Commonly known as: FLAGYL Take 1 tablet (500 mg total) by mouth 2 (two) times daily.   MINERALS PO Take 1 oz by mouth daily.   NON FORMULARY ROLFING: Deep Tissue Massage, 2 Txs weekly as needed for joint pain   PROBIOTIC-10 PO Take 1 tablet by mouth daily.   Vitamin D-3 25 MCG (1000 UT) Caps Take 1 each by mouth daily.       Allergies:  Allergies  Allergen Reactions  . Celebrex [Celecoxib] Swelling and Rash  . Lactose Intolerance (Gi) Other (See Comments)    Past Medical History, Surgical history, Social history, and Family History were reviewed and updated.  Review of Systems: All other 10 point review of systems is negative.  Physical Exam:  vitals were not taken for this visit.   Wt Readings from Last 3 Encounters:  02/12/20 (!) 309 lb (140.2 kg)  02/02/20 (!) 303 lb (137.4 kg)  01/22/20 (!) 303 lb (137.4 kg)    Ocular: Sclerae unicteric, pupils equal, round and reactive to light Ear-nose-throat: Oropharynx clear, dentition fair Lymphatic: No cervical or supraclavicular adenopathy Lungs no rales or rhonchi, good excursion bilaterally Heart regular rate and rhythm, no murmur appreciated Abd soft, nontender, positive bowel sounds MSK no focal spinal tenderness, no joint edema Neuro: non-focal, well-oriented, appropriate  affect Breasts: Deferred   Lab Results  Component Value Date   WBC 7.3 03/22/2020   HGB 16.0 (H) 03/22/2020   HCT 48.9 (H) 03/22/2020   MCV 90.7 03/22/2020   PLT 204 03/22/2020   Lab Results  Component Value Date   FERRITIN 136.2 03/28/2016   Lab Results  Component Value Date   RBC 5.39 (H) 03/22/2020   No results found for: KPAFRELGTCHN, LAMBDASER, KAPLAMBRATIO No results found for: IGGSERUM, IGA, IGMSERUM No results found for: Odetta Pink, SPEI   Chemistry      Component Value Date/Time   NA 137 12/09/2019 0820   NA 140 09/24/2019 1934   K 4.4 12/09/2019 0820   CL 104 12/09/2019 0820   CO2 26 12/09/2019 0820   BUN 18 12/09/2019 0820   BUN 15 09/24/2019 1934   CREATININE 0.72 12/09/2019 0820   GLU 87 05/21/2018 0000      Component Value Date/Time   CALCIUM 10.0 12/09/2019 0820   ALKPHOS 91 12/09/2019 0820   AST 18 12/09/2019 0820   ALT 13 12/09/2019 0820   BILITOT 0.5 12/09/2019 0820       Impression and Plan: Leslie Knapp is a very pleasant 69yo caucasian femalewith historyof bilateral saddle pulmonary emboli with right heart strain as well as a right brachial artery thrombus.Lab work up did show both a protein C and a protein S deficiency at the time. D-dimer is 0.64.  She will continue her same regimen with Eliquis BID.  I added a JAK 2 genotype to her lab work today to assess for cause of erythrocytosis.  We will also get another US of the left leg to re assess her hematoma.  We will plan to see her again in another 5 months.  She can contact our office with any questions or concerns.   [name], NP 9/13/20213:20 PM

## 2020-03-23 ENCOUNTER — Encounter: Payer: Self-pay | Admitting: Family

## 2020-03-23 LAB — PROTEIN S, TOTAL: Protein S Ag, Total: 107 % (ref 60–150)

## 2020-03-23 LAB — PROTEIN S ACTIVITY: Protein S Activity: 112 % (ref 63–140)

## 2020-03-23 LAB — PROTEIN C ACTIVITY: Protein C Activity: 129 % (ref 73–180)

## 2020-03-24 DIAGNOSIS — M9902 Segmental and somatic dysfunction of thoracic region: Secondary | ICD-10-CM | POA: Diagnosis not present

## 2020-03-24 DIAGNOSIS — M5414 Radiculopathy, thoracic region: Secondary | ICD-10-CM | POA: Diagnosis not present

## 2020-03-24 DIAGNOSIS — M5416 Radiculopathy, lumbar region: Secondary | ICD-10-CM | POA: Diagnosis not present

## 2020-03-24 DIAGNOSIS — M62830 Muscle spasm of back: Secondary | ICD-10-CM | POA: Diagnosis not present

## 2020-03-24 DIAGNOSIS — M542 Cervicalgia: Secondary | ICD-10-CM | POA: Diagnosis not present

## 2020-03-24 DIAGNOSIS — M9903 Segmental and somatic dysfunction of lumbar region: Secondary | ICD-10-CM | POA: Diagnosis not present

## 2020-03-24 DIAGNOSIS — M9901 Segmental and somatic dysfunction of cervical region: Secondary | ICD-10-CM | POA: Diagnosis not present

## 2020-03-24 LAB — PROTEIN C, TOTAL: Protein C, Total: 131 % (ref 60–150)

## 2020-03-25 ENCOUNTER — Ambulatory Visit (INDEPENDENT_AMBULATORY_CARE_PROVIDER_SITE_OTHER): Payer: Medicare Other | Admitting: Obstetrics & Gynecology

## 2020-03-25 ENCOUNTER — Encounter: Payer: Self-pay | Admitting: Obstetrics & Gynecology

## 2020-03-25 ENCOUNTER — Other Ambulatory Visit: Payer: Self-pay

## 2020-03-25 VITALS — BP 123/83 | HR 72 | Ht 70.0 in | Wt 306.0 lb

## 2020-03-25 DIAGNOSIS — I749 Embolism and thrombosis of unspecified artery: Secondary | ICD-10-CM | POA: Diagnosis not present

## 2020-03-25 DIAGNOSIS — R9389 Abnormal findings on diagnostic imaging of other specified body structures: Secondary | ICD-10-CM

## 2020-03-25 NOTE — Progress Notes (Signed)
History:  69 y.o. G0P0000 here today for eval of vaginal discharge that is pink tinged and thickened endometrium on [place]. Pt is concerned about the finding and requests an eval. She was seen by Dr. Nehemiah Settle who did an endobx and referred pt for further eval.  She has a h/o a prev polypectomy.    The following portions of the patient's history were reviewed and updated as appropriate: allergies, current medications, past family history, past medical history, past social history, past surgical history and problem list.  Review of Systems:  Pertinent items are noted in HPI.    Objective:  Physical Exam Blood pressure 123/83, pulse 72, height 5\' 10"  (1.778 m), weight (!) 306 lb (138.8 kg).  CONSTITUTIONAL: Well-developed, well-nourished female in no acute distress.  HENT:  Normocephalic, atraumatic EYES: Conjunctivae and EOM are normal. No scleral icterus.  NECK: Normal range of motion SKIN: Skin is warm and dry. No rash noted. Not diaphoretic.No pallor. [place]: Alert and oriented to person, place, and time. Normal coordination.   Abd: Soft, nontender and nondistended Pelvic: Normal appearing external genitalia; normal appearing vaginal mucosa and cervix.  Normal discharge.  Small uterus, no other palpable masses, no uterine or adnexal tenderness  Labs and Imaging 01/22/2020 FINAL MICROSCOPIC DIAGNOSIS:   A. ENDOMETRIUM, BIOPSY:  - Benign endometrial polyp  - Negative for hyperplasia or malignancy   02/03/2020 CLINICAL DATA:  Abnormal discharge.  EXAM: ULTRASOUND PELVIS TRANSVAGINAL  TECHNIQUE: Transvaginal ultrasound examination of the pelvis was performed including evaluation of the uterus, ovaries, adnexal regions, and pelvic cul-de-sac.  COMPARISON:  None.  FINDINGS: Uterus  Measurements: 5.0 x 3.8 x 3.0 cm = volume: 29 mL. 11 mm fibroid is noted anteriorly in the fundus.  Endometrium  Thickness: 11 mm which is abnormally thickened. No focal  abnormality visualized.  Right ovary  Measurements: 1.2 x 1.0 x 0.9 cm = volume: 1 mL. Normal appearance/no adnexal mass.  Left ovary  Not visualized.  Other findings:  No abnormal free fluid  IMPRESSION: Endometrium is abnormally thickened at 11 mm. Endometrial thickness is considered abnormal for an asymptomatic post-menopausal female. Endometrial sampling should be considered to exclude carcinoma.  Left ovary is not visualized.  Small uterine fibroid is noted. Assessment & Plan:  Endometrial polyp.   Note to Dr. Marin Olp about risks and clearance for  Hysteroscopy wth D&E  Patient desires surgical management with hysteroscopy with D&E and possible polypectomy. The risks of surgery were discussed in detail with the patient including but not limited to: bleeding which may require transfusion or reoperation; infection which may require prolonged hospitalization or re-hospitalization and antibiotic therapy; injury to bowel, bladder, ureters and major vessels or other surrounding organs; need for additional procedures including laparotomy; thromboembolic phenomenon, incisional problems and other postoperative or anesthesia complications.  Patient was told that the likelihood that her condition and symptoms will be treated effectively with this surgical management was very high; the postoperative expectations were also discussed in detail. The patient also understands the alternative treatment options which were discussed in full. All questions were answered.  She was told that she will be contacted by our surgical scheduler regarding the time and date of her surgery; routine preoperative instructions of having nothing to eat or drink after midnight on the day prior to surgery and also coming to the hospital 1 1/2 hours prior to her time of surgery were also emphasized.  She was told she may be called for a preoperative appointment about a week prior to surgery  and will be given  further preoperative instructions at that visit. Printed patient education handouts about the procedure were given to the patient to review at home.  Daltyn Degroat L. Harraway-Smith, M.D., [facility]

## 2020-03-29 ENCOUNTER — Ambulatory Visit (HOSPITAL_BASED_OUTPATIENT_CLINIC_OR_DEPARTMENT_OTHER)
Admission: RE | Admit: 2020-03-29 | Discharge: 2020-03-29 | Disposition: A | Discharge status: Home / Self Care | Payer: Medicare Other | Source: Ambulatory Visit | Attending: Family | Admitting: Family

## 2020-03-29 ENCOUNTER — Other Ambulatory Visit: Payer: Self-pay

## 2020-03-29 DIAGNOSIS — D6859 Other primary thrombophilia: Secondary | ICD-10-CM | POA: Insufficient documentation

## 2020-03-29 DIAGNOSIS — T148XXA Other injury of unspecified body region, initial encounter: Secondary | ICD-10-CM

## 2020-03-29 DIAGNOSIS — M7981 Nontraumatic hematoma of soft tissue: Secondary | ICD-10-CM | POA: Diagnosis not present

## 2020-03-30 ENCOUNTER — Encounter: Payer: Self-pay | Admitting: Family

## 2020-04-01 DIAGNOSIS — M5416 Radiculopathy, lumbar region: Secondary | ICD-10-CM | POA: Diagnosis not present

## 2020-04-01 DIAGNOSIS — M62830 Muscle spasm of back: Secondary | ICD-10-CM | POA: Diagnosis not present

## 2020-04-01 DIAGNOSIS — M9902 Segmental and somatic dysfunction of thoracic region: Secondary | ICD-10-CM | POA: Diagnosis not present

## 2020-04-01 DIAGNOSIS — M9903 Segmental and somatic dysfunction of lumbar region: Secondary | ICD-10-CM | POA: Diagnosis not present

## 2020-04-01 DIAGNOSIS — M542 Cervicalgia: Secondary | ICD-10-CM | POA: Diagnosis not present

## 2020-04-01 DIAGNOSIS — M9901 Segmental and somatic dysfunction of cervical region: Secondary | ICD-10-CM | POA: Diagnosis not present

## 2020-04-01 DIAGNOSIS — M5414 Radiculopathy, thoracic region: Secondary | ICD-10-CM | POA: Diagnosis not present

## 2020-04-02 ENCOUNTER — Other Ambulatory Visit: Payer: Self-pay

## 2020-04-02 ENCOUNTER — Encounter (HOSPITAL_BASED_OUTPATIENT_CLINIC_OR_DEPARTMENT_OTHER): Payer: Self-pay | Admitting: Obstetrics & Gynecology

## 2020-04-02 NOTE — Progress Notes (Signed)
Spoke w/ via phone for pre-op interview---pt Lab needs dos----  I stat 8 and ekg              COVID test ------04-03-2020 1145 Arrive at -------[id number]  04-06-2020 NPO after MN NO Solid Food.  Clear liquids from MN until---1315 then npo Medications to take morning of surgery -----none Diabetic medication -----n/a Patient Special Instructions -----none Pre-Op special Istructions -----none Patient verbalized understanding of instructions that were given at this phone interview. Patient denies shortness of breath, chest pain, fever, cough at this phone interview.  Anesthesia Review:no  PCP:dr Raiford Noble Cardiologist :none Chest x-ray :none EKG :none Echo :03-19-2019 transesophageal care everywhere/chart Stress test:none Cardiac Cath : for pfo closure 03-21-2019 care everywhere/chart lov vascular for hematoma 04-25-2019 care everywherer chart Activity level: can climb steps without problems, does housework Sleep Study/ CPAP :none Fasting Blood Sugar :      / Checks Blood Sugar -- times a day:  n/a Blood Thinner/ Instructions /Last Dose:stop eliquis after Saturday 04-03-2020 doses per dr Marin Olp epic note 90-21-2021 ASA / Instructions/ Last Dose : n/a

## 2020-04-03 ENCOUNTER — Other Ambulatory Visit (HOSPITAL_COMMUNITY)
Admission: RE | Admit: 2020-04-03 | Discharge: 2020-04-03 | Disposition: A | Discharge status: Home / Self Care | Payer: Medicare Other | Source: Ambulatory Visit | Attending: Obstetrics & Gynecology | Admitting: Obstetrics & Gynecology

## 2020-04-03 DIAGNOSIS — Z01812 Encounter for preprocedural laboratory examination: Secondary | ICD-10-CM | POA: Insufficient documentation

## 2020-04-03 DIAGNOSIS — Z20822 Contact with and (suspected) exposure to covid-19: Secondary | ICD-10-CM | POA: Diagnosis not present

## 2020-04-03 LAB — SARS CORONAVIRUS 2 (TAT 6-24 HRS): SARS Coronavirus 2: NEGATIVE

## 2020-04-05 LAB — JAK 2 V617F (GENPATH)

## 2020-04-06 ENCOUNTER — Encounter: Payer: Self-pay | Admitting: *Deleted

## 2020-04-06 ENCOUNTER — Encounter (HOSPITAL_BASED_OUTPATIENT_CLINIC_OR_DEPARTMENT_OTHER): Payer: Self-pay | Admitting: Obstetrics & Gynecology

## 2020-04-06 ENCOUNTER — Ambulatory Visit (HOSPITAL_BASED_OUTPATIENT_CLINIC_OR_DEPARTMENT_OTHER): Payer: Medicare Other | Admitting: Anesthesiology

## 2020-04-06 ENCOUNTER — Ambulatory Visit (HOSPITAL_BASED_OUTPATIENT_CLINIC_OR_DEPARTMENT_OTHER)
Admission: RE | Admit: 2020-04-06 | Discharge: 2020-04-06 | Disposition: A | Discharge status: Home / Self Care | Payer: Medicare Other | Attending: Obstetrics & Gynecology | Admitting: Obstetrics & Gynecology

## 2020-04-06 ENCOUNTER — Encounter (HOSPITAL_BASED_OUTPATIENT_CLINIC_OR_DEPARTMENT_OTHER)
Admission: RE | Disposition: A | Discharge status: Home / Self Care | Payer: Self-pay | Source: Home / Self Care | Attending: Obstetrics & Gynecology

## 2020-04-06 ENCOUNTER — Other Ambulatory Visit: Payer: Self-pay

## 2020-04-06 DIAGNOSIS — K589 Irritable bowel syndrome without diarrhea: Secondary | ICD-10-CM | POA: Insufficient documentation

## 2020-04-06 DIAGNOSIS — Z823 Family history of stroke: Secondary | ICD-10-CM | POA: Diagnosis not present

## 2020-04-06 DIAGNOSIS — R9389 Abnormal findings on diagnostic imaging of other specified body structures: Secondary | ICD-10-CM | POA: Diagnosis not present

## 2020-04-06 DIAGNOSIS — N898 Other specified noninflammatory disorders of vagina: Secondary | ICD-10-CM

## 2020-04-06 DIAGNOSIS — M1990 Unspecified osteoarthritis, unspecified site: Secondary | ICD-10-CM | POA: Insufficient documentation

## 2020-04-06 DIAGNOSIS — E739 Lactose intolerance, unspecified: Secondary | ICD-10-CM | POA: Diagnosis not present

## 2020-04-06 DIAGNOSIS — Z8379 Family history of other diseases of the digestive system: Secondary | ICD-10-CM | POA: Diagnosis not present

## 2020-04-06 DIAGNOSIS — E559 Vitamin D deficiency, unspecified: Secondary | ICD-10-CM | POA: Diagnosis not present

## 2020-04-06 DIAGNOSIS — Z8261 Family history of arthritis: Secondary | ICD-10-CM | POA: Insufficient documentation

## 2020-04-06 DIAGNOSIS — G43909 Migraine, unspecified, not intractable, without status migrainosus: Secondary | ICD-10-CM | POA: Diagnosis not present

## 2020-04-06 DIAGNOSIS — Q211 Atrial septal defect: Secondary | ICD-10-CM | POA: Diagnosis not present

## 2020-04-06 DIAGNOSIS — Z820 Family history of epilepsy and other diseases of the nervous system: Secondary | ICD-10-CM | POA: Insufficient documentation

## 2020-04-06 DIAGNOSIS — Z7901 Long term (current) use of anticoagulants: Secondary | ICD-10-CM | POA: Diagnosis not present

## 2020-04-06 DIAGNOSIS — Z791 Long term (current) use of non-steroidal anti-inflammatories (NSAID): Secondary | ICD-10-CM | POA: Insufficient documentation

## 2020-04-06 DIAGNOSIS — Z6841 Body Mass Index (BMI) 40.0 and over, adult: Secondary | ICD-10-CM | POA: Diagnosis not present

## 2020-04-06 DIAGNOSIS — E7800 Pure hypercholesterolemia, unspecified: Secondary | ICD-10-CM | POA: Diagnosis not present

## 2020-04-06 DIAGNOSIS — K7581 Nonalcoholic steatohepatitis (NASH): Secondary | ICD-10-CM | POA: Diagnosis not present

## 2020-04-06 DIAGNOSIS — Z79899 Other long term (current) drug therapy: Secondary | ICD-10-CM | POA: Insufficient documentation

## 2020-04-06 DIAGNOSIS — Z96653 Presence of artificial knee joint, bilateral: Secondary | ICD-10-CM | POA: Insufficient documentation

## 2020-04-06 DIAGNOSIS — N840 Polyp of corpus uteri: Secondary | ICD-10-CM | POA: Diagnosis not present

## 2020-04-06 DIAGNOSIS — Z8616 Personal history of COVID-19: Secondary | ICD-10-CM | POA: Diagnosis not present

## 2020-04-06 DIAGNOSIS — Z841 Family history of disorders of kidney and ureter: Secondary | ICD-10-CM | POA: Insufficient documentation

## 2020-04-06 DIAGNOSIS — Z538 Procedure and treatment not carried out for other reasons: Secondary | ICD-10-CM

## 2020-04-06 DIAGNOSIS — Z8249 Family history of ischemic heart disease and other diseases of the circulatory system: Secondary | ICD-10-CM | POA: Insufficient documentation

## 2020-04-06 DIAGNOSIS — Z86711 Personal history of pulmonary embolism: Secondary | ICD-10-CM | POA: Diagnosis not present

## 2020-04-06 DIAGNOSIS — N950 Postmenopausal bleeding: Secondary | ICD-10-CM | POA: Diagnosis not present

## 2020-04-06 DIAGNOSIS — Z886 Allergy status to analgesic agent status: Secondary | ICD-10-CM | POA: Diagnosis not present

## 2020-04-06 DIAGNOSIS — D259 Leiomyoma of uterus, unspecified: Secondary | ICD-10-CM

## 2020-04-06 DIAGNOSIS — I700 Atherosclerosis of aorta: Secondary | ICD-10-CM | POA: Diagnosis not present

## 2020-04-06 HISTORY — DX: Localized swelling, mass and lump, lower limb, bilateral: R22.43

## 2020-04-06 HISTORY — DX: Concussion with loss of consciousness status unknown, initial encounter: S06.0XAA

## 2020-04-06 HISTORY — DX: Polyp of corpus uteri: N84.0

## 2020-04-06 HISTORY — DX: Nausea with vomiting, unspecified: R11.2

## 2020-04-06 HISTORY — PX: HYSTEROSCOPY WITH D & C: SHX1775

## 2020-04-06 HISTORY — DX: Concussion with loss of consciousness of unspecified duration, initial encounter: S06.0X9A

## 2020-04-06 HISTORY — DX: Nontoxic single thyroid nodule: E04.1

## 2020-04-06 HISTORY — DX: Other specified postprocedural states: Z98.890

## 2020-04-06 HISTORY — DX: Other complications of anesthesia, initial encounter: T88.59XA

## 2020-04-06 LAB — POCT I-STAT, CHEM 8
BUN: 16 mg/dL (ref 8–23)
Calcium, Ion: 1.13 mmol/L — ABNORMAL LOW (ref 1.15–1.40)
Chloride: 104 mmol/L (ref 98–111)
Creatinine, Ser: 0.6 mg/dL (ref 0.44–1.00)
Glucose, Bld: 95 mg/dL (ref 70–99)
HCT: 46 % (ref 36.0–46.0)
Hemoglobin: 15.6 g/dL — ABNORMAL HIGH (ref 12.0–15.0)
Potassium: 4.5 mmol/L (ref 3.5–5.1)
Sodium: 141 mmol/L (ref 135–145)
TCO2: 25 mmol/L (ref 22–32)

## 2020-04-06 SURGERY — DILATATION AND CURETTAGE /HYSTEROSCOPY
Anesthesia: General

## 2020-04-06 MED ORDER — OXYCODONE HCL 5 MG PO TABS: 5.0000 mg | ORAL_TABLET | Freq: Once | ORAL | Status: DC | PRN

## 2020-04-06 MED ORDER — OXYCODONE HCL 5 MG/5ML PO SOLN: 5.0000 mg | Freq: Once | ORAL | Status: DC | PRN

## 2020-04-06 MED ORDER — LIDOCAINE 2% (20 MG/ML) 5 ML SYRINGE
INTRAMUSCULAR | Status: DC | PRN
  Administered 2020-04-06: 20 mg via INTRAVENOUS

## 2020-04-06 MED ORDER — EPHEDRINE SULFATE-NACL 50-0.9 MG/10ML-% IV SOSY
PREFILLED_SYRINGE | INTRAVENOUS | Status: DC | PRN
  Administered 2020-04-06: 15 mg via INTRAVENOUS

## 2020-04-06 MED ORDER — DEXAMETHASONE SODIUM PHOSPHATE 10 MG/ML IJ SOLN
INTRAMUSCULAR | Status: DC | PRN
  Administered 2020-04-06: 5 mg via INTRAVENOUS

## 2020-04-06 MED ORDER — FENTANYL CITRATE (PF) 100 MCG/2ML IJ SOLN: 25.0000 ug | INTRAMUSCULAR | Status: DC | PRN

## 2020-04-06 MED ORDER — POVIDONE-IODINE 10 % EX SWAB: 2.0000 "application " | Freq: Once | CUTANEOUS | Status: DC

## 2020-04-06 MED ORDER — PROPOFOL 10 MG/ML IV BOLUS
INTRAVENOUS | Status: DC | PRN
  Administered 2020-04-06: 200 mg via INTRAVENOUS
  Administered 2020-04-06: 100 mg via INTRAVENOUS

## 2020-04-06 MED ORDER — LACTATED RINGERS IV SOLN: INTRAVENOUS | Status: DC

## 2020-04-06 MED ORDER — PROMETHAZINE HCL 25 MG/ML IJ SOLN: 6.2500 mg | INTRAMUSCULAR | Status: DC | PRN

## 2020-04-06 MED ORDER — MIDAZOLAM HCL 2 MG/2ML IJ SOLN: 0.5000 mg | Freq: Once | INTRAMUSCULAR | Status: DC | PRN

## 2020-04-06 MED ORDER — ONDANSETRON HCL 4 MG/2ML IJ SOLN
INTRAMUSCULAR | Status: AC
  Filled 2020-04-06: qty 2

## 2020-04-06 MED ORDER — SODIUM CHLORIDE 0.9 % IR SOLN
Status: DC | PRN
  Administered 2020-04-06: 3000 mL

## 2020-04-06 MED ORDER — MEPERIDINE HCL 25 MG/ML IJ SOLN: 6.2500 mg | INTRAMUSCULAR | Status: DC | PRN

## 2020-04-06 MED ORDER — FENTANYL CITRATE (PF) 100 MCG/2ML IJ SOLN
INTRAMUSCULAR | Status: AC
  Filled 2020-04-06: qty 2

## 2020-04-06 MED ORDER — PROPOFOL 10 MG/ML IV BOLUS
INTRAVENOUS | Status: AC
  Filled 2020-04-06: qty 20

## 2020-04-06 MED ORDER — ONDANSETRON HCL 4 MG/2ML IJ SOLN
INTRAMUSCULAR | Status: DC | PRN
  Administered 2020-04-06: 4 mg via INTRAVENOUS

## 2020-04-06 MED ORDER — FENTANYL CITRATE (PF) 100 MCG/2ML IJ SOLN
INTRAMUSCULAR | Status: DC | PRN
Start: 2020-04-06 — End: 2020-04-06
  Administered 2020-04-06 (×2): 50 ug via INTRAVENOUS

## 2020-04-06 MED ORDER — BUPIVACAINE HCL (PF) 0.5 % IJ SOLN
INTRAMUSCULAR | Status: DC | PRN
  Administered 2020-04-06: 20 mL

## 2020-04-06 MED ORDER — LIDOCAINE 2% (20 MG/ML) 5 ML SYRINGE
INTRAMUSCULAR | Status: AC
  Filled 2020-04-06: qty 5

## 2020-04-06 MED ORDER — DEXAMETHASONE SODIUM PHOSPHATE 10 MG/ML IJ SOLN
INTRAMUSCULAR | Status: AC
  Filled 2020-04-06: qty 1

## 2020-04-06 SURGICAL SUPPLY — 14 items
CATH ROBINSON RED A/P 16FR (CATHETERS) ×3 IMPLANT
DEVICE MYOSURE LITE (MISCELLANEOUS) IMPLANT
DEVICE MYOSURE REACH (MISCELLANEOUS) IMPLANT
GLOVE BIO SURGEON STRL SZ7 (GLOVE) ×3 IMPLANT
GLOVE BIOGEL PI IND STRL 7.0 (GLOVE) ×1 IMPLANT
GLOVE BIOGEL PI INDICATOR 7.0 (GLOVE) ×2
GOWN STRL REUS W/TWL LRG LVL3 (GOWN DISPOSABLE) ×3 IMPLANT
KIT PROCEDURE FLUENT (KITS) ×3 IMPLANT
MYOSURE XL FIBROID (MISCELLANEOUS)
PACK VAGINAL MINOR WOMEN LF (CUSTOM PROCEDURE TRAY) ×3 IMPLANT
PAD OB MATERNITY 4.3X12.25 (PERSONAL CARE ITEMS) ×3 IMPLANT
SEAL CERVICAL OMNI LOK (ABLATOR) IMPLANT
SEAL ROD LENS SCOPE MYOSURE (ABLATOR) ×3 IMPLANT
SYSTEM TISS REMOVAL MYOSURE XL (MISCELLANEOUS) IMPLANT

## 2020-04-06 NOTE — Discharge Instructions (Signed)
Post Anesthesia Home Care Instructions  Activity: Get plenty of rest for the remainder of the day. A responsible individual must stay with you for 24 hours following the procedure.  For the next 24 hours, DO NOT: -Drive a car -Paediatric nurse -Drink alcoholic beverages -Take any medication unless instructed by your physician -Make any legal decisions or sign important papers.  Meals: Start with liquid foods such as gelatin or soup. Progress to regular foods as tolerated. Avoid greasy, spicy, heavy foods. If nausea and/or vomiting occur, drink only clear liquids until the nausea and/or vomiting subsides. Call your physician if vomiting continues.  Special Instructions/Symptoms: Your throat may feel dry or sore from the anesthesia or the breathing tube placed in your throat during surgery. If this causes discomfort, gargle with warm salt water. The discomfort should disappear within 24 hours.  If you had a scopolamine patch placed behind your ear for the management of post- operative nausea and/or vomiting:  1. The medication in the patch is effective for 72 hours, after which it should be removed.  Wrap patch in a tissue and discard in the trash. Wash hands thoroughly with soap and water. 2. You may remove the patch earlier than 72 hours if you experience unpleasant side effects which may include dry mouth, dizziness or visual disturbances. 3. Avoid touching the patch. Wash your hands with soap and water after contact with the patch.    Hysteroscopy, Care After This sheet gives you information about how to care for yourself after your procedure. Your health care provider may also give you more specific instructions. If you have problems or questions, contact your health care provider. What can I expect after the procedure? After the procedure, it is common to have:  Cramping.  Bleeding. This can vary from light spotting to menstrual-like bleeding. Follow these instructions at  home: Activity  Rest for 1-2 days after the procedure.  Do not douche, use tampons, or have sex for 2 weeks after the procedure, or until your health care provider approves.  Do not drive for 24 hours after the procedure, or for as long as told by your health care provider.  Do not drive, use heavy machinery, or drink alcohol while taking prescription pain medicines. Medicines   Take over-the-counter and prescription medicines only as told by your health care provider.  Do not take aspirin during recovery. It can increase the risk of bleeding. General instructions  Do not take baths, swim, or use a hot tub until your health care provider approves. Take showers instead of baths for 2 weeks, or for as long as told by your health care provider.  To prevent or treat constipation while you are taking prescription pain medicine, your health care provider may recommend that you: ? Drink enough fluid to keep your urine clear or pale yellow. ? Take over-the-counter or prescription medicines. ? Eat foods that are high in fiber, such as fresh fruits and vegetables, whole grains, and beans. ? Limit foods that are high in fat and processed sugars, such as fried and sweet foods.  Keep all follow-up visits as told by your health care provider. This is important. Contact a health care provider if:  You feel dizzy or lightheaded.  You feel nauseous.  You have abnormal vaginal discharge.  You have a rash.  You have pain that does not get better with medicine.  You have chills. Get help right away if:  You have bleeding that is heavier than a normal menstrual  period.  You have a fever.  You have pain or cramps that get worse.  You develop new abdominal pain.  You faint.  You have pain in your shoulders.  You have shortness of breath. Summary  After the procedure, you may have cramping and some vaginal bleeding.  Do not douche, use tampons, or have sex for 2 weeks after the  procedure, or until your health care provider approves.  Do not take baths, swim, or use a hot tub until your health care provider approves. Take showers instead of baths for 2 weeks, or for as long as told by your health care provider.  Report any unusual symptoms to your health care provider.  Keep all follow-up visits as told by your health care provider. This is important. This information is not intended to replace advice given to you by your health care provider. Make sure you discuss any questions you have with your health care provider. Document Revised: 06/08/2017 Document Reviewed: 07/25/2016 Elsevier Patient Education  [place].

## 2020-04-06 NOTE — H&P (Signed)
Preoperative History and Physical  Leslie Knapp is a 69 y.o. G0P0000 here for surgical management of post menopausal bleeding and thickened endometrium.   Proposed surgery: hysteroscopy with dilation and curreatge and resection of polyp or fibroid.    Past Medical History:  Diagnosis Date  . Acute saddle pulmonary embolism ([place]) 06/2019  . Complication of anesthesia    wants neck supported during surgery due to hx of migraine caused by concussion  . Concussion 2010, 2000 and 1998   periodiotic migraine and occ dizzines  . COVID-19 09/2019   sx covid pneumonia,fever, chills body aches, took monoclonal antibody tx done symptoms resolved after 6 weeks  . CTS (carpal tunnel syndrome)   . Fatty liver disease, nonalcoholic    In [facility] Study liver biopsy 2010 did not show fatty liver  . History of blood transfusion 1975   after knee surgery  . IBS (irritable bowel syndrome)    C/D  . Localized swelling of both lower legs    go down with propping up of legs  . Morbid obesity ([place])   . Osteoarthritis    oa  . Pneumonia 09/2019  . PONV (postoperative nausea and vomiting)    watch neck position needs support or gets n/v  . Thyroid nodule    sees ent dr Radene Journey for q year  . Uterine polyp    Past Surgical History:  Procedure Laterality Date  . CARDIAC CATHETERIZATION  03/21/2019   with pfo closure  . IVC FILTER INSERTION  07/07/2019  . KNEE SURGERY  1975   Left Patella Tendon Replacement  . LIVER BIOPSY  2010  . PATENT FORAMEN OVALE(PFO) CLOSURE  03/21/2019  . TONSILLECTOMY  1963  . 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 TOTAL KNEE ARTHROPLASTY  11.2005   Right  . UTERINE POLYP REMOVAL  2010   OB History    Gravida  0   Para  0   Term  0   Preterm  0   AB  0   Living  0     SAB  0   TAB  0   Ectopic  0   Multiple  0   Live Births  0          Patient denies any cervical dysplasia or STIs. Medications Prior to Admission  Medication Sig  Dispense Refill Last Dose  . Bisacodyl (GENTLE LAXATIVE PO) Take by mouth.   Past Week at Unknown time  . Cholecalciferol (VITAMIN D-3) 1000 UNITS CAPS Take 1 each by mouth daily. Liquid d 3 5 drops per day   Past Week at Unknown time  . Digestive Enzymes (ENZYME DIGEST) CAPS Take by mouth as needed.    Past Week at Unknown time  . Probiotic Product (PROBIOTIC-10 PO) Take 1 tablet by mouth daily.    Past Week at Unknown time  . Trace Min [id number] (MINERALS PO) Take 1 oz by mouth daily.   Past Week at Unknown time  . ELIQUIS 5 MG TABS tablet Take 1 tablet by mouth twice daily 60 tablet 0 04/03/2020  . ibuprofen (ADVIL) 400 MG tablet Take 400 mg by mouth every 6 (six) hours as needed.   04/04/2020  . NON FORMULARY ROLFING: Deep Tissue Massage, 2 Txs weekly as needed for joint pain   Unknown at Unknown time    Allergies  Allergen Reactions  . Celebrex [Celecoxib] Swelling and Rash  . Lactose Intolerance (Gi) Other (See Comments)   Social  History:   reports that she has never smoked. She has never used smokeless tobacco. She reports that she does not drink alcohol and does not use drugs. Family History  Problem Relation Age of Onset  . Stroke Mother 51       Deceased  . Hypertension Mother   . GI problems Mother   . Arthritis Mother   . Dementia Mother   . Heart defect Father 47       Deceased  . Parkinson's disease Father   . Varicose Veins Father   . Stroke Maternal Grandfather   . Arthritis Maternal Grandfather   . Kidney failure Maternal Grandmother   . Gallbladder disease Maternal Grandmother   . Diverticulitis Sister        #1  . Arthritis Sister   . Healthy Sister        #2    Review of Systems: Noncontributory  PHYSICAL EXAM: Blood pressure 135/82, pulse 84, temperature 98.3 F (36.8 C), temperature source Oral, resp. rate 18, height 5\' 10"  (1.778 m), weight (!) 138.7 kg, SpO2 98 %. General appearance - alert, well appearing, and in no distress Chest - clear  to auscultation, no wheezes, rales or rhonchi, symmetric air entry Heart - normal rate and regular rhythm Abdomen - soft, nontender, nondistended, no masses or organomegaly Pelvic - examination not indicated Extremities - peripheral pulses normal, no pedal edema, no clubbing or cyanosis  Labs: Results for orders placed or performed during the hospital encounter of 04/06/20 (from the past 336 hour(s))  I-STAT, chem 8   Collection Time: 04/06/20 11:57 AM  Result Value Ref Range   Sodium 141 135 - 145 mmol/L   Potassium 4.5 3.5 - 5.1 mmol/L   Chloride 104 98 - 111 mmol/L   BUN 16 8 - 23 mg/dL   Creatinine, Ser 0.60 0.44 - 1.00 mg/dL   Glucose, Bld 95 70 - 99 mg/dL   Calcium, Ion 1.13 (L) 1.15 - 1.40 mmol/L   TCO2 25 22 - 32 mmol/L   Hemoglobin 15.6 (H) 12.0 - 15.0 g/dL   HCT 46.0 36 - 46 %  Results for orders placed or performed during the hospital encounter of 04/03/20 (from the past 336 hour(s))  SARS CORONAVIRUS 2 (TAT 6-24 HRS) Nasopharyngeal Nasopharyngeal Swab   Collection Time: 04/03/20 11:48 AM   Specimen: Nasopharyngeal Swab  Result Value Ref Range   SARS Coronavirus 2 NEGATIVE NEGATIVE    Imaging Studies:02/03/2020 CLINICAL DATA:  Abnormal discharge.  EXAM: ULTRASOUND PELVIS TRANSVAGINAL  TECHNIQUE: Transvaginal ultrasound examination of the pelvis was performed including evaluation of the uterus, ovaries, adnexal regions, and pelvic cul-de-sac.  COMPARISON:  None.  FINDINGS: Uterus  Measurements: 5.0 x 3.8 x 3.0 cm = volume: 29 mL. 11 mm fibroid is noted anteriorly in the fundus.  Endometrium  Thickness: 11 mm which is abnormally thickened. No focal abnormality visualized.  Right ovary  Measurements: 1.2 x 1.0 x 0.9 cm = volume: 1 mL. Normal appearance/no adnexal mass.  Left ovary  Not visualized.  Other findings:  No abnormal free fluid  IMPRESSION: Endometrium is abnormally thickened at 11 mm. Endometrial thickness is considered  abnormal for an asymptomatic post-menopausal female. Endometrial sampling should be considered to exclude carcinoma.  Left ovary is not visualized.  Small uterine fibroid is noted. Assessment: Patient Active Problem List   Diagnosis Date Noted  . 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 Aortic atherosclerosis (  [place]) 03/17/2019  . 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 Abdominal pain 03/08/2019  . Pure hypercholesterolemia 09/25/2017  . Morbid obesity ([place]) 06/07/2017  . Encounter for Medicare annual wellness exam 06/07/2017  . Vitamin D deficiency 06/07/2017  . IBS (irritable bowel syndrome) 08/29/2016  . Migraine 08/29/2016  . Disorder of musculoskeletal system 03/23/2015  . Eye exam abnormal 03/23/2015  . NASH (nonalcoholic steatohepatitis) 10/19/2011    Plan: Patient will undergo surgical management with  hysteroscopy with dilation and curreatge and resection of polyp or fibroid.  The risks of surgery were discussed in detail with the patient including but not limited to: bleeding which may require transfusion or reoperation; infection which may require antibiotics; injury to surrounding organs which may involve bowel, bladder, ureters ; need for additional procedures including laparoscopy or laparotomy; thromboembolic phenomenon, surgical site problems and other postoperative/anesthesia complications. Likelihood of success in alleviating the patient's condition was discussed. Routine postoperative instructions will be reviewed with the patient and her family in detail after surgery.  The patient concurred with the proposed plan, giving informed written consent for the surgery.  Patient has been NPO since last night she will remain NPO for procedure.  Anesthesia and OR aware.  Preoperative prophylactic antibiotics and SCDs ordered on call to the OR.  To OR when ready.  Vickii Volland L. Ihor Dow, M.D.,  [facility] 04/06/2020 12:59 PM

## 2020-04-06 NOTE — Anesthesia Procedure Notes (Signed)
Procedure Name: LMA Insertion Date/Time: 04/06/2020 1:52 PM Performed by: Bonney Aid, CRNA Pre-anesthesia Checklist: Patient identified, Emergency Drugs available, Suction available and Patient being monitored Patient Re-evaluated:Patient Re-evaluated prior to induction Oxygen Delivery Method: Circle system utilized Preoxygenation: Pre-oxygenation with 100% oxygen Induction Type: IV induction Ventilation: Mask ventilation without difficulty LMA: LMA inserted LMA Size: 4.0 Number of attempts: 1 Airway Equipment and Method: Bite block Placement Confirmation: positive ETCO2 Tube secured with: Tape Dental Injury: Teeth and Oropharynx as per pre-operative assessment

## 2020-04-06 NOTE — Transfer of Care (Signed)
Immediate Anesthesia Transfer of Care Note  Patient: Leslie Knapp  Procedure(s) Performed: DILATATION AND CURETTAGE /HYSTEROSCOPY (N/A )  Patient Location: PACU  Anesthesia Type:General  Level of Consciousness: drowsy  Airway & Oxygen Therapy: Patient Spontanous Breathing and Patient connected to face mask oxygen  Post-op Assessment: Report given to RN  Post vital signs: Reviewed and stable  Last Vitals:  Vitals Value Taken Time  BP 126/76 04/06/20 1430  Temp 36.3 C 04/06/20 1430  Pulse 82 04/06/20 1430  Resp 20 04/06/20 1430  SpO2 99 % 04/06/20 1430  Vitals shown include unvalidated device data.  Last Pain:  Vitals:   04/06/20 1430  TempSrc: Oral         Complications: No complications documented.

## 2020-04-06 NOTE — Anesthesia Preprocedure Evaluation (Addendum)
Anesthesia Evaluation  Patient identified by MRN, date of birth, ID band Patient awake    Reviewed: Allergy & Precautions, NPO status , Patient's Chart, lab work & pertinent test results  History of Anesthesia Complications (+) PONV  Airway Mallampati: I  TM Distance: >3 FB Neck ROM: Full    Dental  (+) Dental Advisory Given, Chipped   Pulmonary PE (06/2019) 09/2019 COVID -19 04/03/2020 SARS coronavirus NEG   breath sounds clear to auscultation       Cardiovascular + Valvular Problems/Murmurs (s/p PFO closure 2020)  Rhythm:Regular Rate:Normal  '19 ECHO: EF 55-60%, Normal LVEF. MIld LVH. Trace AR   Neuro/Psych  Headaches,    GI/Hepatic negative GI ROS, (+) Hepatitis - ([facility])  Endo/Other  Morbid obesity  Renal/GU negative Renal ROS     Musculoskeletal  (+) Arthritis ,   Abdominal (+) + obese,   Peds  Hematology  (+) Blood dyscrasia (eliquis: last dose Sat), ,   Anesthesia Other Findings   Reproductive/Obstetrics                            Anesthesia Physical Anesthesia Plan  ASA: III  Anesthesia Plan: General   Post-op Pain Management:    Induction: Intravenous  PONV Risk Score and Plan: 4 or greater and Ondansetron, Dexamethasone and Treatment may vary due to age or medical condition  Airway Management Planned: LMA  Additional Equipment: None  Intra-op Plan:   Post-operative Plan:   Informed Consent: I have reviewed the patients History and Physical, chart, labs and discussed the procedure including the risks, benefits and alternatives for the proposed anesthesia with the patient or authorized representative who has indicated his/her understanding and acceptance.     Dental advisory given  Plan Discussed with: CRNA and Surgeon  Anesthesia Plan Comments:        Anesthesia Quick Evaluation

## 2020-04-06 NOTE — Anesthesia Postprocedure Evaluation (Signed)
Anesthesia Post Note  Patient: Leslie Knapp  Procedure(s) Performed: DILATATION AND CURETTAGE /HYSTEROSCOPY (N/A )     Patient location during evaluation: PACU Anesthesia Type: General Level of consciousness: awake and alert, patient cooperative and oriented Pain management: pain level controlled Vital Signs Assessment: post-procedure vital signs reviewed and stable Respiratory status: spontaneous breathing, nonlabored ventilation and respiratory function stable Cardiovascular status: blood pressure returned to baseline and stable Postop Assessment: no apparent nausea or vomiting Anesthetic complications: no   No complications documented.  Last Vitals:  Vitals:   04/06/20 1445 04/06/20 1500  BP: 127/77 128/80  Pulse: 78 78  Resp: 14 14  Temp:    SpO2: 100% 100%    Last Pain:  Vitals:   04/06/20 1430  TempSrc: Oral                 Athanasia Stanwood,E. Bryden Darden

## 2020-04-07 ENCOUNTER — Encounter (HOSPITAL_BASED_OUTPATIENT_CLINIC_OR_DEPARTMENT_OTHER): Payer: Self-pay | Admitting: Obstetrics & Gynecology

## 2020-04-07 DIAGNOSIS — N898 Other specified noninflammatory disorders of vagina: Secondary | ICD-10-CM

## 2020-04-07 DIAGNOSIS — R9389 Abnormal findings on diagnostic imaging of other specified body structures: Secondary | ICD-10-CM

## 2020-04-07 LAB — SURGICAL PATHOLOGY

## 2020-04-07 NOTE — Op Note (Signed)
INDICATIONS: 69 y.o. G0P0000  here for scheduled surgery for abnormal vaginal discharge possible post menopausal bleeding and possible endometrial polyp. Risks of surgery were discussed with the patient including but not limited to: bleeding which may require transfusion; infection which may require antibiotics; injury to uterus or surrounding organs; intrauterine scarring which may impair future fertility; need for additional procedures including laparotomy or laparoscopy; and other postoperative/anesthesia complications. Written informed consent was obtained.    FINDINGS:  A small mobile uterus.  The endometrium did not appear to be directly visualized. There was a fibroid blocking the entrance and a tract to  The cavity could not be identified. A false tract was created and a large amount of fluid (200cc) was immediately noted so the procedure was aborted.     ANESTHESIA:   General, paracervical block. FLUID DEFICITS:  333ml of Glycine ESTIMATED BLOOD LOSS:  Less than 20 ml SPECIMENS: Endometrial curettings sent to pathology COMPLICATIONS:  None immediate.  PROCEDURE DETAILS:  The patient was taken to the operating room where general anesthesia was administered with LMA and was found to be adequate.  After an adequate timeout was performed, she was placed in the dorsal lithotomy position and examined; then prepped and draped in the sterile manner.   Her bladder was catheterized for an unmeasured amount of clear, yellow urine. A speculum was then placed in the patient's vagina and a single tooth tenaculum was applied to the anterior lip of the cervix.   The cervix was sounded to 6cm and dilated manually with metal dilators to accommodate the 5 mm diagnostic hysteroscope.  Once the cervix was dilated, the hysteroscope was inserted under direct visualization using 1.5% glycine as a suspension medium.  The uterine cavity was NOT directly visualized. After further careful evaluation and probing the tract to  the cavity was not seen. A false tract was created and the procedure was aborted due to belief of perforation. I believe we were in the lower uterine segment however, the ostia were not noted and I could not confirm this. A sharp curettage was then performed to obtain a moderate amount of endometrial curettings.  The tenaculum was removed from the anterior lip of the cervix and the vaginal speculum was removed after noting good hemostasis.  The patient tolerated the procedure well and was taken to the recovery area awake, extubated and in stable condition.  The patient will be discharged to home as per PACU criteria.  Routine postoperative instructions given.  She will follow up in the clinic in 2-4  weeks for postoperative evaluation.  Leslie Knapp, M.D., Cherlynn June

## 2020-04-08 ENCOUNTER — Other Ambulatory Visit: Payer: Medicare Other

## 2020-04-08 ENCOUNTER — Ambulatory Visit: Payer: Medicare Other | Admitting: Family

## 2020-04-09 ENCOUNTER — Telehealth: Payer: Self-pay

## 2020-04-09 ENCOUNTER — Ambulatory Visit: Payer: Medicare Other | Admitting: Family

## 2020-04-09 ENCOUNTER — Other Ambulatory Visit: Payer: Medicare Other

## 2020-04-09 ENCOUNTER — Telehealth: Payer: Self-pay | Admitting: Obstetrics & Gynecology

## 2020-04-09 NOTE — Telephone Encounter (Signed)
Called patient no answer, left message to ignore surgery letter that is coming in the mail, she was scheduled in error

## 2020-04-09 NOTE — Telephone Encounter (Signed)
TC to pt to discuss surg path from procedure. No answer on home or cell numbers. Left message to call back.   Dr. Ihor Dow

## 2020-04-09 NOTE — Telephone Encounter (Signed)
-----   Message from Lavonia Drafts, MD sent at 04/09/2020  8:30 AM EDT ----- Regarding: RE: Surgery 11/02 THIS IS THE WRONG PT!!!!! MY ERROR!!!!!  PLEASE CANCEL THIS!    Clh-S ----- Message ----- From: Francia Greaves Sent: 04/08/2020   2:21 PM EDT To: Kandice Robinsons, MD, # Subject: Surgery 11/02                                  Done. Posted for 05/11/2020 @1300  w/ CHS & Rosana Hoes CPT 56153 Dx Maryln Gottron, please make sure that MRI gets scheduled ----- Message ----- From: Lavonia Drafts, MD Sent: 04/07/2020   5:03 PM EDT To: Olegario Shearer Battle  Please schedule pt for TAH with bilateral salpingectomy. Large uterine fibroids.   Needs MRI prior to procedure. (ordered).   Clh-S

## 2020-04-13 ENCOUNTER — Encounter: Payer: Self-pay | Admitting: Family

## 2020-04-14 ENCOUNTER — Other Ambulatory Visit: Payer: Self-pay | Admitting: *Deleted

## 2020-04-14 DIAGNOSIS — I2602 Saddle embolus of pulmonary artery with acute cor pulmonale: Secondary | ICD-10-CM

## 2020-04-14 DIAGNOSIS — I749 Embolism and thrombosis of unspecified artery: Secondary | ICD-10-CM

## 2020-04-14 MED ORDER — APIXABAN 5 MG PO TABS: 5.0000 mg | ORAL_TABLET | Freq: Two times a day (BID) | ORAL | 3 refills | Status: DC

## 2020-04-15 ENCOUNTER — Other Ambulatory Visit: Payer: Medicare Other

## 2020-04-15 ENCOUNTER — Ambulatory Visit: Payer: Medicare Other | Admitting: Family

## 2020-04-15 ENCOUNTER — Other Ambulatory Visit: Payer: Self-pay | Admitting: *Deleted

## 2020-04-15 DIAGNOSIS — I749 Embolism and thrombosis of unspecified artery: Secondary | ICD-10-CM

## 2020-04-15 DIAGNOSIS — I2602 Saddle embolus of pulmonary artery with acute cor pulmonale: Secondary | ICD-10-CM

## 2020-04-15 MED ORDER — APIXABAN 5 MG PO TABS: 5.0000 mg | ORAL_TABLET | Freq: Two times a day (BID) | ORAL | 3 refills | Status: DC

## 2020-04-28 ENCOUNTER — Encounter: Payer: Self-pay | Admitting: Physician Assistant

## 2020-05-05 ENCOUNTER — Encounter: Payer: Self-pay | Admitting: Family

## 2020-05-10 ENCOUNTER — Other Ambulatory Visit: Payer: Self-pay

## 2020-05-10 ENCOUNTER — Ambulatory Visit (INDEPENDENT_AMBULATORY_CARE_PROVIDER_SITE_OTHER): Payer: Medicare Other | Admitting: Obstetrics & Gynecology

## 2020-05-10 ENCOUNTER — Encounter: Payer: Self-pay | Admitting: Obstetrics & Gynecology

## 2020-05-10 VITALS — BP 131/79 | HR 82 | Wt 305.0 lb

## 2020-05-10 DIAGNOSIS — Z9889 Other specified postprocedural states: Secondary | ICD-10-CM

## 2020-05-10 NOTE — Progress Notes (Signed)
Patient is five weeks post op: D&C. Marland KitchenKathrene Alu RN

## 2020-05-10 NOTE — Progress Notes (Signed)
History:  69 y.o.LMP here today for 5 week post op check.Pt is s/p hysteroscopy with D&C on 04/06/2020  Pt reports that she is doing well. She is eating and passing stools without difficulty. She reports that she had bleeding post procedure for 2 days then mucous for 10 days. All of her sx have resolved currently.       The following portions of the patient's history were reviewed and updated as appropriate: allergies, current medications, past family history, past medical history, past social history, past surgical history and problem list.  Review of Systems:  Pertinent items are noted in HPI.    Objective:  Physical Exam BP 131/79   Pulse 82   Wt (!) 305 lb (138.3 kg)   BMI 43.76 kg/m   CONSTITUTIONAL: Well-developed, well-nourished female in no acute distress.  HENT:  Normocephalic, atraumatic EYES: Conjunctivae and EOM are normal. No scleral icterus.  NECK: Normal range of motion SKIN: Skin is warm and dry. No rash noted. Not diaphoretic.No pallor. [place]: Alert and oriented to person, place, and time. Normal coordination.  Pelvic: deferred  Labs and Imaging Surg path 04/06/2020 ENDOMETRIUM, CURETTAGE:  - Endometrial type tissue with features suggestive of polyp, scant,  benign.   Assessment & Plan:  5 week post op check following  hysteroscopy with D&C. I reviewed the case with the pt and the difficulty seeing her endometrium. The path from the [facility] showed a polyp. Her preop sx have completely resolved. Reviewed Megace for 3 months vs observation. Pt opts for observation at present.   Doing well  Reviewed her surg path.   Reviewed post op instructions and activities  Gradual increase in activities  All questions answered.   Birdie Beveridge L. Harraway-Smith, M.D., Cherlynn June

## 2020-05-11 ENCOUNTER — Inpatient Hospital Stay: Admit: 2020-05-11 | Payer: Medicare Other | Admitting: Obstetrics & Gynecology

## 2020-05-11 ENCOUNTER — Encounter: Payer: Self-pay | Admitting: Physician Assistant

## 2020-05-11 SURGERY — HYSTERECTOMY, TOTAL, ABDOMINAL, WITH SALPINGECTOMY
Anesthesia: Choice

## 2020-05-12 NOTE — Telephone Encounter (Signed)
For completed and signed. Ready for fax or pickup.

## 2020-05-13 ENCOUNTER — Other Ambulatory Visit: Payer: Self-pay

## 2020-05-13 DIAGNOSIS — R9389 Abnormal findings on diagnostic imaging of other specified body structures: Secondary | ICD-10-CM

## 2020-05-13 DIAGNOSIS — N840 Polyp of corpus uteri: Secondary | ICD-10-CM

## 2020-05-13 IMAGING — MG DIGITAL SCREENING BILAT W/ TOMO W/ CAD
6 of 10 series · 6 of 30 positions shown · non-contrast
Comparison: Previous exam(s).

CLINICAL DATA: Screening.

EXAM:
DIGITAL SCREENING BILATERAL MAMMOGRAM WITH TOMO AND CAD

[R MLO synth-2D]
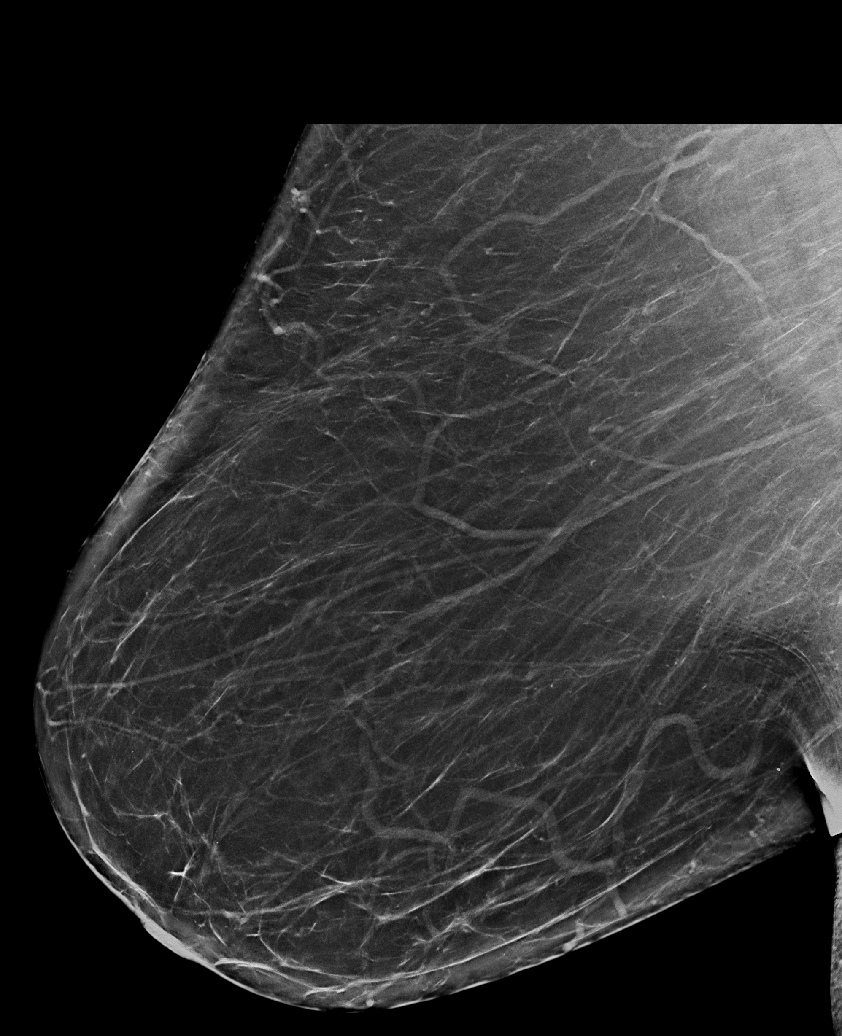

[L CV synth-2D]
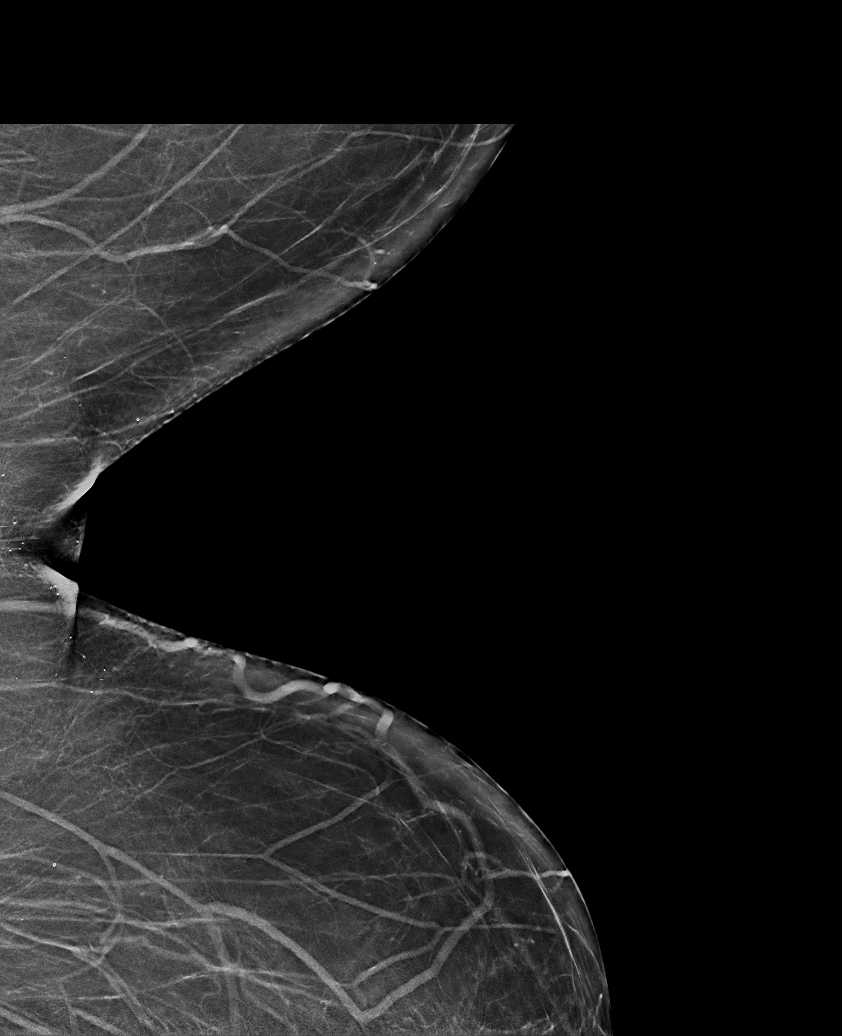

[R CC synth-2D]
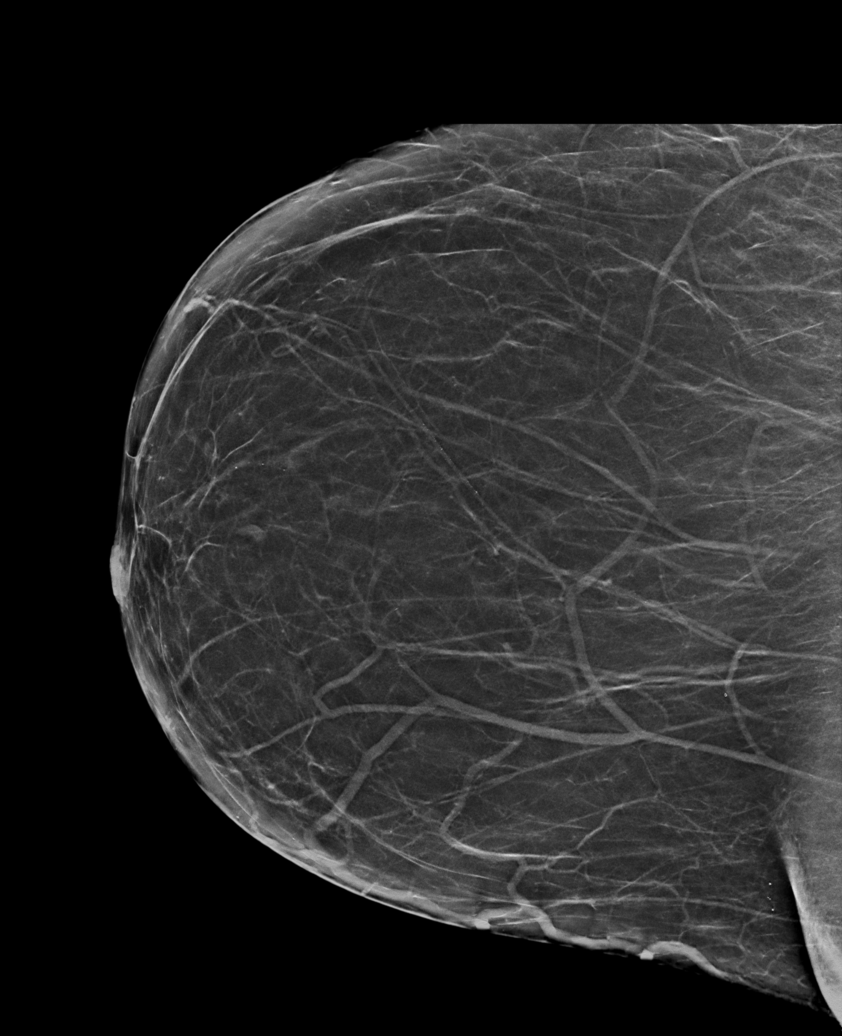

[L CC synth-2D]
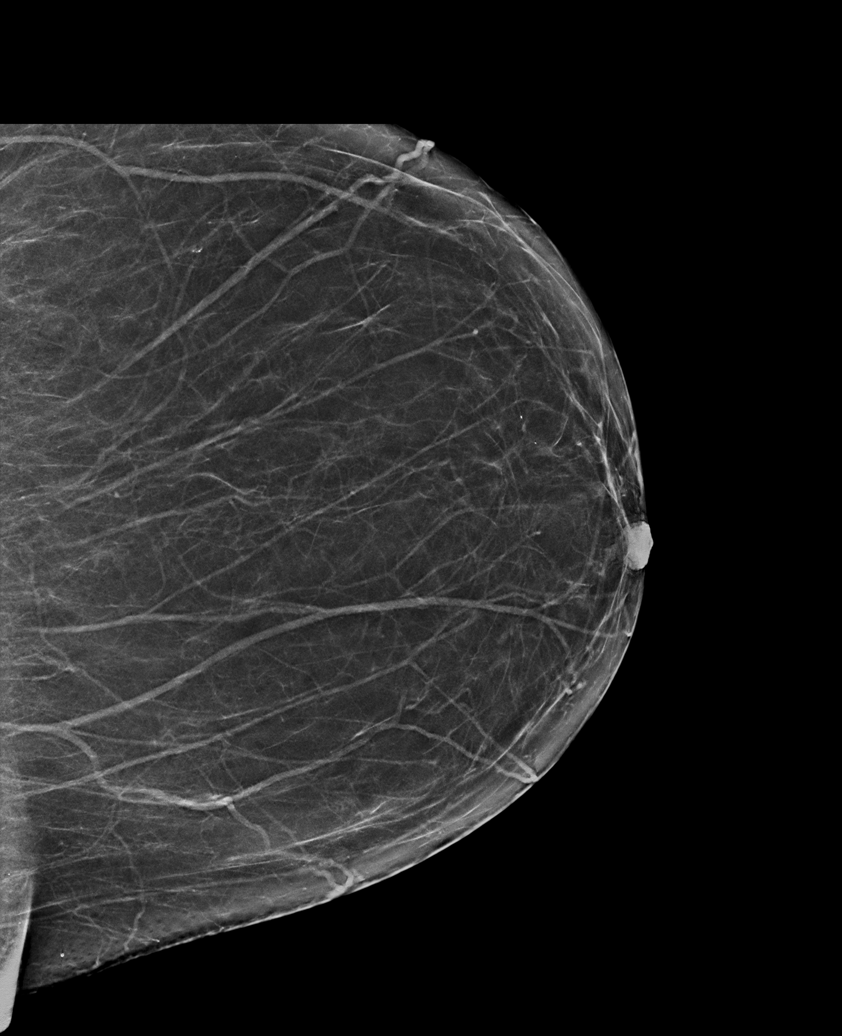

[L MLO synth-2D]
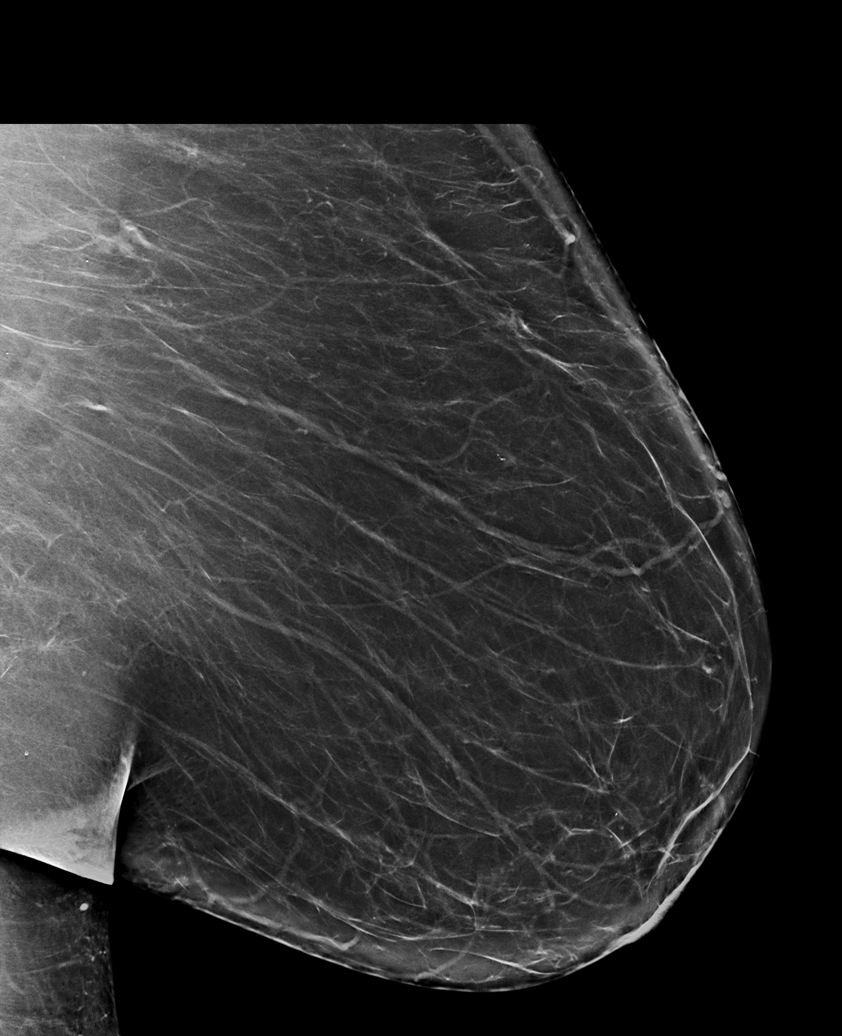

[L CV tomo · tomo slice 31/60.0]
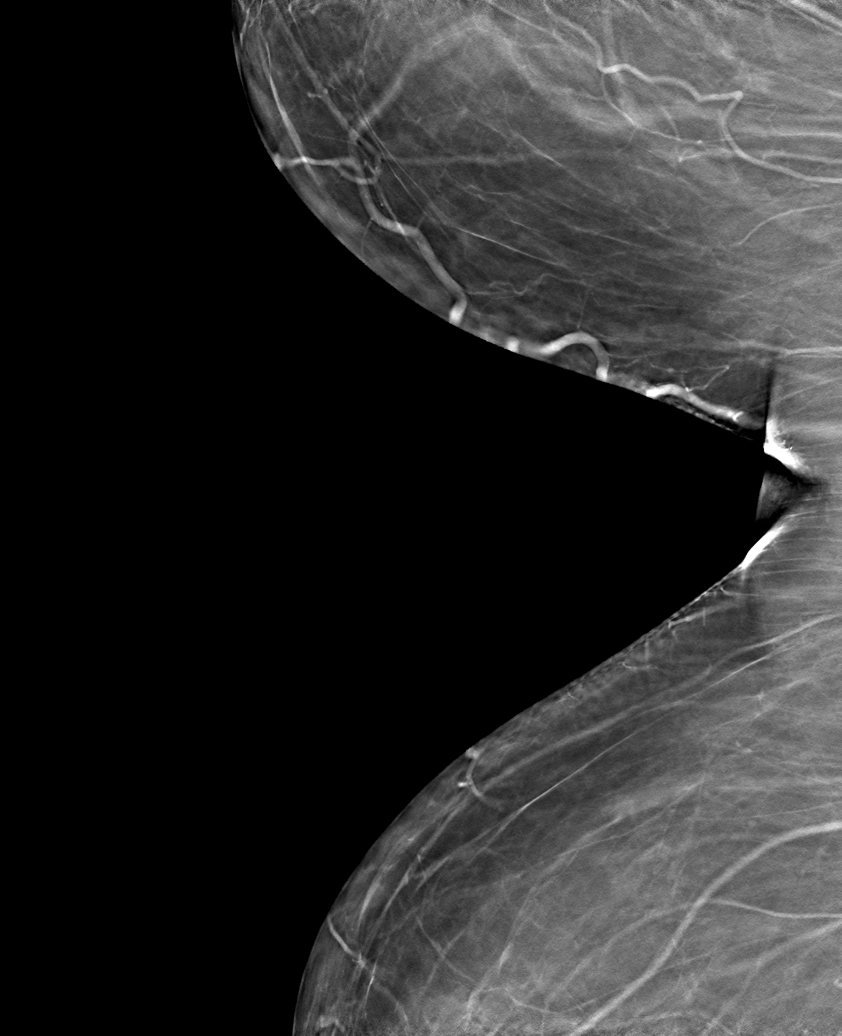

[6 of 30 positions shown; findings below may reference images not displayed]

ACR Breast Density Category b: There are scattered areas of
fibroglandular density.
FINDINGS: There are no findings suspicious for malignancy. Images were
processed with CAD.
IMPRESSION: No mammographic evidence of malignancy. A result letter of this
screening mammogram will be mailed directly to the patient.

RECOMMENDATION:
Screening mammogram in one year. (Code:[id number])

BI-RADS CATEGORY  1: Negative.

## 2020-05-13 NOTE — Progress Notes (Signed)
Orders placed for follow up ultrasound. Kathrene Alu RN

## 2020-05-25 ENCOUNTER — Other Ambulatory Visit (HOSPITAL_BASED_OUTPATIENT_CLINIC_OR_DEPARTMENT_OTHER): Payer: Medicare Other

## 2020-06-28 ENCOUNTER — Other Ambulatory Visit: Payer: Self-pay

## 2020-06-28 ENCOUNTER — Ambulatory Visit (HOSPITAL_BASED_OUTPATIENT_CLINIC_OR_DEPARTMENT_OTHER)
Admission: RE | Admit: 2020-06-28 | Discharge: 2020-06-28 | Disposition: A | Discharge status: Home / Self Care | Payer: Medicare Other | Source: Ambulatory Visit | Attending: Obstetrics & Gynecology | Admitting: Obstetrics & Gynecology

## 2020-06-28 DIAGNOSIS — R9389 Abnormal findings on diagnostic imaging of other specified body structures: Secondary | ICD-10-CM

## 2020-06-28 DIAGNOSIS — N840 Polyp of corpus uteri: Secondary | ICD-10-CM

## 2020-07-01 ENCOUNTER — Other Ambulatory Visit: Payer: Self-pay

## 2020-07-01 ENCOUNTER — Ambulatory Visit (HOSPITAL_BASED_OUTPATIENT_CLINIC_OR_DEPARTMENT_OTHER)
Admission: RE | Admit: 2020-07-01 | Discharge: 2020-07-01 | Disposition: A | Discharge status: Home / Self Care | Payer: Medicare Other | Source: Ambulatory Visit | Attending: Obstetrics & Gynecology | Admitting: Obstetrics & Gynecology

## 2020-07-01 DIAGNOSIS — R9389 Abnormal findings on diagnostic imaging of other specified body structures: Secondary | ICD-10-CM | POA: Diagnosis not present

## 2020-07-01 DIAGNOSIS — N840 Polyp of corpus uteri: Secondary | ICD-10-CM | POA: Diagnosis not present

## 2020-07-01 DIAGNOSIS — D259 Leiomyoma of uterus, unspecified: Secondary | ICD-10-CM | POA: Diagnosis not present

## 2020-07-15 DIAGNOSIS — R059 Cough, unspecified: Secondary | ICD-10-CM | POA: Diagnosis not present

## 2020-07-19 ENCOUNTER — Encounter: Payer: Self-pay | Admitting: Physician Assistant

## 2020-07-19 DIAGNOSIS — R509 Fever, unspecified: Secondary | ICD-10-CM | POA: Diagnosis not present

## 2020-07-21 ENCOUNTER — Telehealth (INDEPENDENT_AMBULATORY_CARE_PROVIDER_SITE_OTHER): Payer: Medicare Other | Admitting: Physician Assistant

## 2020-07-21 ENCOUNTER — Encounter: Payer: Self-pay | Admitting: Physician Assistant

## 2020-07-21 ENCOUNTER — Other Ambulatory Visit: Payer: Self-pay

## 2020-07-21 DIAGNOSIS — U071 COVID-19: Secondary | ICD-10-CM

## 2020-07-21 NOTE — Telephone Encounter (Signed)
Patient has appointment this AM.

## 2020-07-21 NOTE — Progress Notes (Signed)
I have discussed the procedure for the virtual visit with the patient who has given consent to proceed with assessment and treatment.   Nyeshia Mysliwiec S Caia Lofaro, CMA     

## 2020-07-21 NOTE — Patient Instructions (Signed)
Instructions sent to patients MyChart.

## 2020-07-21 NOTE — Progress Notes (Signed)
Virtual Visit via Video   I connected with patient on 07/21/20 at  8:30 AM EST by a video enabled telemedicine application and verified that I am speaking with the correct person using two identifiers.  Location patient: Home Location provider: [facility], Office Persons participating in the virtual visit: Patient, Provider, [place] (Patina Moore)  I discussed the limitations of evaluation and management by telemedicine and the availability of in person appointments. The patient expressed understanding and agreed to proceed.  Subjective:   HPI:   Patient presents via [place] today c/o URI symptoms including --nasal congestion, sinus pressure, ear pressure, sore throat and fatigue. COVID test on Monday 07/19/2020 that was positive. Notes that she has had intermittent fever with T-max of 100.4.  Notes fever seems to be resolved since yesterday.  Some chills and aches initially that are improved.  Denies any chest pain or shortness of breath.  Denies recent travel.  Nephew that lives with them began with symptoms 6 days ago, testing positive for COVID.  Her symptoms started Sunday night, 07/18/2020.  Has not been vaccinated..  Is taking OTC vitamin D, vitamin C and zinc.  States the place where she was tested gave her a prednisone pack and Z-Pak.  States she is not sure she should take it as she says she did not see a provider.   ROS:   See pertinent positives and negatives per HPI.  Patient Active Problem List   Diagnosis Date Noted  . Thickened endometrium 04/07/2020  . Vaginal discharge 04/07/2020  . 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 Aortic atherosclerosis ([place]) 03/17/2019  . 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 Abdominal pain 03/08/2019  . Pure hypercholesterolemia 09/25/2017  . Morbid obesity ([place]) 06/07/2017  . Encounter for Medicare annual wellness exam 06/07/2017  . Vitamin D  deficiency 06/07/2017  . IBS (irritable bowel syndrome) 08/29/2016  . Migraine 08/29/2016  . Disorder of musculoskeletal system 03/23/2015  . Eye exam abnormal 03/23/2015  . NASH (nonalcoholic steatohepatitis) 10/19/2011    Social History   Tobacco Use  . Smoking status: Never Smoker  . Smokeless tobacco: Never Used  Substance Use Topics  . Alcohol use: No    Alcohol/week: 0.0 standard drinks    Current Outpatient Medications:  .  apixaban (ELIQUIS) 5 MG TABS tablet, Take 1 tablet (5 mg total) by mouth 2 (two) times daily., Disp: 60 tablet, Rfl: 3 .  Bisacodyl (GENTLE LAXATIVE PO), Take by mouth., Disp: , Rfl:  .  Cholecalciferol (VITAMIN D-3) 1000 UNITS CAPS, Take 1 each by mouth daily. Liquid d 3 5 drops per day, Disp: , Rfl:  .  Digestive Enzymes (ENZYME DIGEST) CAPS, Take by mouth as needed. , Disp: , Rfl:  .  ibuprofen (ADVIL) 400 MG tablet, Take 400 mg by mouth every 6 (six) hours as needed., Disp: , Rfl:  .  NON FORMULARY, ROLFING: Deep Tissue Massage, 2 Txs weekly as needed for joint pain, Disp: , Rfl:  .  Probiotic Product (PROBIOTIC-10 PO), Take 1 tablet by mouth daily.  (Patient not taking: Reported on 05/10/2020), Disp: , Rfl:  .  Trace Min CaCrCuFeKMgMnPSeZn (MINERALS PO), Take 1 oz by mouth daily., Disp: , Rfl:   Allergies  Allergen Reactions  . Celebrex [Celecoxib] Swelling and Rash  . Lactose Intolerance (Gi) Other (See Comments)    Objective:   There were no vitals taken for this visit.  Patient  is well-developed, well-nourished in no acute distress.  Resting comfortably at home.  Head is normocephalic, atraumatic.  No labored breathing.  Speech is clear and coherent with logical content.  Patient is alert and oriented at baseline.   Assessment and Plan:   1. COVID-19 Thankfully mildly symptomatic.  She is at higher risk given age, weight and lack of vaccine.  We will send her information to infusion center but discussed with patient that monoclonal  antibody infusion treatments are significantly limited now I cannot guarantee they will be able to set this up for her.  She is to continue her vitamin D, vitamin C and zinc.  Supportive measures and other OTC medications reviewed.  Strict ER precautions reviewed with patient.  Patient enrolled in [place] chart monitoring program.  She is to remain quarantine until feeling better and at least 7 days from symptom onset.  Discussed with her in the absence of severe sore throat or chest tightness/shortness of breath, would hold off on use of prednisone as we want her immune system to be at full force to fight infection I will.  Discussed with her that it does not sound like there is a secondary bacterial infection so I do not feel that a Z-Pak is indicated at present but if she is already started taking she can continue. - MyChart COVID-19 home monitoring program; Future - Temperature monitoring; Future    Leeanne Rio, PA-C 07/21/2020

## 2020-07-23 ENCOUNTER — Other Ambulatory Visit: Payer: Self-pay | Admitting: Oncology

## 2020-07-23 ENCOUNTER — Telehealth: Payer: Self-pay

## 2020-07-23 NOTE — Progress Notes (Signed)
Called to Discuss with patient about Covid symptoms and the use of the monoclonal antibody infusion for those with mild to moderate Covid symptoms and at a high risk of hospitalization.     Pt is qualified for this infusion due to co-morbid conditions and/or a member of an at-risk group.     Patient Active Problem List   Diagnosis Date Noted  . Thickened endometrium 04/07/2020  . Vaginal discharge 04/07/2020  . 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 Aortic atherosclerosis ([place]) 03/17/2019  . 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 Abdominal pain 03/08/2019  . Pure hypercholesterolemia 09/25/2017  . Morbid obesity ([place]) 06/07/2017  . Encounter for Medicare annual wellness exam 06/07/2017  . Vitamin D deficiency 06/07/2017  . IBS (irritable bowel syndrome) 08/29/2016  . Migraine 08/29/2016  . Disorder of musculoskeletal system 03/23/2015  . Eye exam abnormal 03/23/2015  . NASH (nonalcoholic steatohepatitis) 10/19/2011    Patient declines infusion at this time. Symptoms tier reviewed as well as criteria for ending isolation. Preventative practices reviewed. Patient verbalized understanding.   Onset sx-1/10  Will call us back if she begins to feel worse.   Patient advised to call back if he/she decides that he/she does want to get infusion. Callback number to the infusion center given. Patient advised to go to Urgent care or ED with severe symptoms.   Faythe Casa, NP 07/23/2020 12:26 PM

## 2020-07-23 NOTE — Telephone Encounter (Signed)
Patient is returning a call to [facility] regarding covid treatment.  Please call back at [phone]

## 2020-07-23 NOTE — Telephone Encounter (Signed)
Called to discuss with patient about COVID-19 symptoms and the use of one of the available treatments for those with mild to moderate Covid symptoms and at a high risk of hospitalization.  Pt appears to qualify for outpatient treatment due to co-morbid conditions and/or a member of an at-risk group in accordance with the FDA Emergency Use Authorization.    Symptom onset: 07/15/20 Vaccinated: No Booster? No Immunocompromised? No Qualifiers: Obesity  Unable to reach pt - Unable to leave a message. Pt. Out of treatment window.   Leslie Knapp

## 2020-07-26 NOTE — Telephone Encounter (Signed)
Patient returned call to Magda Paganini can be reached at Ph# [phone]

## 2020-07-26 NOTE — Telephone Encounter (Signed)
Called pt. Back in regard to additional treatments for COVID 19. Pt. Is out of window to benefit from treatment.

## 2020-07-26 NOTE — Telephone Encounter (Signed)
Called pt. Back and explained she was out of the treatment window for infusion therapies. Needs to be within a 7 day window to benefit.She started having symptoms 07/17/20. Verbalizes understanding. Will call her PCP as needed.

## 2020-08-02 ENCOUNTER — Encounter: Payer: Self-pay | Admitting: Physician Assistant

## 2020-08-04 ENCOUNTER — Encounter: Payer: Self-pay | Admitting: Physician Assistant

## 2020-08-09 ENCOUNTER — Other Ambulatory Visit: Payer: Self-pay

## 2020-08-09 ENCOUNTER — Encounter: Payer: Self-pay | Admitting: Obstetrics & Gynecology

## 2020-08-09 ENCOUNTER — Ambulatory Visit (INDEPENDENT_AMBULATORY_CARE_PROVIDER_SITE_OTHER): Payer: Medicare Other | Admitting: Obstetrics & Gynecology

## 2020-08-09 VITALS — BP 114/76 | HR 76 | Wt 306.0 lb

## 2020-08-09 DIAGNOSIS — N950 Postmenopausal bleeding: Secondary | ICD-10-CM | POA: Diagnosis not present

## 2020-08-09 DIAGNOSIS — N898 Other specified noninflammatory disorders of vagina: Secondary | ICD-10-CM

## 2020-08-09 NOTE — Progress Notes (Signed)
History:  70 y.o. G0P0000 here today for eval of h/o PMPB (or a 'pink tinged discharge). Pt is s/p a D&C 9/28. The procedure was complicated with a suspected uterine perforation and was aborted. The pathology showed a polyp. Pt reports that since that time she has had some discharge that is light but, is not pink in color. She denies bleeding. She denies other GYN sx.   The following portions of the patient's history were reviewed and updated as appropriate: allergies, current medications, past family history, past medical history, past social history, past surgical history and problem list.  Review of Systems:  Pertinent items are noted in HPI.    Objective:  Physical Exam BP 114/76   Pulse 76   Wt (!) 306 lb (138.8 kg)   BMI 43.91 kg/m  CONSTITUTIONAL: Well-developed, well-nourished female in no acute distress.  HENT:  Normocephalic, atraumatic EYES: Conjunctivae and EOM are normal. No scleral icterus.  NECK: Normal range of motion SKIN: Skin is warm and dry. No rash noted. Not diaphoretic.No pallor. [place]: Alert and oriented to person, place, and time. Normal coordination.  Pelvic: deferred  Labs and Imaging 07/01/2020 CLINICAL DATA:  Follow-up D&C  EXAM: ULTRASOUND PELVIS TRANSVAGINAL  TECHNIQUE: Transvaginal ultrasound examination of the pelvis was performed including evaluation of the uterus, ovaries, adnexal regions, and pelvic cul-de-sac.  COMPARISON:  02/03/2020  FINDINGS: Uterus  Measurements: 4.4 x 3.2 x 4.6 cm = volume: 34 mL. Three uterine fibroids measuring up to 14 mm.  Endometrium  Thickness: 4 mm.  No focal abnormality visualized.  Right ovary  Not discretely visualized.  No adnexal mass is seen.  Left ovary  Not discretely visualized.  No adnexal mass is seen.  Other findings:  No abnormal free fluid  IMPRESSION: Endometrial complex measures 4 mm, within normal limits.  Bilateral ovaries are not discretely visualized. No  adnexal mass is seen.   Assessment & Plan:  PMPB resolved. I reviewed the [place] with pt and answered her questions regarding. The fibroids on the [place] and reviewed the endometrial stripe.   Vaginal discharge. Pt educated that vaginal discharge alone is not an issue. Reviewed that if it becomes excessive, has an odor or is assoc with blood, that she should be seen for reeval.   F/u 03/2021 for annual GYN  Total face-to-face time with patient was 20 min.  Greater than 50% was spent in counseling and coordination of care with the patient.   Vicktoria Muckey L. Harraway-Smith, M.D., Cherlynn June

## 2020-08-09 NOTE — Progress Notes (Signed)
On Megace and had follow up ultrasound on 07/01/2020. Kathrene Alu RN

## 2020-08-16 ENCOUNTER — Other Ambulatory Visit: Payer: Self-pay | Admitting: *Deleted

## 2020-08-16 DIAGNOSIS — I749 Embolism and thrombosis of unspecified artery: Secondary | ICD-10-CM

## 2020-08-16 DIAGNOSIS — I2602 Saddle embolus of pulmonary artery with acute cor pulmonale: Secondary | ICD-10-CM

## 2020-08-16 MED ORDER — APIXABAN 5 MG PO TABS: 5.0000 mg | ORAL_TABLET | Freq: Two times a day (BID) | ORAL | 3 refills | Status: DC

## 2020-08-17 DIAGNOSIS — B078 Other viral warts: Secondary | ICD-10-CM | POA: Diagnosis not present

## 2020-08-17 DIAGNOSIS — L218 Other seborrheic dermatitis: Secondary | ICD-10-CM | POA: Diagnosis not present

## 2020-08-17 DIAGNOSIS — L820 Inflamed seborrheic keratosis: Secondary | ICD-10-CM | POA: Diagnosis not present

## 2020-08-17 DIAGNOSIS — L578 Other skin changes due to chronic exposure to nonionizing radiation: Secondary | ICD-10-CM | POA: Diagnosis not present

## 2020-08-17 DIAGNOSIS — L821 Other seborrheic keratosis: Secondary | ICD-10-CM | POA: Diagnosis not present

## 2020-08-23 ENCOUNTER — Inpatient Hospital Stay: Payer: Medicare Other | Attending: Hematology & Oncology

## 2020-08-23 ENCOUNTER — Encounter: Payer: Self-pay | Admitting: Hematology & Oncology

## 2020-08-23 ENCOUNTER — Inpatient Hospital Stay (HOSPITAL_BASED_OUTPATIENT_CLINIC_OR_DEPARTMENT_OTHER): Payer: Medicare Other | Admitting: Hematology & Oncology

## 2020-08-23 ENCOUNTER — Other Ambulatory Visit: Payer: Self-pay

## 2020-08-23 VITALS — BP 128/64 | HR 82 | Temp 98.1°F | Resp 17 | Wt 310.0 lb

## 2020-08-23 DIAGNOSIS — Z886 Allergy status to analgesic agent status: Secondary | ICD-10-CM | POA: Diagnosis not present

## 2020-08-23 DIAGNOSIS — Z7901 Long term (current) use of anticoagulants: Secondary | ICD-10-CM | POA: Insufficient documentation

## 2020-08-23 DIAGNOSIS — I749 Embolism and thrombosis of unspecified artery: Secondary | ICD-10-CM

## 2020-08-23 DIAGNOSIS — D6859 Other primary thrombophilia: Secondary | ICD-10-CM | POA: Diagnosis not present

## 2020-08-23 DIAGNOSIS — Z79899 Other long term (current) drug therapy: Secondary | ICD-10-CM | POA: Diagnosis not present

## 2020-08-23 DIAGNOSIS — Q2112 Patent foramen ovale: Secondary | ICD-10-CM

## 2020-08-23 DIAGNOSIS — I742 Embolism and thrombosis of arteries of the upper extremities: Secondary | ICD-10-CM | POA: Diagnosis not present

## 2020-08-23 DIAGNOSIS — M2550 Pain in unspecified joint: Secondary | ICD-10-CM | POA: Diagnosis not present

## 2020-08-23 DIAGNOSIS — Q211 Atrial septal defect: Secondary | ICD-10-CM | POA: Diagnosis not present

## 2020-08-23 DIAGNOSIS — I2602 Saddle embolus of pulmonary artery with acute cor pulmonale: Secondary | ICD-10-CM

## 2020-08-23 DIAGNOSIS — I2692 Saddle embolus of pulmonary artery without acute cor pulmonale: Secondary | ICD-10-CM | POA: Diagnosis not present

## 2020-08-23 LAB — CBC WITH DIFFERENTIAL (CANCER CENTER ONLY)
Abs Immature Granulocytes: 0.03 10*3/uL (ref 0.00–0.07)
Basophils Absolute: 0 10*3/uL (ref 0.0–0.1)
Basophils Relative: 0 %
Eosinophils Absolute: 0.2 10*3/uL (ref 0.0–0.5)
Eosinophils Relative: 2 %
HCT: 47.4 % — ABNORMAL HIGH (ref 36.0–46.0)
Hemoglobin: 15.7 g/dL — ABNORMAL HIGH (ref 12.0–15.0)
Immature Granulocytes: 0 %
Lymphocytes Relative: 31 %
Lymphs Abs: 2.8 10*3/uL (ref 0.7–4.0)
MCH: 30 pg (ref 26.0–34.0)
MCHC: 33.1 g/dL (ref 30.0–36.0)
MCV: 90.5 fL (ref 80.0–100.0)
Monocytes Absolute: 0.6 10*3/uL (ref 0.1–1.0)
Monocytes Relative: 6 %
Neutro Abs: 5.4 10*3/uL (ref 1.7–7.7)
Neutrophils Relative %: 61 %
Platelet Count: 217 10*3/uL (ref 150–400)
RBC: 5.24 MIL/uL — ABNORMAL HIGH (ref 3.87–5.11)
RDW: 13.2 % (ref 11.5–15.5)
WBC Count: 9.1 10*3/uL (ref 4.0–10.5)
nRBC: 0 % (ref 0.0–0.2)

## 2020-08-23 LAB — CMP (CANCER CENTER ONLY)
ALT: 17 U/L (ref 0–44)
AST: 23 U/L (ref 15–41)
Albumin: 4.3 g/dL (ref 3.5–5.0)
Alkaline Phosphatase: 94 U/L (ref 38–126)
Anion gap: 8 (ref 5–15)
BUN: 17 mg/dL (ref 8–23)
CO2: 31 mmol/L (ref 22–32)
Calcium: 10.1 mg/dL (ref 8.9–10.3)
Chloride: 101 mmol/L (ref 98–111)
Creatinine: 0.91 mg/dL (ref 0.44–1.00)
GFR, Estimated: >60 mL/min (ref >60)
Glucose, Bld: 91 mg/dL (ref 70–99)
Potassium: 4.1 mmol/L (ref 3.5–5.1)
Sodium: 140 mmol/L (ref 135–145)
Total Bilirubin: 0.4 mg/dL (ref 0.3–1.2)
Total Protein: 6.9 g/dL (ref 6.5–8.1)

## 2020-08-23 LAB — D-DIMER, QUANTITATIVE: D-Dimer, Quant: 0.49 ug/mL-FEU (ref 0.00–0.50)

## 2020-08-23 NOTE — Progress Notes (Signed)
Hematology and Oncology Follow Up Visit  Leslie Knapp [id number] 01-May-1951 70 y.o. 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  She is doing pretty well.  We last saw her 6 months ago.  She has been quite busy.  Apparently, she bought a house out in [place] with her nephew.  She is not sure when she will move out there.  She says it probably will be several years.  Her sister who is a twin, will need hip surgery.  This probably will happen in the next few months.  She does not have Protein S or Protein C deficiency.  We rechecked her levels.  Everything came back normal.  She is still on Eliquis.  She is doing well on the Eliquis.  She has had no issues with cough.  She has had no nausea or vomiting.  She is really bothered by her hips and knees.  It sounds like she probably is going to need hip surgery herself.  She has had no bleeding.  She has had no problems with headache.  Overall, her performance status is ECOG 1.   Medications:  Allergies as of 08/23/2020      Reactions   Celebrex [celecoxib] Swelling, Rash   Lactose Intolerance (gi) Other (See Comments)      Medication List       Accurate as of August 23, 2020  4:01 PM. If you have any questions, ask your nurse or doctor.        apixaban 5 MG Tabs tablet Commonly known as: Eliquis Take 1 tablet (5 mg total) by mouth 2 (two) times daily.   Enzyme Digest Caps Take by mouth as needed.   GENTLE LAXATIVE PO Take by mouth.   ibuprofen 400 MG tablet Commonly known as: ADVIL Take 400 mg by mouth every 6 (six) hours as needed.   MINERALS PO Take 1 oz by mouth daily.   Natural C/Rose Hips 1000 MG tablet Generic drug: ascorbic acid Take 2,000 mg by mouth  daily.   NON FORMULARY ROLFING: Deep Tissue Massage, 2 Txs weekly as needed for joint pain   Vitamin D-3 25 MCG (1000 UT) Caps Take 1 each by mouth daily. Liquid d 3 5 drops per day       Allergies:  Allergies  Allergen Reactions  . Celebrex [Celecoxib] Swelling and Rash  . Lactose Intolerance (Gi) Other (See Comments)    Past Medical History, Surgical history, Social history, and Family History were reviewed and updated.  Review of Systems: Review of Systems  Constitutional: Negative.   HENT: Negative.   Eyes: Negative.   Respiratory: Negative.   Cardiovascular: Negative.   Gastrointestinal: Negative.   Genitourinary: Negative.   Musculoskeletal: Positive for joint pain.  Skin: Negative.   Neurological: Negative.   Endo/Heme/Allergies: Negative.   Psychiatric/Behavioral: Negative.      Physical Exam:  weight is 310 lb (140.6 kg) (abnormal). Her oral temperature is 98.1 F (36.7 C). Her blood pressure is 128/64 and her pulse is 82. Her respiration is 17 and oxygen saturation is 98%.   Wt Readings from Last 3 Encounters:  08/23/20 (!) 310 lb (140.6 kg)  08/09/20 (!) 306 lb (138.8 kg)  05/10/20 (!) 305 lb (138.3 kg)    Physical Exam Vitals  reviewed.  HENT:     Head: Normocephalic and atraumatic.     Mouth/Throat:     Mouth: Oropharynx is clear and moist.  Eyes:     Extraocular Movements: EOM normal.     Pupils: Pupils are equal, round, and reactive to light.  Cardiovascular:     Rate and Rhythm: Normal rate and regular rhythm.     Heart sounds: Normal heart sounds.  Pulmonary:     Effort: Pulmonary effort is normal.     Breath sounds: Normal breath sounds.  Abdominal:     General: Bowel sounds are normal.     Palpations: Abdomen is soft.  Musculoskeletal:        General: No tenderness, deformity or edema. Normal range of motion.     Cervical back: Normal range of motion.  Lymphadenopathy:     Cervical: No cervical adenopathy.  Skin:    General:  Skin is warm and dry.     Findings: No erythema or rash.  Neurological:     Mental Status: She is alert and oriented to person, place, and time.  Psychiatric:        Mood and Affect: Mood and affect normal.        Behavior: Behavior normal.        Thought Content: Thought content normal.        Judgment: Judgment normal.      Lab Results  Component Value Date   WBC 9.1 08/23/2020   HGB 15.7 (H) 08/23/2020   HCT 47.4 (H) 08/23/2020   MCV 90.5 08/23/2020   PLT 217 08/23/2020   Lab Results  Component Value Date   FERRITIN 136.2 03/28/2016   Lab Results  Component Value Date   RBC 5.24 (H) 08/23/2020   No results found for: KPAFRELGTCHN, LAMBDASER, KAPLAMBRATIO No results found for: IGGSERUM, IGA, IGMSERUM No results found for: Odetta Pink, SPEI   Chemistry      Component Value Date/Time   NA 140 08/23/2020 1432   NA 140 09/24/2019 1934   K 4.1 08/23/2020 1432   CL 101 08/23/2020 1432   CO2 31 08/23/2020 1432   BUN 17 08/23/2020 1432   BUN 15 09/24/2019 1934   CREATININE 0.91 08/23/2020 1432   GLU 87 05/21/2018 0000      Component Value Date/Time   CALCIUM 10.1 08/23/2020 1432   ALKPHOS 94 08/23/2020 1432   AST 23 08/23/2020 1432   ALT 17 08/23/2020 1432   BILITOT 0.4 08/23/2020 1432       Impression and Plan: Leslie Knapp is a very pleasant 71yo caucasian femalewith historyof bilateral saddle pulmonary emboli with right heart strain as well as a right brachial artery thrombus.  Her initiallab work up did show both a protein C and a protein S deficiency at the time.  However, we have repeated these and everything is normal now.  She will stay on Eliquis.  I really think that she is going to need lifelong Eliquis.  Of note, we did do a JAK2 assay on her.  This was negative.  If she does need to have hip surgery or knee surgery, I do not think this would be a problem.  I told her that she needs let us  know if this will happen.  We will plan to get her back in another 6 months.   Volanda Napoleon, MD 2/14/20224:01 PM

## 2020-08-24 ENCOUNTER — Telehealth: Payer: Self-pay | Admitting: *Deleted

## 2020-08-24 LAB — IRON AND TIBC
Iron: 63 ug/dL (ref 41–142)
Saturation Ratios: 16 % — ABNORMAL LOW (ref 21–57)
TIBC: 383 ug/dL (ref 236–444)
UIBC: 319 ug/dL (ref 120–384)

## 2020-08-24 LAB — PROTEIN S, TOTAL: Protein S Ag, Total: 107 % (ref 60–150)

## 2020-08-24 LAB — PROTEIN S ACTIVITY: Protein S Activity: 110 % (ref 63–140)

## 2020-08-24 LAB — FERRITIN: Ferritin: 129 ng/mL (ref 11–307)

## 2020-08-24 LAB — PROTEIN C ACTIVITY: Protein C Activity: 128 % (ref 73–180)

## 2020-08-24 NOTE — Telephone Encounter (Signed)
Per los 08/23/20 called patient lvm of upcoming appts.

## 2020-08-25 LAB — PROTEIN C, TOTAL: Protein C, Total: 95 % (ref 60–150)

## 2020-09-13 ENCOUNTER — Ambulatory Visit (INDEPENDENT_AMBULATORY_CARE_PROVIDER_SITE_OTHER): Payer: Medicare Other | Admitting: Ophthalmology

## 2020-09-13 ENCOUNTER — Encounter (INDEPENDENT_AMBULATORY_CARE_PROVIDER_SITE_OTHER): Payer: Self-pay | Admitting: Ophthalmology

## 2020-09-13 ENCOUNTER — Other Ambulatory Visit: Payer: Self-pay

## 2020-09-13 DIAGNOSIS — I749 Embolism and thrombosis of unspecified artery: Secondary | ICD-10-CM

## 2020-09-13 DIAGNOSIS — H2513 Age-related nuclear cataract, bilateral: Secondary | ICD-10-CM

## 2020-09-13 DIAGNOSIS — H43812 Vitreous degeneration, left eye: Secondary | ICD-10-CM | POA: Insufficient documentation

## 2020-09-13 NOTE — Progress Notes (Signed)
09/13/2020     CHIEF COMPLAINT Patient presents for Flashes/floaters (Pt c/o FOL in OS. Pt states she noticed an increase in the flashes on Saturday. Pt states on Sunday she noticed a large black spot in OS vision. PT states she has small floaters but has never seen this before. Pt feels "pressure or swelling" in outside corner of OS.)   HISTORY OF PRESENT ILLNESS: Leslie Knapp is a 70 y.o. female who presents to the clinic today for:   HPI    Flashes/floaters    In left eye.  This started 2 days ago.  Duration of 2 days.  Duration Constant.  Characterized as large and spots.  Since onset it is stable.  Associated Symptoms Flashes and Floaters.  Treatments tried include no treatments.  Response to treatment was no improvement.  I, the attending physician,  performed the HPI with the patient and updated documentation appropriately. Additional comments: Pt c/o FOL in OS. Pt states she noticed an increase in the flashes on Saturday. Pt states on Sunday she noticed a large black spot in OS vision. PT states she has small floaters but has never seen this before. Pt feels "pressure or swelling" in outside corner of OS.       Last edited by Tilda Franco on 09/13/2020  2:17 PM. (History)      Referring physician: Brunetta Jeans, PA-C [id number] [place],  [place] [address]  HISTORICAL INFORMATION:   Selected notes from the MEDICAL RECORD NUMBER    Lab Results  Component Value Date   HGBA1C 5.7 05/21/2018     CURRENT MEDICATIONS: No current outpatient medications on file. (Ophthalmic Drugs)   No current facility-administered medications for this visit. (Ophthalmic Drugs)   Current Outpatient Medications (Other)  Medication Sig  . apixaban (ELIQUIS) 5 MG TABS tablet Take 1 tablet (5 mg total) by mouth 2 (two) times daily.  . Bisacodyl (GENTLE LAXATIVE PO) Take by mouth.  . Cholecalciferol (VITAMIN D-3) 1000 UNITS CAPS Take 1 each by mouth daily. Liquid d 3 5 drops per  day  . Digestive Enzymes (ENZYME DIGEST) CAPS Take by mouth as needed.   Marland Kitchen ibuprofen (ADVIL) 400 MG tablet Take 400 mg by mouth every 6 (six) hours as needed.  Marland Kitchen NATURAL C/ROSE HIPS 1000 MG tablet Take 2,000 mg by mouth daily.  . NON FORMULARY ROLFING: Deep Tissue Massage, 2 Txs weekly as needed for joint pain  . Trace Min [id number] (MINERALS PO) Take 1 oz by mouth daily.   No current facility-administered medications for this visit. (Other)      REVIEW OF SYSTEMS:    ALLERGIES Allergies  Allergen Reactions  . Celebrex [Celecoxib] Swelling and Rash  . Lactose Intolerance (Gi) Other (See Comments)    PAST MEDICAL HISTORY Past Medical History:  Diagnosis Date  . Acute saddle pulmonary embolism ([place]) 06/2019  . Complication of anesthesia    wants neck supported during surgery due to hx of migraine caused by concussion  . Concussion 2010, 2000 and 1998   periodiotic migraine and occ dizzines  . COVID-19 09/2019   sx covid pneumonia,fever, chills body aches, took monoclonal antibody tx done symptoms resolved after 6 weeks  . CTS (carpal tunnel syndrome)   . Fatty liver disease, nonalcoholic    In [facility] Study liver biopsy 2010 did not show fatty liver  . History of blood transfusion 1975   after knee surgery  . IBS (irritable bowel syndrome)  C/D  . Localized swelling of both lower legs    go down with propping up of legs  . Morbid obesity ([place])   . Osteoarthritis    oa  . Pneumonia 09/2019  . PONV (postoperative nausea and vomiting)    watch neck position needs support or gets n/v  . Thyroid nodule    sees ent dr Radene Journey for q year  . Uterine polyp    Past Surgical History:  Procedure Laterality Date  . CARDIAC CATHETERIZATION  03/21/2019   with pfo closure  . HYSTEROSCOPY WITH D & C N/A 04/06/2020   Procedure: DILATATION AND CURETTAGE /HYSTEROSCOPY;  Surgeon: Lavonia Drafts, MD;  Location: [facility];  Service: Gynecology;   Laterality: N/A;  . IVC FILTER INSERTION  07/07/2019  . KNEE SURGERY  1975   Left Patella Tendon Replacement  . LIVER BIOPSY  2010  . PATENT FORAMEN OVALE(PFO) CLOSURE  03/21/2019  . TONSILLECTOMY  1963  . 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 TOTAL KNEE ARTHROPLASTY  11.2005   Right  . UTERINE POLYP REMOVAL  2010    FAMILY HISTORY Family History  Problem Relation Age of Onset  . Stroke Mother 14       Deceased  . Hypertension Mother   . GI problems Mother   . Arthritis Mother   . Dementia Mother   . Heart defect Father 44       Deceased  . Parkinson's disease Father   . Varicose Veins Father   . Stroke Maternal Grandfather   . Arthritis Maternal Grandfather   . Kidney failure Maternal Grandmother   . Gallbladder disease Maternal Grandmother   . Diverticulitis Sister        #1  . Arthritis Sister   . Healthy Sister        #2    SOCIAL HISTORY Social History   Tobacco Use  . Smoking status: Never Smoker  . Smokeless tobacco: Never Used  Vaping Use  . Vaping Use: Never used  Substance Use Topics  . Alcohol use: No    Alcohol/week: 0.0 standard drinks  . Drug use: No         OPHTHALMIC EXAM:  Base Eye Exam    Visual Acuity (Snellen - Linear)      Right Left   Dist cc 20/50 20/50 -1   Dist ph cc 20/20 -2 20/25 +2   Correction: Glasses       Tonometry (Tonopen, 2:24 PM)      Right Left   Pressure 14 13       Pupils      Pupils Dark Light Shape React APD   Right PERRL 4 3 Round  Brisk None   Left PERRL 4 3 Round Brisk None       Visual Fields (Counting fingers)      Left Right    Full Full       Extraocular Movement      Right Left    Full Full       Neuro/Psych    Oriented x3: Yes   Mood/Affect: Normal       Dilation    Both eyes: 1.0% Mydriacyl, 2.5% Phenylephrine @ 2:24 PM        Slit Lamp and Fundus Exam    External Exam      Right Left   External Normal Normal       Slit Lamp Exam  Right Left   Lids/Lashes  Normal Normal   Conjunctiva/Sclera White and quiet White and quiet   Cornea Clear Clear   Anterior Chamber Deep and quiet Deep and quiet   Iris Round and reactive Round and reactive   Lens 1+ Nuclear sclerosis 1+ Nuclear sclerosis   Anterior Vitreous Normal Normal, , no Shafer's sign       Fundus Exam      Right Left   Posterior Vitreous Normal Posterior vitreous detachment, Weiss ring   Disc Normal Normal   C/D Ratio 0.05 0.05   Macula Normal Normal   Vessels Normal Normal   Periphery Normal, 25 diopter, 28 D,  20 D examination to the ora serrata OU Normal, 25 diopter, 28 D,  20 D examination to the ora serrata OU          IMAGING AND PROCEDURES  Imaging and Procedures for 09/13/20  OCT, Retina - OU - Both Eyes       Right Eye Quality was good. Scan locations included subfoveal. Central Foveal Thickness: 270. Progression has no prior data. Findings include vitreomacular adhesion .   Left Eye Quality was good. Scan locations included subfoveal. Central Foveal Thickness: 280. Progression has no prior data.   Notes Incidental posterior vitreous detachment left eye, no active maculopathy OU.       Color Fundus Photography Optos - OU - Both Eyes       Right Eye Progression has no prior data. Macula : normal observations. Vessels : normal observations. Periphery : normal observations.   Left Eye Progression has no prior data. Disc findings include normal observations. Macula : normal observations. Vessels : normal observations. Periphery : normal observations.   Notes Media clear OU.  Weiss ring visible left eye.  Coincident sides with patient symptom left eye.  No retinal holes or tears                ASSESSMENT/PLAN:  Posterior vitreous detachment of left eye   The nature of posterior vitreous detachment was discussed with the patient as well as its physiology, its age prevalence, and its possible implication regarding retinal breaks and detachment.  An  informational brochure was given to the patient.  All the patient's questions were answered.  The patient was asked to return if new or different flashes or floaters develops.   Patient was instructed to contact office immediately if any changes were noticed. I explained to the patient that vitreous inside the eye is similar to jello inside a bowl. As the jello melts it can start to pull away from the bowl, similarly the vitreous throughout our lives can begin to pull away from the retina. That process is called a posterior vitreous detachment. In some cases, the vitreous can tug hard enough on the retina to form a retinal tear. I discussed with the patient the signs and symptoms of a retinal detachment.  Do not rub the eye.  Left eye with no retinal holes or tears on careful dilated examination 360.    Nuclear sclerotic cataract of both eyes The nature of cataract was discussed with the patient as well as the elective nature of surgery. The patient was reassured that surgery at a later date does not put the patient at risk for a worse outcome. It was emphasized that the need for surgery is dictated by the patient's quality of life as influenced by the cataract. Patient was instructed to maintain close follow up with their general eye care doctor.  Moderate lens opacity and color with no impact on acuity.  Follow-up with Dr. Reine Just regarding cataracts as scheduled      ICD-10-CM   1. Posterior vitreous detachment of left eye  H43.812 OCT, Retina - OU - Both Eyes    Color Fundus Photography Optos - OU - Both Eyes  2. Nuclear sclerotic cataract of both eyes  H25.13     1.  2.  3.  Ophthalmic Meds Ordered this visit:  No orders of the defined types were placed in this encounter.      Return in about 6 weeks (around 10/25/2020) for dilate, OS.  Patient Instructions  Vitreous Detachment  Vitreous detachment is part of the normal aging process in the eyes. Vitreous is the jelly-like  substance that makes up most of the inside of the eyeballs. It helps the eyeballs keep a round shape. The vitreous is attached to the retina of the eye with a series of fibers. As you age, the vitreous gradually shrinks. Tension increases between the fibers and the retina. Eventually, the fibers can break free from the retina, causing vitreous detachment. In most cases, this does not cause problems and does not require treatment. However, it can sometimes cause the retina to separate from the eyeball (retinal detachment), which requires treatment to prevent vision loss. What are the causes? Aging is the main cause of vitreous detachment. Everyone's vitreous naturally shrinks with age. What increases the risk? You are more likely to have vitreous detachment if you:  Are at least 69 years old.  Have inflammation of the eye.  Have an eye injury.  Have had eye surgery.  Have a hemorrhage in your eye.  Are very nearsighted (myopia).  Have diabetes. What are the signs or symptoms? Most people with this condition will not notice any symptoms. If symptoms do occur, the most common are floaters. Floaters occur as the vitreous begins to shrink. They may:  Appear as tiny dots or webs in your vision.  Seem to disappear when you look at them directly.  Appear more often as your condition gets worse. Other symptoms include:  Flashes of light (photopsia) in your peripheral vision that may look like lightning streaks.  Decreased vision or a dark curtain or shadow moving across your field of vision. This is rare. How is this diagnosed? This condition may be diagnosed based on:  Your signs and symptoms.  An exam by a health care provider who specializes in conditions and diseases of the eye (ophthalmologist). The exam may include: ? Putting eye drops in your eye to make the pupil wider (dilated). The pupil is the opening in the center of the eye. ? Checking the pupils with a magnifying glass.  This exam is the best way to determine the type and extent of damage to your eye. How is this treated? For most people, a vitreous detachment is harmless, causing no symptoms or vision loss, and does not require treatment. Floaters usually become less noticeable over time. If the condition causes retinal detachment, you may need eye surgery to reattach your retina (reattachment surgery) in order to prevent vision loss or restore your vision. Follow these instructions at home:  Keep all follow-up visits as told by your health care provider. This is important. Get help right away if:  You develop signs of retinal detachment. These include: ? A sudden increase in the number of floaters you see. ? An increase in the number of flashes of light you see in your  peripheral vision. ? Decreased vision. Summary  Vitreous detachment is part of the normal aging process in the eyes.  Vitreous is the jelly-like substance inside the eyeballs. As you age, the vitreous shrinks, and the fibers that attach the vitreous to the retina can break free, causing vitreous detachment.  In most cases, vitreous detachment does not cause symptoms and does not require treatment. The most common symptom that can occur is seeing floaters that appear as tiny dots or webs in your vision.  Vitreous detachment can sometimes cause the retina to separate from the eyeball (retinal detachment). This must be treated to prevent vision loss. This information is not intended to replace advice given to you by your health care provider. Make sure you discuss any questions you have with your health care provider. Document Revised: 02/26/2020 Document Reviewed: 02/26/2020 Elsevier Patient Education  2021 [employer] the diagnoses, plan, and follow up with the patient and they expressed understanding.  Patient expressed understanding of the importance of proper follow up care.   Clent Demark Kyren Knick M.D. Diseases & Surgery  of the Retina and Vitreous [facility] [place] 09/13/20     Abbreviations: M myopia (nearsighted); A astigmatism; H hyperopia (farsighted); P presbyopia; Mrx spectacle prescription;  CTL contact lenses; OD right eye; OS left eye; OU both eyes  XT exotropia; ET esotropia; PEK punctate epithelial keratitis; PEE punctate epithelial erosions; DES dry eye syndrome; MGD meibomian gland dysfunction; ATs artificial tears; PFAT's preservative free artificial tears; [place] nuclear sclerotic cataract; PSC posterior subcapsular cataract; ERM epi-retinal membrane; PVD posterior vitreous detachment; RD retinal detachment; DM diabetes mellitus; DR diabetic retinopathy; NPDR non-proliferative diabetic retinopathy; PDR proliferative diabetic retinopathy; CSME clinically significant macular edema; DME diabetic macular edema; dbh dot blot hemorrhages; CWS cotton wool spot; POAG primary open angle glaucoma; C/D cup-to-disc ratio; HVF humphrey visual field; GVF goldmann visual field; OCT optical coherence tomography; IOP intraocular pressure; BRVO Branch retinal vein occlusion; CRVO central retinal vein occlusion; CRAO central retinal artery occlusion; BRAO branch retinal artery occlusion; RT retinal tear; SB scleral buckle; PPV pars plana vitrectomy; VH Vitreous hemorrhage; PRP panretinal laser photocoagulation; IVK intravitreal kenalog; VMT vitreomacular traction; MH Macular hole;  NVD neovascularization of the disc; NVE neovascularization elsewhere; AREDS age related eye disease study; ARMD age related macular degeneration; POAG primary open angle glaucoma; EBMD epithelial/anterior basement membrane dystrophy; ACIOL anterior chamber intraocular lens; IOL intraocular lens; PCIOL posterior chamber intraocular lens; Phaco/IOL phacoemulsification with intraocular lens placement; [place] photorefractive keratectomy; LASIK laser assisted in situ keratomileusis; HTN hypertension; DM diabetes mellitus; COPD chronic obstructive  pulmonary disease

## 2020-09-13 NOTE — Assessment & Plan Note (Signed)
The nature of cataract was discussed with the patient as well as the elective nature of surgery. The patient was reassured that surgery at a later date does not put the patient at risk for a worse outcome. It was emphasized that the need for surgery is dictated by the patient's quality of life as influenced by the cataract. Patient was instructed to maintain close follow up with their general eye care doctor.  Moderate lens opacity and color with no impact on acuity.  Follow-up with Dr. Reine Just regarding cataracts as scheduled

## 2020-09-13 NOTE — Patient Instructions (Signed)
Vitreous Detachment  Vitreous detachment is part of the normal aging process in the eyes. Vitreous is the jelly-like substance that makes up most of the inside of the eyeballs. It helps the eyeballs keep a round shape. The vitreous is attached to the retina of the eye with a series of fibers. As you age, the vitreous gradually shrinks. Tension increases between the fibers and the retina. Eventually, the fibers can break free from the retina, causing vitreous detachment. In most cases, this does not cause problems and does not require treatment. However, it can sometimes cause the retina to separate from the eyeball (retinal detachment), which requires treatment to prevent vision loss. What are the causes? Aging is the main cause of vitreous detachment. Everyone's vitreous naturally shrinks with age. What increases the risk? You are more likely to have vitreous detachment if you:  Are at least 69 years old.  Have inflammation of the eye.  Have an eye injury.  Have had eye surgery.  Have a hemorrhage in your eye.  Are very nearsighted (myopia).  Have diabetes. What are the signs or symptoms? Most people with this condition will not notice any symptoms. If symptoms do occur, the most common are floaters. Floaters occur as the vitreous begins to shrink. They may:  Appear as tiny dots or webs in your vision.  Seem to disappear when you look at them directly.  Appear more often as your condition gets worse. Other symptoms include:  Flashes of light (photopsia) in your peripheral vision that may look like lightning streaks.  Decreased vision or a dark curtain or shadow moving across your field of vision. This is rare. How is this diagnosed? This condition may be diagnosed based on:  Your signs and symptoms.  An exam by a health care provider who specializes in conditions and diseases of the eye (ophthalmologist). The exam may include: ? Putting eye drops in your eye to make the  pupil wider (dilated). The pupil is the opening in the center of the eye. ? Checking the pupils with a magnifying glass. This exam is the best way to determine the type and extent of damage to your eye. How is this treated? For most people, a vitreous detachment is harmless, causing no symptoms or vision loss, and does not require treatment. Floaters usually become less noticeable over time. If the condition causes retinal detachment, you may need eye surgery to reattach your retina (reattachment surgery) in order to prevent vision loss or restore your vision. Follow these instructions at home:  Keep all follow-up visits as told by your health care provider. This is important. Get help right away if:  You develop signs of retinal detachment. These include: ? A sudden increase in the number of floaters you see. ? An increase in the number of flashes of light you see in your peripheral vision. ? Decreased vision. Summary  Vitreous detachment is part of the normal aging process in the eyes.  Vitreous is the jelly-like substance inside the eyeballs. As you age, the vitreous shrinks, and the fibers that attach the vitreous to the retina can break free, causing vitreous detachment.  In most cases, vitreous detachment does not cause symptoms and does not require treatment. The most common symptom that can occur is seeing floaters that appear as tiny dots or webs in your vision.  Vitreous detachment can sometimes cause the retina to separate from the eyeball (retinal detachment). This must be treated to prevent vision loss. This information is not  intended to replace advice given to you by your health care provider. Make sure you discuss any questions you have with your health care provider. Document Revised: 02/26/2020 Document Reviewed: 02/26/2020 Elsevier Patient Education  2021 [employer].

## 2020-09-13 NOTE — Assessment & Plan Note (Addendum)
The nature of posterior vitreous detachment was discussed with the patient as well as its physiology, its age prevalence, and its possible implication regarding retinal breaks and detachment.  An informational brochure was given to the patient.  All the patient's questions were answered.  The patient was asked to return if new or different flashes or floaters develops.   Patient was instructed to contact office immediately if any changes were noticed. I explained to the patient that vitreous inside the eye is similar to jello inside a bowl. As the jello melts it can start to pull away from the bowl, similarly the vitreous throughout our lives can begin to pull away from the retina. That process is called a posterior vitreous detachment. In some cases, the vitreous can tug hard enough on the retina to form a retinal tear. I discussed with the patient the signs and symptoms of a retinal detachment.  Do not rub the eye.  Left eye with no retinal holes or tears on careful dilated examination 360.

## 2020-09-23 DIAGNOSIS — H524 Presbyopia: Secondary | ICD-10-CM | POA: Diagnosis not present

## 2020-09-23 DIAGNOSIS — G43809 Other migraine, not intractable, without status migrainosus: Secondary | ICD-10-CM | POA: Diagnosis not present

## 2020-09-23 DIAGNOSIS — H5319 Other subjective visual disturbances: Secondary | ICD-10-CM | POA: Diagnosis not present

## 2020-09-23 DIAGNOSIS — H2513 Age-related nuclear cataract, bilateral: Secondary | ICD-10-CM | POA: Diagnosis not present

## 2020-09-23 DIAGNOSIS — H04123 Dry eye syndrome of bilateral lacrimal glands: Secondary | ICD-10-CM | POA: Diagnosis not present

## 2020-10-06 DIAGNOSIS — M9901 Segmental and somatic dysfunction of cervical region: Secondary | ICD-10-CM | POA: Diagnosis not present

## 2020-10-06 DIAGNOSIS — M546 Pain in thoracic spine: Secondary | ICD-10-CM | POA: Diagnosis not present

## 2020-10-06 DIAGNOSIS — M62838 Other muscle spasm: Secondary | ICD-10-CM | POA: Diagnosis not present

## 2020-10-06 DIAGNOSIS — M542 Cervicalgia: Secondary | ICD-10-CM | POA: Diagnosis not present

## 2020-10-06 DIAGNOSIS — M9903 Segmental and somatic dysfunction of lumbar region: Secondary | ICD-10-CM | POA: Diagnosis not present

## 2020-10-06 DIAGNOSIS — M5417 Radiculopathy, lumbosacral region: Secondary | ICD-10-CM | POA: Diagnosis not present

## 2020-10-06 DIAGNOSIS — M9902 Segmental and somatic dysfunction of thoracic region: Secondary | ICD-10-CM | POA: Diagnosis not present

## 2020-10-13 DIAGNOSIS — M9903 Segmental and somatic dysfunction of lumbar region: Secondary | ICD-10-CM | POA: Diagnosis not present

## 2020-10-13 DIAGNOSIS — M5417 Radiculopathy, lumbosacral region: Secondary | ICD-10-CM | POA: Diagnosis not present

## 2020-10-13 DIAGNOSIS — M9901 Segmental and somatic dysfunction of cervical region: Secondary | ICD-10-CM | POA: Diagnosis not present

## 2020-10-13 DIAGNOSIS — M546 Pain in thoracic spine: Secondary | ICD-10-CM | POA: Diagnosis not present

## 2020-10-13 DIAGNOSIS — M9902 Segmental and somatic dysfunction of thoracic region: Secondary | ICD-10-CM | POA: Diagnosis not present

## 2020-10-13 DIAGNOSIS — M62838 Other muscle spasm: Secondary | ICD-10-CM | POA: Diagnosis not present

## 2020-10-13 DIAGNOSIS — M542 Cervicalgia: Secondary | ICD-10-CM | POA: Diagnosis not present

## 2020-10-20 DIAGNOSIS — M9902 Segmental and somatic dysfunction of thoracic region: Secondary | ICD-10-CM | POA: Diagnosis not present

## 2020-10-20 DIAGNOSIS — M542 Cervicalgia: Secondary | ICD-10-CM | POA: Diagnosis not present

## 2020-10-20 DIAGNOSIS — M9901 Segmental and somatic dysfunction of cervical region: Secondary | ICD-10-CM | POA: Diagnosis not present

## 2020-10-20 DIAGNOSIS — M62838 Other muscle spasm: Secondary | ICD-10-CM | POA: Diagnosis not present

## 2020-10-20 DIAGNOSIS — M546 Pain in thoracic spine: Secondary | ICD-10-CM | POA: Diagnosis not present

## 2020-10-20 DIAGNOSIS — M5417 Radiculopathy, lumbosacral region: Secondary | ICD-10-CM | POA: Diagnosis not present

## 2020-10-20 DIAGNOSIS — M9903 Segmental and somatic dysfunction of lumbar region: Secondary | ICD-10-CM | POA: Diagnosis not present

## 2020-10-27 ENCOUNTER — Ambulatory Visit (INDEPENDENT_AMBULATORY_CARE_PROVIDER_SITE_OTHER): Payer: Medicare Other | Admitting: Ophthalmology

## 2020-10-27 ENCOUNTER — Other Ambulatory Visit: Payer: Self-pay

## 2020-10-27 ENCOUNTER — Encounter (INDEPENDENT_AMBULATORY_CARE_PROVIDER_SITE_OTHER): Payer: Self-pay | Admitting: Ophthalmology

## 2020-10-27 DIAGNOSIS — H2513 Age-related nuclear cataract, bilateral: Secondary | ICD-10-CM

## 2020-10-27 DIAGNOSIS — M9902 Segmental and somatic dysfunction of thoracic region: Secondary | ICD-10-CM | POA: Diagnosis not present

## 2020-10-27 DIAGNOSIS — M9903 Segmental and somatic dysfunction of lumbar region: Secondary | ICD-10-CM | POA: Diagnosis not present

## 2020-10-27 DIAGNOSIS — H43812 Vitreous degeneration, left eye: Secondary | ICD-10-CM | POA: Diagnosis not present

## 2020-10-27 DIAGNOSIS — M546 Pain in thoracic spine: Secondary | ICD-10-CM | POA: Diagnosis not present

## 2020-10-27 DIAGNOSIS — M9901 Segmental and somatic dysfunction of cervical region: Secondary | ICD-10-CM | POA: Diagnosis not present

## 2020-10-27 DIAGNOSIS — M5417 Radiculopathy, lumbosacral region: Secondary | ICD-10-CM | POA: Diagnosis not present

## 2020-10-27 DIAGNOSIS — I749 Embolism and thrombosis of unspecified artery: Secondary | ICD-10-CM

## 2020-10-27 DIAGNOSIS — M62838 Other muscle spasm: Secondary | ICD-10-CM | POA: Diagnosis not present

## 2020-10-27 DIAGNOSIS — M542 Cervicalgia: Secondary | ICD-10-CM | POA: Diagnosis not present

## 2020-10-27 NOTE — Assessment & Plan Note (Signed)
Follow-up with Dr. Dennison Mascot or [facility] as scheduled

## 2020-10-27 NOTE — Progress Notes (Signed)
10/27/2020     CHIEF COMPLAINT Patient presents for Retina Follow Up (6wk fu os//Pt states, " I have had several days where I have seen lights flashing temporally OS but I have not noticed any increase in floaters. " )   HISTORY OF PRESENT ILLNESS: Leslie Knapp is a 70 y.o. female who presents to the clinic today for:   HPI    Retina Follow Up    Patient presents with  PVD.  In left eye.  This started 6 weeks ago.  Duration of 6 weeks. Additional comments: 6wk fu os  Pt states, " I have had several days where I have seen lights flashing temporally OS but I have not noticed any increase in floaters. "        Last edited by Kendra Opitz, COA on 10/27/2020  1:45 PM. (History)      Referring physician: Midge Minium, MD [id number] [place],  [place] [address]  HISTORICAL INFORMATION:   Selected notes from the MEDICAL RECORD NUMBER    Lab Results  Component Value Date   HGBA1C 5.7 05/21/2018     CURRENT MEDICATIONS: No current outpatient medications on file. (Ophthalmic Drugs)   No current facility-administered medications for this visit. (Ophthalmic Drugs)   Current Outpatient Medications (Other)  Medication Sig  . apixaban (ELIQUIS) 5 MG TABS tablet Take 1 tablet (5 mg total) by mouth 2 (two) times daily.  . Bisacodyl (GENTLE LAXATIVE PO) Take by mouth.  . Cholecalciferol (VITAMIN D-3) 1000 UNITS CAPS Take 1 each by mouth daily. Liquid d 3 5 drops per day  . Digestive Enzymes (ENZYME DIGEST) CAPS Take by mouth as needed.   Marland Kitchen ibuprofen (ADVIL) 400 MG tablet Take 400 mg by mouth every 6 (six) hours as needed.  Marland Kitchen NATURAL C/ROSE HIPS 1000 MG tablet Take 2,000 mg by mouth daily.  . NON FORMULARY ROLFING: Deep Tissue Massage, 2 Txs weekly as needed for joint pain  . Trace Min CaCrCuFeKMgMnPSeZn (MINERALS PO) Take 1 oz by mouth daily.   No current facility-administered medications for this visit. (Other)      REVIEW OF SYSTEMS:    ALLERGIES Allergies   Allergen Reactions  . Celebrex [Celecoxib] Swelling and Rash  . Lactose Intolerance (Gi) Other (See Comments)    PAST MEDICAL HISTORY Past Medical History:  Diagnosis Date  . Acute saddle pulmonary embolism ([facility]) 06/2019  . Complication of anesthesia    wants neck supported during surgery due to hx of migraine caused by concussion  . Concussion 2010, 2000 and 1998   periodiotic migraine and occ dizzines  . COVID-19 09/2019   sx covid pneumonia,fever, chills body aches, took monoclonal antibody tx done symptoms resolved after 6 weeks  . CTS (carpal tunnel syndrome)   . Fatty liver disease, nonalcoholic    In [facility] Study liver biopsy 2010 did not show fatty liver  . History of blood transfusion 1975   after knee surgery  . IBS (irritable bowel syndrome)    C/D  . Localized swelling of both lower legs    go down with propping up of legs  . Morbid obesity ([place] [facility])   . Osteoarthritis    oa  . Pneumonia 09/2019  . PONV (postoperative nausea and vomiting)    watch neck position needs support or gets n/v  . Thyroid nodule    sees ent dr Radene Journey for q year  . Uterine polyp    Past Surgical History:  Procedure Laterality Date  . CARDIAC CATHETERIZATION  03/21/2019   with pfo closure  . HYSTEROSCOPY WITH D & C N/A 04/06/2020   Procedure: DILATATION AND CURETTAGE /HYSTEROSCOPY;  Surgeon: Lavonia Drafts, MD;  Location: [facility];  Service: Gynecology;  Laterality: N/A;  . IVC FILTER INSERTION  07/07/2019  . KNEE SURGERY  1975   Left Patella Tendon Replacement  . LIVER BIOPSY  2010  . PATENT FORAMEN OVALE(PFO) CLOSURE  03/21/2019  . TONSILLECTOMY  1963  . 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 TOTAL KNEE ARTHROPLASTY  11.2005   Right  . UTERINE POLYP REMOVAL  2010    FAMILY HISTORY Family History  Problem Relation Age of Onset  . Stroke Mother 46       Deceased  . Hypertension Mother   . GI problems Mother   . Arthritis Mother   .  Dementia Mother   . Heart defect Father 12       Deceased  . Parkinson's disease Father   . Varicose Veins Father   . Stroke Maternal Grandfather   . Arthritis Maternal Grandfather   . Kidney failure Maternal Grandmother   . Gallbladder disease Maternal Grandmother   . Diverticulitis Sister        #1  . Arthritis Sister   . Healthy Sister        #2    SOCIAL HISTORY Social History   Tobacco Use  . Smoking status: Never Smoker  . Smokeless tobacco: Never Used  Vaping Use  . Vaping Use: Never used  Substance Use Topics  . Alcohol use: No    Alcohol/week: 0.0 standard drinks  . Drug use: No         OPHTHALMIC EXAM: Base Eye Exam    Visual Acuity (ETDRS)      Right Left   Dist  20/25 -2 20/20   Correction: Glasses       Tonometry (Tonopen, 1:49 PM)      Right Left   Pressure 10 12       Pupils      Pupils Dark Light Shape React APD   Right PERRL 4 3 Round Brisk None   Left PERRL 4 3 Round Brisk None       Visual Fields (Counting fingers)      Left Right    Full Full       Extraocular Movement      Right Left    Full Full       Neuro/Psych    Oriented x3: Yes   Mood/Affect: Normal       Dilation    Left eye: 1.0% Mydriacyl, 2.5% Phenylephrine @ 1:49 PM        Slit Lamp and Fundus Exam    External Exam      Right Left   External Normal Normal       Slit Lamp Exam      Right Left   Lids/Lashes Normal Normal   Conjunctiva/Sclera White and quiet White and quiet   Cornea Clear Clear   Anterior Chamber Deep and quiet Deep and quiet   Iris Round and reactive Round and reactive   Lens 1+ Nuclear sclerosis 1+ Nuclear sclerosis   Anterior Vitreous Normal Normal, , no Shafer's sign       Fundus Exam      Right Left   Posterior Vitreous  Posterior vitreous detachment, Weiss ring   Disc  Normal   C/D  Ratio  0.05   Macula  Normal   Vessels  Normal   Periphery  Normal, 25 diopter, 28 D,  20 D examination to the ora serrata OU           IMAGING AND PROCEDURES  Imaging and Procedures for 10/27/20  OCT, Retina - OU - Both Eyes       Right Eye Quality was good. Scan locations included subfoveal. Central Foveal Thickness: 267. Progression has been stable. Findings include vitreomacular adhesion .   Left Eye Quality was good. Scan locations included subfoveal. Central Foveal Thickness: 278. Progression has been stable.   Notes Incidental posterior vitreous detachment left eye, no active maculopathy OS  Partial PVD OD with partial vitreomacular adhesion noted OD                ASSESSMENT/PLAN:  Nuclear sclerotic cataract of both eyes Follow-up with Dr. Dennison Mascot or [facility] as scheduled  Posterior vitreous detachment of left eye No holes or tears      ICD-10-CM   1. Posterior vitreous detachment of left eye  H43.812 OCT, Retina - OU - Both Eyes  2. Nuclear sclerotic cataract of both eyes  H25.13     1.  PVD and improving symptoms OS yet occasional flashes of light which are separated by days in their occurrence.  Signs of hole or tear left eye  2.  Nuclear sclerotic cataract follow-up with Dr. Dennison Mascot as scheduled  3.  Follow-up here as needed or as per Dr. Eulas Post  Ophthalmic Meds Ordered this visit:  No orders of the defined types were placed in this encounter.      Return if symptoms worsen or fail to improve, for Follow-up with Dr. Dennison Mascot and [facility] as scheduled.  There are no Patient Instructions on file for this visit.   Explained the diagnoses, plan, and follow up with the patient and they expressed understanding.  Patient expressed understanding of the importance of proper follow up care.   Clent Demark Nielle Duford M.D. Diseases & Surgery of the Retina and Vitreous [facility]ina [facility] Di[facility] [place] 10/27/20     Abbreviations: M myopia (nearsighted); A astigmatism; H hyperopia (farsighted); P presbyopia; Mrx spectacle prescription;  CTL contact  lenses; OD right eye; OS left eye; OU both eyes  XT exotropia; ET esotropia; PEK punctate epithelial keratitis; PEE punctate epithelial erosions; DES dry eye syndrome; MGD meibomian gland dysfunction; ATs artificial tears; PFAT's preservative free artificial tears; [place] nuclear sclerotic cataract; PSC posterior subcapsular cataract; ERM epi-retinal membrane; PVD posterior vitreous detachment; RD retinal detachment; DM diabetes mellitus; DR diabetic retinopathy; NPDR non-proliferative diabetic retinopathy; PDR proliferative diabetic retinopathy; CSME clinically significant macular edema; DME diabetic macular edema; dbh dot blot hemorrhages; CWS cotton wool spot; POAG primary open angle glaucoma; C/D cup-to-disc ratio; HVF humphrey visual field; GVF goldmann visual field; OCT optical coherence tomography; IOP intraocular pressure; BRVO Branch retinal vein occlusion; CRVO central retinal vein occlusion; CRAO central retinal artery occlusion; BRAO branch retinal artery occlusion; RT retinal tear; SB scleral buckle; PPV pars plana vitrectomy; VH Vitreous hemorrhage; PRP panretinal laser photocoagulation; IVK intravitreal kenalog; VMT vitreomacular traction; MH Macular hole;  NVD neovascularization of the disc; NVE neovascularization elsewhere; AREDS age related eye disease study; ARMD age related macular degeneration; POAG primary open angle glaucoma; EBMD epithelial/anterior basement membrane dystrophy; ACIOL anterior chamber intraocular lens; IOL intraocular lens; PCIOL posterior chamber intraocular lens; Phaco/IOL phacoemulsification with intraocular lens placement; PRK photorefractive keratectomy; LASIK laser assisted  in situ keratomileusis; HTN hypertension; DM diabetes mellitus; COPD chronic obstructive pulmonary disease

## 2020-10-27 NOTE — Assessment & Plan Note (Signed)
No holes or tears 

## 2020-11-03 DIAGNOSIS — M9903 Segmental and somatic dysfunction of lumbar region: Secondary | ICD-10-CM | POA: Diagnosis not present

## 2020-11-03 DIAGNOSIS — M5417 Radiculopathy, lumbosacral region: Secondary | ICD-10-CM | POA: Diagnosis not present

## 2020-11-03 DIAGNOSIS — M9901 Segmental and somatic dysfunction of cervical region: Secondary | ICD-10-CM | POA: Diagnosis not present

## 2020-11-03 DIAGNOSIS — M9902 Segmental and somatic dysfunction of thoracic region: Secondary | ICD-10-CM | POA: Diagnosis not present

## 2020-11-03 DIAGNOSIS — M62838 Other muscle spasm: Secondary | ICD-10-CM | POA: Diagnosis not present

## 2020-11-03 DIAGNOSIS — M542 Cervicalgia: Secondary | ICD-10-CM | POA: Diagnosis not present

## 2020-11-03 DIAGNOSIS — M546 Pain in thoracic spine: Secondary | ICD-10-CM | POA: Diagnosis not present

## 2020-11-08 ENCOUNTER — Other Ambulatory Visit (HOSPITAL_BASED_OUTPATIENT_CLINIC_OR_DEPARTMENT_OTHER): Payer: Self-pay | Admitting: Family Medicine

## 2020-11-08 DIAGNOSIS — Z1231 Encounter for screening mammogram for malignant neoplasm of breast: Secondary | ICD-10-CM

## 2020-11-11 DIAGNOSIS — Z471 Aftercare following joint replacement surgery: Secondary | ICD-10-CM | POA: Diagnosis not present

## 2020-11-11 DIAGNOSIS — Z96653 Presence of artificial knee joint, bilateral: Secondary | ICD-10-CM | POA: Diagnosis not present

## 2020-11-11 DIAGNOSIS — M25561 Pain in right knee: Secondary | ICD-10-CM | POA: Diagnosis not present

## 2020-11-11 DIAGNOSIS — M5416 Radiculopathy, lumbar region: Secondary | ICD-10-CM | POA: Diagnosis not present

## 2020-11-11 DIAGNOSIS — M25562 Pain in left knee: Secondary | ICD-10-CM | POA: Diagnosis not present

## 2020-11-12 DIAGNOSIS — M542 Cervicalgia: Secondary | ICD-10-CM | POA: Diagnosis not present

## 2020-11-12 DIAGNOSIS — M5417 Radiculopathy, lumbosacral region: Secondary | ICD-10-CM | POA: Diagnosis not present

## 2020-11-12 DIAGNOSIS — M546 Pain in thoracic spine: Secondary | ICD-10-CM | POA: Diagnosis not present

## 2020-11-12 DIAGNOSIS — M9902 Segmental and somatic dysfunction of thoracic region: Secondary | ICD-10-CM | POA: Diagnosis not present

## 2020-11-12 DIAGNOSIS — M62838 Other muscle spasm: Secondary | ICD-10-CM | POA: Diagnosis not present

## 2020-11-12 DIAGNOSIS — M9903 Segmental and somatic dysfunction of lumbar region: Secondary | ICD-10-CM | POA: Diagnosis not present

## 2020-11-12 DIAGNOSIS — M9901 Segmental and somatic dysfunction of cervical region: Secondary | ICD-10-CM | POA: Diagnosis not present

## 2020-11-15 DIAGNOSIS — M62838 Other muscle spasm: Secondary | ICD-10-CM | POA: Diagnosis not present

## 2020-11-15 DIAGNOSIS — M9902 Segmental and somatic dysfunction of thoracic region: Secondary | ICD-10-CM | POA: Diagnosis not present

## 2020-11-15 DIAGNOSIS — M9903 Segmental and somatic dysfunction of lumbar region: Secondary | ICD-10-CM | POA: Diagnosis not present

## 2020-11-15 DIAGNOSIS — M542 Cervicalgia: Secondary | ICD-10-CM | POA: Diagnosis not present

## 2020-11-15 DIAGNOSIS — M546 Pain in thoracic spine: Secondary | ICD-10-CM | POA: Diagnosis not present

## 2020-11-15 DIAGNOSIS — M5417 Radiculopathy, lumbosacral region: Secondary | ICD-10-CM | POA: Diagnosis not present

## 2020-11-15 DIAGNOSIS — M9901 Segmental and somatic dysfunction of cervical region: Secondary | ICD-10-CM | POA: Diagnosis not present

## 2020-11-16 ENCOUNTER — Encounter (HOSPITAL_BASED_OUTPATIENT_CLINIC_OR_DEPARTMENT_OTHER): Payer: Self-pay

## 2020-11-16 ENCOUNTER — Other Ambulatory Visit: Payer: Self-pay

## 2020-11-16 ENCOUNTER — Ambulatory Visit (HOSPITAL_BASED_OUTPATIENT_CLINIC_OR_DEPARTMENT_OTHER)
Admission: RE | Admit: 2020-11-16 | Discharge: 2020-11-16 | Disposition: A | Discharge status: Home / Self Care | Payer: Medicare Other | Source: Ambulatory Visit | Attending: Family Medicine | Admitting: Family Medicine

## 2020-11-16 DIAGNOSIS — Z1231 Encounter for screening mammogram for malignant neoplasm of breast: Secondary | ICD-10-CM | POA: Insufficient documentation

## 2020-11-18 DIAGNOSIS — M9901 Segmental and somatic dysfunction of cervical region: Secondary | ICD-10-CM | POA: Diagnosis not present

## 2020-11-18 DIAGNOSIS — M9902 Segmental and somatic dysfunction of thoracic region: Secondary | ICD-10-CM | POA: Diagnosis not present

## 2020-11-18 DIAGNOSIS — M9903 Segmental and somatic dysfunction of lumbar region: Secondary | ICD-10-CM | POA: Diagnosis not present

## 2020-11-18 DIAGNOSIS — M542 Cervicalgia: Secondary | ICD-10-CM | POA: Diagnosis not present

## 2020-11-18 DIAGNOSIS — M5417 Radiculopathy, lumbosacral region: Secondary | ICD-10-CM | POA: Diagnosis not present

## 2020-11-18 DIAGNOSIS — M62838 Other muscle spasm: Secondary | ICD-10-CM | POA: Diagnosis not present

## 2020-11-18 DIAGNOSIS — M546 Pain in thoracic spine: Secondary | ICD-10-CM | POA: Diagnosis not present

## 2020-11-22 ENCOUNTER — Encounter (INDEPENDENT_AMBULATORY_CARE_PROVIDER_SITE_OTHER): Payer: Self-pay | Admitting: Otolaryngology

## 2020-11-22 ENCOUNTER — Ambulatory Visit (INDEPENDENT_AMBULATORY_CARE_PROVIDER_SITE_OTHER): Payer: Medicare Other | Admitting: Otolaryngology

## 2020-11-22 ENCOUNTER — Other Ambulatory Visit: Payer: Self-pay

## 2020-11-22 VITALS — Temp 97.3°F

## 2020-11-22 DIAGNOSIS — M5417 Radiculopathy, lumbosacral region: Secondary | ICD-10-CM | POA: Diagnosis not present

## 2020-11-22 DIAGNOSIS — M9903 Segmental and somatic dysfunction of lumbar region: Secondary | ICD-10-CM | POA: Diagnosis not present

## 2020-11-22 DIAGNOSIS — M542 Cervicalgia: Secondary | ICD-10-CM | POA: Diagnosis not present

## 2020-11-22 DIAGNOSIS — E041 Nontoxic single thyroid nodule: Secondary | ICD-10-CM

## 2020-11-22 DIAGNOSIS — I749 Embolism and thrombosis of unspecified artery: Secondary | ICD-10-CM | POA: Diagnosis not present

## 2020-11-22 DIAGNOSIS — M546 Pain in thoracic spine: Secondary | ICD-10-CM | POA: Diagnosis not present

## 2020-11-22 DIAGNOSIS — M9901 Segmental and somatic dysfunction of cervical region: Secondary | ICD-10-CM | POA: Diagnosis not present

## 2020-11-22 DIAGNOSIS — M9902 Segmental and somatic dysfunction of thoracic region: Secondary | ICD-10-CM | POA: Diagnosis not present

## 2020-11-22 DIAGNOSIS — M62838 Other muscle spasm: Secondary | ICD-10-CM | POA: Diagnosis not present

## 2020-11-22 NOTE — Progress Notes (Signed)
HPI: Leslie Knapp is a 70 y.o. female who returns today for evaluation of right thyroid nodule..  This was noted initially on the ultrasound a little over a year ago that showed a 2 cm right mid thyroid nodule.  This met criteria for fine-needle aspirate and this was performed.  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 She states that she sometimes gets hoarse when she talks a lot. She also occasionally gets choked on pills. However her weight has been stable over the past year and she has not lost any weight.  Past Medical History:  Diagnosis Date  . Acute saddle pulmonary embolism ([facility]) 06/2019  . Complication of anesthesia    wants neck supported during surgery due to hx of migraine caused by concussion  . Concussion 2010, 2000 and 1998   periodiotic migraine and occ dizzines  . COVID-19 09/2019   sx covid pneumonia,fever, chills body aches, took monoclonal antibody tx done symptoms resolved after 6 weeks  . CTS (carpal tunnel syndrome)   . Fatty liver disease, nonalcoholic    In [facility] Study liver biopsy 2010 did not show fatty liver  . History of blood transfusion 1975   after knee surgery  . IBS (irritable bowel syndrome)    C/D  . Localized swelling of both lower legs    go down with propping up of legs  . Morbid obesity ([facility])   . Osteoarthritis    oa  . Pneumonia 09/2019  . PONV (postoperative nausea and vomiting)    watch neck position needs support or gets n/v  . Thyroid nodule    sees ent dr Radene Journey for q year  . Uterine polyp    Past Surgical History:  Procedure Laterality Date  . CARDIAC CATHETERIZATION  03/21/2019   with pfo closure  . HYSTEROSCOPY WITH D & C N/A 04/06/2020   Procedure: DILATATION AND CURETTAGE /HYSTEROSCOPY;  Surgeon: Lavonia Drafts, MD;  Location: [facility];  Service: Gynecology;  Laterality: N/A;   . IVC FILTER INSERTION  07/07/2019  . KNEE SURGERY  1975   Left Patella Tendon Replacement  . LIVER BIOPSY  2010  . PATENT FORAMEN OVALE(PFO) CLOSURE  03/21/2019  . TONSILLECTOMY  1963  . 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 TOTAL KNEE ARTHROPLASTY  11.2005   Right  . UTERINE POLYP REMOVAL  2010   Social History   Socioeconomic History  . Marital status: Widowed    Spouse name: Not on file  . Number of children: Not on file  . Years of education: Not on file  . Highest education level: Not on file  Occupational History  . Occupation: retired  Tobacco Use  . Smoking status: Never Smoker  . Smokeless tobacco: Never Used  Vaping Use  . Vaping Use: Never used  Substance and Sexual Activity  . Alcohol use: No    Alcohol/week: 0.0 standard drinks  . Drug use: No  . Sexual activity: Never  Other Topics Concern  . Not on file  Social History Narrative  . Not on file   Social Determinants of Health   Financial Resource Strain: Low Risk   . Difficulty of Paying Living Expenses: Not hard at all  Food Insecurity: No Food Insecurity  . Worried About Charity fundraiser in the Last Year: Never true  . Ran Out of Food in the  Last Year: Never true  Transportation Needs: No Transportation Needs  . Lack of Transportation (Medical): No  . Lack of Transportation (Non-Medical): No  Physical Activity: Insufficiently Active  . Days of Exercise per Week: 4 days  . Minutes of Exercise per Session: 30 min  Stress: Not on file  Social Connections: Socially Isolated  . Frequency of Communication with Friends and Family: More than three times a week  . Frequency of Social Gatherings with Friends and Family: More than three times a week  . Attends Religious Services: Never  . Active Member of Clubs or Organizations: No  . Attends Archivist Meetings: Never  . Marital Status: Widowed   Family History  Problem Relation Age of Onset  . Stroke Mother 80       Deceased   . Hypertension Mother   . GI problems Mother   . Arthritis Mother   . Dementia Mother   . Heart defect Father 57       Deceased  . Parkinson's disease Father   . Varicose Veins Father   . Stroke Maternal Grandfather   . Arthritis Maternal Grandfather   . Kidney failure Maternal Grandmother   . Gallbladder disease Maternal Grandmother   . Diverticulitis Sister        #1  . Arthritis Sister   . Healthy Sister        #2   Allergies  Allergen Reactions  . Celebrex [Celecoxib] Swelling and Rash  . Lactose Intolerance (Gi) Other (See Comments)   Prior to Admission medications   Medication Sig Start Date End Date Taking? Authorizing Provider  apixaban (ELIQUIS) 5 MG TABS tablet Take 1 tablet (5 mg total) by mouth 2 (two) times daily. 08/16/20   Volanda Napoleon, MD  Bisacodyl (GENTLE LAXATIVE PO) Take by mouth.    [provider]  Cholecalciferol (VITAMIN D-3) 1000 UNITS CAPS Take 1 each by mouth daily. Liquid d 3 5 drops per day    [provider]  Digestive Enzymes (ENZYME DIGEST) CAPS Take by mouth as needed.     [provider]  ibuprofen (ADVIL) 400 MG tablet Take 400 mg by mouth every 6 (six) hours as needed.    [provider]  NATURAL C/ROSE HIPS 1000 MG tablet Take 2,000 mg by mouth daily. 07/20/20   [provider]  NON FORMULARY ROLFING: Deep Tissue Massage, 2 Txs weekly as needed for joint pain    [provider]  Trace Min CaCrCuFeKMgMnPSeZn (MINERALS PO) Take 1 oz by mouth daily.    [provider]     Positive ROS: Otherwise negative  All other systems have been reviewed and were otherwise negative with the exception of those mentioned in the HPI and as above.  Physical Exam: Constitutional: Alert, well-appearing, no acute distress.  She is having no hoarseness today. Ears: External ears without lesions or tenderness. Ear canals are clear bilaterally with intact, clear TMs.  Nasal: External nose without  lesions.. Clear nasal passages Oral: Lips and gums without lesions. Tongue and palate mucosa without lesions. Posterior oropharynx clear. Neck: No palpable adenopathy or masses.  I am unable to palpate the right thyroid nodule as this only measures 2 cm on ultrasound study.  She has no palpable supraclavicular adenopathy or any adenopathy along the jugular chain of lymph nodes on either side of the neck. Respiratory: Breathing comfortably  Skin: No facial/neck lesions or rash noted.  Procedures  Assessment: Patient with a right thyroid nodule  that on FNA has low risk of malignancy.  Plan: I recommended repeating ultrasound to see if this increased in size at all. Also suggested follow-up with endocrinology concerning their recommendations. She will call us back following the repeat ultrasound.   Radene Journey, MD

## 2020-11-23 ENCOUNTER — Other Ambulatory Visit (INDEPENDENT_AMBULATORY_CARE_PROVIDER_SITE_OTHER): Payer: Self-pay

## 2020-11-23 DIAGNOSIS — E041 Nontoxic single thyroid nodule: Secondary | ICD-10-CM

## 2020-11-25 DIAGNOSIS — M9901 Segmental and somatic dysfunction of cervical region: Secondary | ICD-10-CM | POA: Diagnosis not present

## 2020-11-25 DIAGNOSIS — M542 Cervicalgia: Secondary | ICD-10-CM | POA: Diagnosis not present

## 2020-11-25 DIAGNOSIS — M5417 Radiculopathy, lumbosacral region: Secondary | ICD-10-CM | POA: Diagnosis not present

## 2020-11-25 DIAGNOSIS — M9903 Segmental and somatic dysfunction of lumbar region: Secondary | ICD-10-CM | POA: Diagnosis not present

## 2020-11-25 DIAGNOSIS — M546 Pain in thoracic spine: Secondary | ICD-10-CM | POA: Diagnosis not present

## 2020-11-25 DIAGNOSIS — M62838 Other muscle spasm: Secondary | ICD-10-CM | POA: Diagnosis not present

## 2020-11-25 DIAGNOSIS — M9902 Segmental and somatic dysfunction of thoracic region: Secondary | ICD-10-CM | POA: Diagnosis not present

## 2020-12-01 DIAGNOSIS — M5416 Radiculopathy, lumbar region: Secondary | ICD-10-CM | POA: Diagnosis not present

## 2020-12-01 DIAGNOSIS — M25561 Pain in right knee: Secondary | ICD-10-CM | POA: Diagnosis not present

## 2020-12-01 DIAGNOSIS — M25562 Pain in left knee: Secondary | ICD-10-CM | POA: Diagnosis not present

## 2020-12-02 DIAGNOSIS — M546 Pain in thoracic spine: Secondary | ICD-10-CM | POA: Diagnosis not present

## 2020-12-02 DIAGNOSIS — M62838 Other muscle spasm: Secondary | ICD-10-CM | POA: Diagnosis not present

## 2020-12-02 DIAGNOSIS — M5417 Radiculopathy, lumbosacral region: Secondary | ICD-10-CM | POA: Diagnosis not present

## 2020-12-02 DIAGNOSIS — M542 Cervicalgia: Secondary | ICD-10-CM | POA: Diagnosis not present

## 2020-12-02 DIAGNOSIS — M9901 Segmental and somatic dysfunction of cervical region: Secondary | ICD-10-CM | POA: Diagnosis not present

## 2020-12-02 DIAGNOSIS — M9902 Segmental and somatic dysfunction of thoracic region: Secondary | ICD-10-CM | POA: Diagnosis not present

## 2020-12-02 DIAGNOSIS — M9903 Segmental and somatic dysfunction of lumbar region: Secondary | ICD-10-CM | POA: Diagnosis not present

## 2020-12-03 ENCOUNTER — Other Ambulatory Visit: Payer: Self-pay

## 2020-12-03 ENCOUNTER — Ambulatory Visit (HOSPITAL_BASED_OUTPATIENT_CLINIC_OR_DEPARTMENT_OTHER)
Admission: RE | Admit: 2020-12-03 | Discharge: 2020-12-03 | Disposition: A | Discharge status: Home / Self Care | Payer: Medicare Other | Source: Ambulatory Visit | Attending: Otolaryngology | Admitting: Otolaryngology

## 2020-12-03 DIAGNOSIS — E041 Nontoxic single thyroid nodule: Secondary | ICD-10-CM | POA: Diagnosis not present

## 2020-12-08 DIAGNOSIS — M25562 Pain in left knee: Secondary | ICD-10-CM | POA: Diagnosis not present

## 2020-12-08 DIAGNOSIS — M5416 Radiculopathy, lumbar region: Secondary | ICD-10-CM | POA: Diagnosis not present

## 2020-12-08 DIAGNOSIS — M25561 Pain in right knee: Secondary | ICD-10-CM | POA: Diagnosis not present

## 2020-12-09 DIAGNOSIS — M542 Cervicalgia: Secondary | ICD-10-CM | POA: Diagnosis not present

## 2020-12-09 DIAGNOSIS — M546 Pain in thoracic spine: Secondary | ICD-10-CM | POA: Diagnosis not present

## 2020-12-09 DIAGNOSIS — M62838 Other muscle spasm: Secondary | ICD-10-CM | POA: Diagnosis not present

## 2020-12-09 DIAGNOSIS — M9902 Segmental and somatic dysfunction of thoracic region: Secondary | ICD-10-CM | POA: Diagnosis not present

## 2020-12-09 DIAGNOSIS — M9903 Segmental and somatic dysfunction of lumbar region: Secondary | ICD-10-CM | POA: Diagnosis not present

## 2020-12-09 DIAGNOSIS — M9901 Segmental and somatic dysfunction of cervical region: Secondary | ICD-10-CM | POA: Diagnosis not present

## 2020-12-09 DIAGNOSIS — M5417 Radiculopathy, lumbosacral region: Secondary | ICD-10-CM | POA: Diagnosis not present

## 2020-12-16 DIAGNOSIS — M9901 Segmental and somatic dysfunction of cervical region: Secondary | ICD-10-CM | POA: Diagnosis not present

## 2020-12-16 DIAGNOSIS — M5417 Radiculopathy, lumbosacral region: Secondary | ICD-10-CM | POA: Diagnosis not present

## 2020-12-16 DIAGNOSIS — M62838 Other muscle spasm: Secondary | ICD-10-CM | POA: Diagnosis not present

## 2020-12-16 DIAGNOSIS — M546 Pain in thoracic spine: Secondary | ICD-10-CM | POA: Diagnosis not present

## 2020-12-16 DIAGNOSIS — M9902 Segmental and somatic dysfunction of thoracic region: Secondary | ICD-10-CM | POA: Diagnosis not present

## 2020-12-16 DIAGNOSIS — M542 Cervicalgia: Secondary | ICD-10-CM | POA: Diagnosis not present

## 2020-12-16 DIAGNOSIS — M9903 Segmental and somatic dysfunction of lumbar region: Secondary | ICD-10-CM | POA: Diagnosis not present

## 2020-12-21 ENCOUNTER — Other Ambulatory Visit: Payer: Self-pay | Admitting: Hematology & Oncology

## 2020-12-21 DIAGNOSIS — I749 Embolism and thrombosis of unspecified artery: Secondary | ICD-10-CM

## 2020-12-21 DIAGNOSIS — I2602 Saddle embolus of pulmonary artery with acute cor pulmonale: Secondary | ICD-10-CM

## 2020-12-30 DIAGNOSIS — M542 Cervicalgia: Secondary | ICD-10-CM | POA: Diagnosis not present

## 2020-12-30 DIAGNOSIS — M9902 Segmental and somatic dysfunction of thoracic region: Secondary | ICD-10-CM | POA: Diagnosis not present

## 2020-12-30 DIAGNOSIS — M5417 Radiculopathy, lumbosacral region: Secondary | ICD-10-CM | POA: Diagnosis not present

## 2020-12-30 DIAGNOSIS — M546 Pain in thoracic spine: Secondary | ICD-10-CM | POA: Diagnosis not present

## 2020-12-30 DIAGNOSIS — M62838 Other muscle spasm: Secondary | ICD-10-CM | POA: Diagnosis not present

## 2020-12-30 DIAGNOSIS — M9901 Segmental and somatic dysfunction of cervical region: Secondary | ICD-10-CM | POA: Diagnosis not present

## 2020-12-30 DIAGNOSIS — M9903 Segmental and somatic dysfunction of lumbar region: Secondary | ICD-10-CM | POA: Diagnosis not present

## 2021-01-05 ENCOUNTER — Encounter: Payer: Self-pay | Admitting: *Deleted

## 2021-01-05 DIAGNOSIS — M9901 Segmental and somatic dysfunction of cervical region: Secondary | ICD-10-CM | POA: Diagnosis not present

## 2021-01-05 DIAGNOSIS — M546 Pain in thoracic spine: Secondary | ICD-10-CM | POA: Diagnosis not present

## 2021-01-05 DIAGNOSIS — M9903 Segmental and somatic dysfunction of lumbar region: Secondary | ICD-10-CM | POA: Diagnosis not present

## 2021-01-05 DIAGNOSIS — M5417 Radiculopathy, lumbosacral region: Secondary | ICD-10-CM | POA: Diagnosis not present

## 2021-01-05 DIAGNOSIS — M542 Cervicalgia: Secondary | ICD-10-CM | POA: Diagnosis not present

## 2021-01-05 DIAGNOSIS — M62838 Other muscle spasm: Secondary | ICD-10-CM | POA: Diagnosis not present

## 2021-01-05 DIAGNOSIS — M9902 Segmental and somatic dysfunction of thoracic region: Secondary | ICD-10-CM | POA: Diagnosis not present

## 2021-01-08 NOTE — Addendum Note (Signed)
Encounter addended by: Annie Paras on: 01/08/2021 4:41 PM  Actions taken: Letter saved

## 2021-01-11 ENCOUNTER — Ambulatory Visit (INDEPENDENT_AMBULATORY_CARE_PROVIDER_SITE_OTHER): Payer: Medicare Other | Admitting: Family Medicine

## 2021-01-11 ENCOUNTER — Ambulatory Visit: Payer: Self-pay

## 2021-01-11 ENCOUNTER — Ambulatory Visit (INDEPENDENT_AMBULATORY_CARE_PROVIDER_SITE_OTHER): Payer: Medicare Other

## 2021-01-11 ENCOUNTER — Other Ambulatory Visit: Payer: Self-pay

## 2021-01-11 DIAGNOSIS — M25551 Pain in right hip: Secondary | ICD-10-CM
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
## 2021-01-20 DIAGNOSIS — M542 Cervicalgia: Secondary | ICD-10-CM | POA: Diagnosis not present

## 2021-01-20 DIAGNOSIS — M9903 Segmental and somatic dysfunction of lumbar region: Secondary | ICD-10-CM | POA: Diagnosis not present

## 2021-01-20 DIAGNOSIS — M5417 Radiculopathy, lumbosacral region: Secondary | ICD-10-CM | POA: Diagnosis not present

## 2021-01-20 DIAGNOSIS — M9901 Segmental and somatic dysfunction of cervical region: Secondary | ICD-10-CM | POA: Diagnosis not present

## 2021-01-20 DIAGNOSIS — M546 Pain in thoracic spine: Secondary | ICD-10-CM | POA: Diagnosis not present

## 2021-01-20 DIAGNOSIS — M9902 Segmental and somatic dysfunction of thoracic region: Secondary | ICD-10-CM | POA: Diagnosis not present

## 2021-01-20 DIAGNOSIS — M62838 Other muscle spasm: Secondary | ICD-10-CM | POA: Diagnosis not present

## 2021-01-21 ENCOUNTER — Telehealth (INDEPENDENT_AMBULATORY_CARE_PROVIDER_SITE_OTHER): Payer: Self-pay | Admitting: Otolaryngology

## 2021-01-21 NOTE — Telephone Encounter (Signed)
Called patient concerning recent results of her ultrasound of her thyroid gland.  On review of the ultrasound the right mid thyroid nodule has not changed in size significantly over the past year and has been stable and needle aspirate of this was consistent with probable benign lesion.  However a right inferior nodule was identified retrospectively it has increased slightly in size and is solid and mildly suspicious and an ultrasound this met criteria for fine-needle aspirate.  I reviewed the ultrasound with the patient in the office today and she would like to go ahead and have fine-needle aspirate performed of the right inferior thyroid nodule.  We will plan on scheduling this over the next few weeks.

## 2021-01-25 ENCOUNTER — Encounter: Payer: Self-pay | Admitting: Registered Nurse

## 2021-01-25 ENCOUNTER — Ambulatory Visit (INDEPENDENT_AMBULATORY_CARE_PROVIDER_SITE_OTHER): Payer: Medicare Other | Admitting: Registered Nurse

## 2021-01-25 ENCOUNTER — Other Ambulatory Visit: Payer: Self-pay

## 2021-01-25 VITALS — Temp 98.2°F | Resp 18 | Ht 70.0 in | Wt 316.2 lb

## 2021-01-25 DIAGNOSIS — E559 Vitamin D deficiency, unspecified: Secondary | ICD-10-CM | POA: Diagnosis not present

## 2021-01-25 DIAGNOSIS — Z130 Encounter for screening for diseases of the blood and blood-forming organs and certain disorders involving the immune mechanism: Secondary | ICD-10-CM | POA: Diagnosis not present

## 2021-01-25 DIAGNOSIS — E041 Nontoxic single thyroid nodule: Secondary | ICD-10-CM

## 2021-01-25 DIAGNOSIS — Z1329 Encounter for screening for other suspected endocrine disorder: Secondary | ICD-10-CM | POA: Diagnosis not present

## 2021-01-25 DIAGNOSIS — Z13220 Encounter for screening for lipoid disorders: Secondary | ICD-10-CM | POA: Diagnosis not present

## 2021-01-25 DIAGNOSIS — Z13228 Encounter for screening for other metabolic disorders: Secondary | ICD-10-CM | POA: Diagnosis not present

## 2021-01-25 NOTE — Progress Notes (Signed)
Established Patient Office Visit  Subjective:  Patient ID: Leslie Knapp, female    DOB: 1951-01-28  Age: 70 y.o. MRN: [id number]  CC:  Chief Complaint  Patient presents with   Ear Fullness    Patient states she is here for ear fullness and discuss thyroid and labs.    HPI Leslie Knapp presents for visit to est care.   Has a number of concerns.   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 Also noted significant emotional stress at that time.  Thyroid nodule Incidentally noted on imaging  Has had follow up thyroid US noting two nodules: Nodule#1: r mid lobe, stable since FNA performed on 11/04/19 Nodule#2: TI-RADS 3, meets criteria for FNA She had been referred to Dr. Lucia Gaskins for FNA by previous PCP but unfortunately has had trouble getting this scheduled. Would also be interested in getting scheduled with endo. Has been some time since blood work in primary care, a few years since last TSH which was wnl.  Bilateral hip pain Est with Dr. Junius Roads Recent joint injection, good effect OA in L>R, but notes pain is R>L S/p bilateral tka Extensive ortho hx detailed per chart.    Past Medical History:  Diagnosis Date   Acute saddle pulmonary embolism ([place]) 39/0300   Complication of anesthesia    wants neck supported during surgery due to hx of migraine caused by concussion   Concussion 2010, 2000 and 1998   periodiotic migraine and occ dizzines   COVID-19 09/2019   sx covid pneumonia,fever, chills body aches, took monoclonal antibody tx done symptoms resolved after 6 weeks   CTS (carpal tunnel syndrome)    Fatty liver disease, nonalcoholic    In [facility] Study liver biopsy 2010 did not show fatty liver   History of blood transfusion 1975    after knee surgery   IBS (irritable bowel syndrome)    C/D   Localized swelling of both lower legs    go down with propping up of legs   Morbid obesity (HCC)    Osteoarthritis    oa   Pneumonia 09/2019   PONV (postoperative nausea and vomiting)    watch neck position needs support or gets n/v   Thyroid nodule    sees ent dr Radene Journey for q year   Uterine polyp     Past Surgical History:  Procedure Laterality Date   CARDIAC CATHETERIZATION  03/21/2019   with pfo closure   HYSTEROSCOPY WITH D & C N/A 04/06/2020   Procedure: DILATATION AND CURETTAGE /HYSTEROSCOPY;  Surgeon: Lavonia Drafts, MD;  Location: [facility];  Service: Gynecology;  Laterality: N/A;   IVC FILTER INSERTION  07/07/2019   KNEE SURGERY  1975   Left Patella Tendon Replacement   LIVER BIOPSY  2010   PATENT FORAMEN OVALE(PFO) CLOSURE  03/21/2019   TONSILLECTOMY  1963   TOTAL KNEE ARTHROPLASTY  06.2005   Left   TOTAL KNEE ARTHROPLASTY  11.2005   Right   UTERINE POLYP REMOVAL  2010    Family History  Problem Relation Age of Onset   Stroke Mother 46       Deceased   Hypertension Mother    GI problems Mother    Arthritis Mother    Dementia  Mother    Heart defect Father 51       Deceased   Parkinson's disease Father    Varicose Veins Father    Stroke Maternal Grandfather    Arthritis Maternal Grandfather    Kidney failure Maternal Grandmother    Gallbladder disease Maternal Grandmother    Diverticulitis Sister        #1   Arthritis Sister    Healthy Sister        #2    Social History   Socioeconomic History   Marital status: Widowed    Spouse name: Not on file   Number of children: Not on file   Years of education: Not on file   Highest education level: Not on file  Occupational History   Occupation: retired  Tobacco Use   Smoking status: Never   Smokeless tobacco: Never  Vaping Use   Vaping Use: Never used  Substance and Sexual Activity   Alcohol use: No     Alcohol/week: 0.0 standard drinks   Drug use: No   Sexual activity: Never  Other Topics Concern   Not on file  Social History Narrative   Not on file   Social Determinants of Health   Financial Resource Strain: Low Risk    Difficulty of Paying Living Expenses: Not hard at all  Food Insecurity: No Food Insecurity   Worried About Charity fundraiser in the Last Year: Never true   [place] in the Last Year: Never true  Transportation Needs: No Transportation Needs   Lack of Transportation (Medical): No   Lack of Transportation (Non-Medical): No  Physical Activity: Insufficiently Active   Days of Exercise per Week: 3 days   Minutes of Exercise per Session: 30 min  Stress: No Stress Concern Present   Feeling of Stress : Not at all  Social Connections: Socially Isolated   Frequency of Communication with Friends and Family: More than three times a week   Frequency of Social Gatherings with Friends and Family: More than three times a week   Attends Religious Services: Never   Marine scientist or Organizations: No   Attends Archivist Meetings: Never   Marital Status: Widowed  Human resources officer Violence: Not At Risk   Fear of Current or Ex-Partner: No   Emotionally Abused: No   Physically Abused: No   Sexually Abused: No    Outpatient Medications Prior to Visit  Medication Sig Dispense Refill   Cholecalciferol (VITAMIN D-3) 1000 UNITS CAPS Take 1 each by mouth daily. Liquid d 3 5 drops per day     Digestive Enzymes (ENZYME DIGEST) CAPS Take by mouth as needed.      NATURAL C/ROSE HIPS 1000 MG tablet Take 2,000 mg by mouth daily.     NON FORMULARY ROLFING: Deep Tissue Massage, 2 Txs weekly as needed for joint pain     Trace Min CaCrCuFeKMgMnPSeZn (MINERALS PO) Take 1 oz by mouth daily.     ELIQUIS 5 MG TABS tablet Take 1 tablet by mouth twice daily 60 tablet 0   ibuprofen (ADVIL) 400 MG tablet Take 400 mg by mouth every 6 (six) hours as needed.     No  facility-administered medications prior to visit.    Allergies  Allergen Reactions   Celebrex [Celecoxib] Swelling and Rash   Lactose Intolerance (Gi) Other (See Comments)    ROS Review of Systems    Objective:    Physical Exam  Temp 98.2 F (  36.8 C) (Temporal)   Resp 18   Ht 5\' 10"  (1.778 m)   Wt (!) 316 lb 3.2 oz (143.4 kg)   SpO2 99%   BMI 45.37 kg/m  Wt Readings from Last 3 Encounters:  03/04/21 (!) 310 lb 1.9 oz (140.7 kg)  01/25/21 (!) 316 lb 3.2 oz (143.4 kg)  08/23/20 (!) 310 lb (140.6 kg)     Health Maintenance Due  Topic Date Due   COVID-19 Vaccine (1) Never done   INFLUENZA VACCINE  02/07/2021    There are no preventive care reminders to display for this patient.  Lab Results  Component Value Date   TSH 2.39 01/25/2021   Lab Results  Component Value Date   WBC 6.0 03/04/2021   HGB 15.4 (H) 03/04/2021   HCT 45.7 03/04/2021   MCV 89.6 03/04/2021   PLT 199 03/04/2021   Lab Results  Component Value Date   NA 139 03/04/2021   K 4.4 03/04/2021   CO2 29 03/04/2021   GLUCOSE 92 03/04/2021   BUN 20 03/04/2021   CREATININE 0.75 03/04/2021   BILITOT 0.6 03/04/2021   ALKPHOS 77 03/04/2021   AST 19 03/04/2021   ALT 13 03/04/2021   PROT 6.9 03/04/2021   ALBUMIN 4.1 03/04/2021   CALCIUM 9.9 03/04/2021   ANIONGAP 7 03/04/2021   GFR 74.02 01/25/2021   Lab Results  Component Value Date   CHOL 251 (H) 01/25/2021   Lab Results  Component Value Date   HDL 60.70 01/25/2021   Lab Results  Component Value Date   LDLCALC 151 (H) 01/25/2021   Lab Results  Component Value Date   TRIG 199.0 (H) 01/25/2021   Lab Results  Component Value Date   CHOLHDL 4 01/25/2021   Lab Results  Component Value Date   HGBA1C 5.7 05/21/2018      Assessment & Plan:   Problem List Items Addressed This Visit       Other   Vitamin D deficiency   Relevant Orders   Vitamin D (25 hydroxy) (Completed)   Other Visit Diagnoses     Thyroid nodule    -   Primary   Relevant Orders   CBC with Differential/Platelet (Completed)   Thyroid Panel With TSH (Completed)   Screening for endocrine, metabolic and immunity disorder       Relevant Orders   CBC with Differential/Platelet (Completed)   Comprehensive metabolic panel (Completed)   Thyroid Panel With TSH (Completed)   Lipid screening       Relevant Orders   Lipid panel (Completed)       No orders of the defined types were placed in this encounter.   Follow-up: No follow-ups on file.   PLAN Will US thyroid nodule. If suspicious, will refer for FNA/biopsy. Recheck vitamin d Collect cbc, cmp, lipid panel. Will follow up as warranted Return as scheduled Patient encouraged to call clinic with any questions, comments, or concerns.  Maximiano Coss, NP

## 2021-01-25 NOTE — Patient Instructions (Signed)
° ° ° °  If you have lab work done today you will be contacted with your lab results within the next 2 weeks.  If you have not heard from us then please contact us. The fastest way to get your results is to register for My Chart. ° ° °IF you received an x-ray today, you will receive an invoice from [facility]. Please contact [facility] at [phone] with questions or concerns regarding your invoice.  ° °IF you received labwork today, you will receive an invoice from [facility]. Please contact [facility] at [phone] with questions or concerns regarding your invoice.  ° °Our billing staff will not be able to assist you with questions regarding bills from these companies. ° °You will be contacted with the lab results as soon as they are available. The fastest way to get your results is to activate your My Chart account. Instructions are located on the last page of this paperwork. If you have not heard from us regarding the results in 2 weeks, please contact this office. °  ° ° ° °

## 2021-01-26 LAB — COMPREHENSIVE METABOLIC PANEL
ALT: 14 U/L (ref 0–35)
AST: 20 U/L (ref 0–37)
Albumin: 4.2 g/dL (ref 3.5–5.2)
Alkaline Phosphatase: 78 U/L (ref 39–117)
BUN: 20 mg/dL (ref 6–23)
CO2: 22 mEq/L (ref 19–32)
Calcium: 9.7 mg/dL (ref 8.4–10.5)
Chloride: 103 mEq/L (ref 96–112)
Creatinine, Ser: 0.81 mg/dL (ref 0.40–1.20)
GFR: 74.02 mL/min (ref >60.00)
Glucose, Bld: 92 mg/dL (ref 70–99)
Potassium: 4.5 mEq/L (ref 3.5–5.1)
Sodium: 139 mEq/L (ref 135–145)
Total Bilirubin: 0.6 mg/dL (ref 0.2–1.2)
Total Protein: 6.7 g/dL (ref 6.0–8.3)

## 2021-01-26 LAB — CBC WITH DIFFERENTIAL/PLATELET
Basophils Absolute: 0 10*3/uL (ref 0.0–0.1)
Basophils Relative: 0.6 % (ref 0.0–3.0)
Eosinophils Absolute: 0.2 10*3/uL (ref 0.0–0.7)
Eosinophils Relative: 2.8 % (ref 0.0–5.0)
HCT: 46.4 % — ABNORMAL HIGH (ref 36.0–46.0)
Hemoglobin: 15.9 g/dL — ABNORMAL HIGH (ref 12.0–15.0)
Lymphocytes Relative: 32.7 % (ref 12.0–46.0)
Lymphs Abs: 2.7 10*3/uL (ref 0.7–4.0)
MCHC: 34.3 g/dL (ref 30.0–36.0)
MCV: 88.3 fl (ref 78.0–100.0)
Monocytes Absolute: 0.5 10*3/uL (ref 0.1–1.0)
Monocytes Relative: 6.5 % (ref 3.0–12.0)
Neutro Abs: 4.7 10*3/uL (ref 1.4–7.7)
Neutrophils Relative %: 57.4 % (ref 43.0–77.0)
Platelets: 205 10*3/uL (ref 150.0–400.0)
RBC: 5.26 Mil/uL — ABNORMAL HIGH (ref 3.87–5.11)
RDW: 13.3 % (ref 11.5–15.5)
WBC: 8.2 10*3/uL (ref 4.0–10.5)

## 2021-01-26 LAB — LIPID PANEL
Cholesterol: 251 mg/dL — ABNORMAL HIGH (ref 0–200)
HDL: 60.7 mg/dL (ref >39.00)
LDL Cholesterol: 151 mg/dL — ABNORMAL HIGH (ref 0–99)
NonHDL: 190.54
Total CHOL/HDL Ratio: 4
Triglycerides: 199 mg/dL — ABNORMAL HIGH (ref 0.0–149.0)
VLDL: 39.8 mg/dL (ref 0.0–40.0)

## 2021-01-26 LAB — THYROID PANEL WITH TSH
Free Thyroxine Index: 2.5 (ref 1.4–3.8)
T3 Uptake: 27 % (ref 22–35)
T4, Total: 9.3 ug/dL (ref 5.1–11.9)
TSH: 2.39 mIU/L (ref 0.40–4.50)

## 2021-01-26 LAB — VITAMIN D 25 HYDROXY (VIT D DEFICIENCY, FRACTURES): VITD: 51.67 ng/mL (ref 30.00–100.00)

## 2021-01-27 DIAGNOSIS — M5417 Radiculopathy, lumbosacral region: Secondary | ICD-10-CM | POA: Diagnosis not present

## 2021-01-27 DIAGNOSIS — M9902 Segmental and somatic dysfunction of thoracic region: Secondary | ICD-10-CM | POA: Diagnosis not present

## 2021-01-27 DIAGNOSIS — M546 Pain in thoracic spine: Secondary | ICD-10-CM | POA: Diagnosis not present

## 2021-01-27 DIAGNOSIS — M542 Cervicalgia: Secondary | ICD-10-CM | POA: Diagnosis not present

## 2021-01-27 DIAGNOSIS — M62838 Other muscle spasm: Secondary | ICD-10-CM | POA: Diagnosis not present

## 2021-01-27 DIAGNOSIS — M9903 Segmental and somatic dysfunction of lumbar region: Secondary | ICD-10-CM | POA: Diagnosis not present

## 2021-01-27 DIAGNOSIS — M9901 Segmental and somatic dysfunction of cervical region: Secondary | ICD-10-CM | POA: Diagnosis not present

## 2021-02-01 ENCOUNTER — Encounter: Payer: Self-pay | Admitting: Family Medicine

## 2021-02-01 ENCOUNTER — Other Ambulatory Visit (INDEPENDENT_AMBULATORY_CARE_PROVIDER_SITE_OTHER): Payer: Self-pay | Admitting: Otolaryngology

## 2021-02-01 DIAGNOSIS — M25551 Pain in right hip: Secondary | ICD-10-CM

## 2021-02-01 DIAGNOSIS — E041 Nontoxic single thyroid nodule: Secondary | ICD-10-CM

## 2021-02-03 ENCOUNTER — Encounter: Payer: Self-pay | Admitting: Registered Nurse

## 2021-02-07 ENCOUNTER — Ambulatory Visit (INDEPENDENT_AMBULATORY_CARE_PROVIDER_SITE_OTHER): Payer: Medicare Other | Admitting: *Deleted

## 2021-02-07 DIAGNOSIS — Z1211 Encounter for screening for malignant neoplasm of colon: Secondary | ICD-10-CM

## 2021-02-07 DIAGNOSIS — Z0000 Encounter for general adult medical examination without abnormal findings: Secondary | ICD-10-CM

## 2021-02-07 NOTE — Progress Notes (Signed)
Subjective:   Leslie Knapp is a 70 y.o. female who presents for Medicare Annual (Subsequent) preventive examination I connected with  [name] on 02/07/21 by a telephone enabled telemedicine application and verified that I am speaking with the correct person using two identifiers.   I discussed the limitations of evaluation and management by telemedicine. The patient expressed understanding and agreed to proceed.     Review of Systems    NA Cardiac Risk Factors include: advanced age (>52mn, >>46women);obesity (BMI >30kg/m2)     Objective:    Today's Vitals   02/07/21 0816  PainSc: 0-No pain   There is no height or weight on file to calculate BMI.  Advanced Directives 02/07/2021 04/06/2020 03/22/2020 02/02/2020 12/09/2019 10/03/2019 08/11/2019  Does Patient Have a Medical Advance Directive? Yes - Yes Yes Yes Yes Yes  Type of AParamedicof AMarble FallsLiving will HN[place]Living will H[place]Living will H[place]otes[place]Living will HLavonLiving will HGoodfieldLiving will  Does patient want to make changes to medical advance directive? - No - Patient declined No - Patient declined - No - Patient declined - No - Patient declined  Copy of HGlen Echo Parkin Chart? Yes - validated most recent copy scanned in chart (See row information) Yes - validated most recent copy scanned in chart (See row information) Yes - validated most recent copy scanned in chart (See row information) Yes - validated most recent copy scanned in chart (See row information) No - copy requested - No - copy requested  Would patient like information on creating a medical advance directive? - - - - - - No - Patient declined    Current Medications (verified) Outpatient Encounter Medications as of 02/07/2021  Medication Sig   Cholecalciferol (VITAMIN D-3) 1000  UNITS CAPS Take 1 each by mouth daily. Liquid d 3 5 drops per day   Digestive Enzymes (ENZYME DIGEST) CAPS Take by mouth as needed.    ELIQUIS 5 MG TABS tablet Take 1 tablet by mouth twice daily   ibuprofen (ADVIL) 400 MG tablet Take 400 mg by mouth every 6 (six) hours as needed.   NATURAL C/ROSE HIPS 1000 MG tablet Take 2,000 mg by mouth daily.   NON FORMULARY ROLFING: Deep Tissue Massage, 2 Txs weekly as needed for joint pain   Trace Min CaCrCuFeKMgMnPSeZn (MINERALS PO) Take 1 oz by mouth daily.   No facility-administered encounter medications on file as of 02/07/2021.    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   Concussion 2010, 2000 and 1998   periodiotic migraine and occ dizzines   COVID-19 09/2019   sx covid pneumonia,fever, chills body aches, took monoclonal antibody tx done symptoms resolved after 6 weeks   CTS (carpal tunnel syndrome)    Fatty liver disease, nonalcoholic    In [facility] Study liver biopsy 2010 did not show fatty liver   History of blood transfusion 1975   after knee surgery   IBS (irritable bowel syndrome)    C/D   Localized swelling of both lower legs    go down with propping up of legs   Morbid obesity ([place]    Osteoarthritis  oa   Pneumonia 09/2019   PONV (postoperative nausea and vomiting)    watch neck position needs support or gets n/v   Thyroid nodule    sees ent dr Radene Journey for q year   Uterine polyp    Past Surgical History:  Procedure Laterality Date   CARDIAC CATHETERIZATION  03/21/2019   with pfo closure   HYSTEROSCOPY WITH D & C N/A 04/06/2020   Procedure: DILATATION AND CURETTAGE /HYSTEROSCOPY;  Surgeon: Lavonia Drafts, MD;  Location: [place] [facility];  Service: Gynecology;   Laterality: N/A;   IVC FILTER INSERTION  07/07/2019   KNEE SURGERY  1975   Left Patella Tendon Replacement   LIVER BIOPSY  2010   PATENT FORAMEN OVALE(PFO) CLOSURE  03/21/2019   TONSILLECTOMY  1963   TOTAL KNEE ARTHROPLASTY  06.2005   Left   TOTAL KNEE ARTHROPLASTY  11.2005   Right   UTERINE POLYP REMOVAL  2010   Family History  Problem Relation Age of Onset   Stroke Mother 44       Deceased   Hypertension Mother    GI problems Mother    Arthritis Mother    Dementia Mother    Heart defect Father 57       Deceased   Parkinson's disease Father    Varicose Veins Father    Stroke Maternal Grandfather    Arthritis Maternal Grandfather    Kidney failure Maternal Grandmother    Gallbladder disease Maternal Grandmother    Diverticulitis Sister        #1   Arthritis Sister    Healthy Sister        #2   Social History   Socioeconomic History   Marital status: Widowed    Spouse name: Not on file   Number of children: Not on file   Years of education: Not on file   Highest education level: Not on file  Occupational History   Occupation: retired  Tobacco Use   Smoking status: Never   Smokeless tobacco: Never  Vaping Use   Vaping Use: Never used  Substance and Sexual Activity   Alcohol use: No    Alcohol/week: 0.0 standard drinks   Drug use: No   Sexual activity: Never  Other Topics Concern   Not on file  Social History Narrative   Not on file   Social Determinants of Health   Financial Resource Strain: Low Risk    Difficulty of Paying Living Expenses: Not hard at all  Food Insecurity: No Food Insecurity   Worried About Charity fundraiser in the Last Year: Never true   [place] in the Last Year: Never true  Transportation Needs: No Transportation Needs   Lack of Transportation (Medical): No   Lack of Transportation (Non-Medical): No  Physical Activity: Insufficiently Active   Days of Exercise per Week: 3 days   Minutes of Exercise per Session: 30 min   Stress: No Stress Concern Present   Feeling of Stress : Not at all  Social Connections: Socially Isolated   Frequency of Communication with Friends and Family: More than three times a week   Frequency of Social Gatherings with Friends and Family: More than three times a week   Attends Religious Services: Never   Marine scientist or Organizations: No   Attends Archivist Meetings: Never   Marital Status: Widowed    Tobacco Counseling Counseling given: Not Answered   Clinical Intake:  Pre-visit preparation  completed: Yes  Pain : 0-10 Pain Score: 0-No pain Pain Type: Chronic pain Pain Location: Hip Pain Descriptors / Indicators: Burning, Aching Pain Onset: In the past 7 days Pain Frequency: Intermittent Pain Relieving Factors: at night take 2 ibuprofen  Pain Relieving Factors: at night take 2 ibuprofen  Nutritional Risks: None Diabetes: No  How often do you need to have someone help you when you read instructions, pamphlets, or other written materials from your doctor or pharmacy?: 1 - Never  Diabetic?    No  Interpreter Needed?: No  Information entered by :: Leroy Kennedy LPN   Activities of Daily Living In your present state of health, do you have any difficulty performing the following activities: 02/07/2021 04/06/2020  Hearing? N N  Vision? N N  Difficulty concentrating or making decisions? N N  Walking or climbing stairs? N Y  Dressing or bathing? N Y  Doing errands, shopping? N -  Preparing Food and eating ? N -  Using the Toilet? N -  In the past six months, have you accidently leaked urine? N -  Do you have problems with loss of bowel control? N -  Managing your Medications? N -  Managing your Finances? N -  Housekeeping or managing your Housekeeping? N -  Some recent data might be hidden    Patient Care Team: Maximiano Coss, NP as PCP - General (Adult Health Nurse Practitioner) Shiela Mayer, MD as Referring Physician  (Gastroenterology) Sheryn Bison, MD as Referring Physician (Dermatology) Avie Echevaria., MD as Referring Physician (Sports Medicine) Shearon Balo, [place] (Chiropractic Medicine) Calvert Cantor, MD as Consulting Physician (Ophthalmology) Rozetta Nunnery, MD as Consulting Physician (Otolaryngology)  Indicate any recent Medical Services you may have received from other than [facility] providers in the past year (date may be approximate).     Assessment:   This is a routine wellness examination for Amika.  Hearing/Vision screen Hearing Screening - Comments:: No trouble hearing Vision Screening - Comments:: [facility] Upt to date  Dietary issues and exercise activities discussed: Current Exercise Habits: Structured exercise class (ymca pool), Time (Minutes): 40, Frequency (Times/Week): 3, Weekly Exercise (Minutes/Week): 120, Intensity: Mild   Goals Addressed             This Visit's Progress    Patient Stated       To have pain free hip and back pain       Depression Screen PHQ 2/9 Scores 02/07/2021 01/25/2021 02/02/2020 10/24/2018 06/27/2018 06/07/2017 01/29/2017  PHQ - 2 Score 0 0 0 1 0 0 1  PHQ- 9 Score - - - 3 - - 3    Fall Risk Fall Risk  02/07/2021 01/25/2021 02/02/2020 10/24/2018 06/27/2018  Falls in the past year? 0 0 0 0 0  Number falls in past yr: 0 0 0 0 0  Injury with Fall? 0 0 0 0 0  Risk for fall due to : - No Fall Risks - - -  Follow up Falls evaluation completed;Falls prevention discussed Falls evaluation completed Falls prevention discussed Falls evaluation completed -    FALL RISK PREVENTION PERTAINING TO THE HOME:  Any stairs in or around the home? Yes  If so, are there any without handrails? Yes  Home free of loose throw rugs in walkways, pet beds, electrical cords, etc? Yes  Adequate lighting in your home to reduce risk of falls? Yes   ASSISTIVE DEVICES UTILIZED TO PREVENT FALLS:  Life alert? No  Use of a cane, walker  or w/c? No  Grab bars  in the bathroom? Yes  Shower chair or bench in shower? No  Elevated toilet seat or a handicapped toilet? No   TIMED UP AND GO:  Was the test performed? No .      Cognitive Function: MMSE - Mini Mental State Exam 06/27/2018 06/07/2017  Orientation to time 5 5  Orientation to Place 5 5  Registration 3 3  Attention/ Calculation 5 5  Recall 3 3  Language- name 2 objects 2 2  Language- repeat 1 1  Language- follow 3 step command 3 3  Language- read & follow direction 1 1  Write a sentence 1 1  Copy design 1 1  Total score 30 30     6CIT Screen 02/02/2020  What Year? 0 points  What month? 0 points  What time? 0 points  Count back from 20 0 points  Months in reverse 0 points  Repeat phrase 0 points  Total Score 0    Immunizations Immunization History  Administered Date(s) Administered   Influenza,inj,Quad PF,6+ Mos 04/07/2016, 06/07/2017   Pneumococcal Conjugate-13 06/07/2017   Pneumococcal Polysaccharide-23 06/27/2018   Td 07/11/2017    TDAP status: Up to date  Flu Vaccine status: Due, Education has been provided regarding the importance of this vaccine. Advised may receive this vaccine at local pharmacy or Health Dept. Aware to provide a copy of the vaccination record if obtained from local pharmacy or Health Dept. Verbalized acceptance and understanding.  Pneumococcal vaccine status: Up to date  Covid-19 vaccine status: Declined, Education has been provided regarding the importance of this vaccine but patient still declined. Advised may receive this vaccine at local pharmacy or Health Dept.or vaccine clinic. Aware to provide a copy of the vaccination record if obtained from local pharmacy or Health Dept. Verbalized acceptance and understanding.  Qualifies for Shingles Vaccine? Yes   Zostavax completed Yes   Shingrix Completed?: Yes  Screening Tests Health Maintenance  Topic Date Due   INFLUENZA VACCINE  02/07/2021   COVID-19 Vaccine (1) 02/10/2021 (Originally  07/27/1955)   Zoster Vaccines- Shingrix (1 of 2) 04/27/2021 (Originally 07/27/1969)   DEXA SCAN  01/25/2022 (Originally 07/27/2016)   COLONOSCOPY (Pts 45-88yr Insurance coverage will need to be confirmed)  01/25/2022 (Originally 07/11/2019)   MAMMOGRAM  11/17/2022   TETANUS/TDAP  07/12/2027   Hepatitis C Screening  Completed   PNA vac Low Risk Adult  Completed   HPV VACCINES  Aged Out    Health Maintenance  Health Maintenance Due  Topic Date Due   INFLUENZA VACCINE  02/07/2021    Colorectal cancer screening: Referral to GI placed  . Pt aware the office will call re: appt.  Mammogram status: Completed  . Repeat every year  Bone Density patient declined  Lung Cancer Screening: (Low Dose CT Chest recommended if Age 69-80years, 30 pack-year currently smoking OR have quit w/in 15years.) does not qualify.   Lung Cancer Screening Referral:   NA  Additional Screening:  Hepatitis C Screening: does not qualify; Completed   Vision Screening: Recommended annual ophthalmology exams for early detection of glaucoma and other disorders of the eye. Is the patient up to date with their annual eye exam?  Yes  Who is the provider or what is the name of the office in which the patient attends annual eye exams? [facility]If pt is not established with a provider, would they like to be referred to a provider to establish care? No .   Dental  Screening: Recommended annual dental exams for proper oral hygiene  Community Resource Referral / Chronic Care Management: CRR required this visit?  No   CCM required this visit?  No      Plan:     I have personally reviewed and noted the following in the patient's chart:   Medical and social history Use of alcohol, tobacco or illicit drugs  Current medications and supplements including opioid prescriptions.  Functional ability and status Nutritional status Physical activity Advanced directives List of other physicians Hospitalizations, surgeries, and  ER visits in previous 12 months Vitals Screenings to include cognitive, depression, and falls Referrals and appointments  In addition, I have reviewed and discussed with patient certain preventive protocols, quality metrics, and best practice recommendations. A written personalized care plan for preventive services as well as general preventive health recommendations were provided to patient.     Leroy Kennedy, LPN   [id number]   Nurse Notes: NA

## 2021-02-07 NOTE — Patient Instructions (Signed)
Leslie Knapp , Thank you for taking time to come for your Medicare Wellness Visit. I appreciate your ongoing commitment to your health goals. Please review the following plan we discussed and let me know if I can assist you in the future.   Screening recommendations/referrals: Colonoscopy: education provided Mammogram: up to date Bone Density: education provided Recommended yearly ophthalmology/optometry visit for glaucoma screening and checkup Recommended yearly dental visit for hygiene and checkup  Vaccinations: Influenza vaccine: Education provided Pneumococcal vaccine: Education provided Tdap vaccine: up to date Shingles vaccine: Education provided    Advanced directives: on file  Conditions/risks identified: na    Preventive Care 43 Years and Older, Female Preventive care refers to lifestyle choices and visits with your health care provider that can promote health and wellness. What does preventive care include? A yearly physical exam. This is also called an annual well check. Dental exams once or twice a year. Routine eye exams. Ask your health care provider how often you should have your eyes checked. Personal lifestyle choices, including: Daily care of your teeth and gums. Regular physical activity. Eating a healthy diet. Avoiding tobacco and drug use. Limiting alcohol use. Practicing safe sex. Taking low-dose aspirin every day. Taking vitamin and mineral supplements as recommended by your health care provider. What happens during an annual well check? The services and screenings done by your health care provider during your annual well check will depend on your age, overall health, lifestyle risk factors, and family history of disease. Counseling  Your health care provider may ask you questions about your: Alcohol use. Tobacco use. Drug use. Emotional well-being. Home and relationship well-being. Sexual activity. Eating habits. History of falls. Memory and  ability to understand (cognition). Work and work Statistician. Reproductive health. Screening  You may have the following tests or measurements: Height, weight, and BMI. Blood pressure. Lipid and cholesterol levels. These may be checked every 5 years, or more frequently if you are over 45 years old. Skin check. Lung cancer screening. You may have this screening every year starting at age 68 if you have a 30-pack-year history of smoking and currently smoke or have quit within the past 15 years. Fecal occult blood test (FOBT) of the stool. You may have this test every year starting at age 48. Flexible sigmoidoscopy or colonoscopy. You may have a sigmoidoscopy every 5 years or a colonoscopy every 10 years starting at age 82. Hepatitis C blood test. Hepatitis B blood test. Sexually transmitted disease (STD) testing. Diabetes screening. This is done by checking your blood sugar (glucose) after you have not eaten for a while (fasting). You may have this done every 1-3 years. Bone density scan. This is done to screen for osteoporosis. You may have this done starting at age 46. Mammogram. This may be done every 1-2 years. Talk to your health care provider about how often you should have regular mammograms. Talk with your health care provider about your test results, treatment options, and if necessary, the need for more tests. Vaccines  Your health care provider may recommend certain vaccines, such as: Influenza vaccine. This is recommended every year. Tetanus, diphtheria, and acellular pertussis (Tdap, Td) vaccine. You may need a Td booster every 10 years. Zoster vaccine. You may need this after age 74. Pneumococcal 13-valent conjugate (PCV13) vaccine. One dose is recommended after age 23. Pneumococcal polysaccharide (PPSV23) vaccine. One dose is recommended after age 7. Talk to your health care provider about which screenings and vaccines you need and how often  you need them. This information is  not intended to replace advice given to you by your health care provider. Make sure you discuss any questions you have with your health care provider. Document Released: 07/23/2015 Document Revised: 03/15/2016 Document Reviewed: 04/27/2015 Elsevier Interactive Patient Education  2017 [place] Prevention in the Home Falls can cause injuries. They can happen to people of all ages. There are many things you can do to make your home safe and to help prevent falls. What can I do on the outside of my home? Regularly fix the edges of walkways and driveways and fix any cracks. Remove anything that might make you trip as you walk through a door, such as a raised step or threshold. Trim any bushes or trees on the path to your home. Use bright outdoor lighting. Clear any walking paths of anything that might make someone trip, such as rocks or tools. Regularly check to see if handrails are loose or broken. Make sure that both sides of any steps have handrails. Any raised decks and porches should have guardrails on the edges. Have any leaves, snow, or ice cleared regularly. Use sand or salt on walking paths during winter. Clean up any spills in your garage right away. This includes oil or grease spills. What can I do in the bathroom? Use night lights. Install grab bars by the toilet and in the tub and shower. Do not use towel bars as grab bars. Use non-skid mats or decals in the tub or shower. If you need to sit down in the shower, use a plastic, non-slip stool. Keep the floor dry. Clean up any water that spills on the floor as soon as it happens. Remove soap buildup in the tub or shower regularly. Attach bath mats securely with double-sided non-slip rug tape. Do not have throw rugs and other things on the floor that can make you trip. What can I do in the bedroom? Use night lights. Make sure that you have a light by your bed that is easy to reach. Do not use any sheets or blankets that  are too big for your bed. They should not hang down onto the floor. Have a firm chair that has side arms. You can use this for support while you get dressed. Do not have throw rugs and other things on the floor that can make you trip. What can I do in the kitchen? Clean up any spills right away. Avoid walking on wet floors. Keep items that you use a lot in easy-to-reach places. If you need to reach something above you, use a strong step stool that has a grab bar. Keep electrical cords out of the way. Do not use floor polish or wax that makes floors slippery. If you must use wax, use non-skid floor wax. Do not have throw rugs and other things on the floor that can make you trip. What can I do with my stairs? Do not leave any items on the stairs. Make sure that there are handrails on both sides of the stairs and use them. Fix handrails that are broken or loose. Make sure that handrails are as long as the stairways. Check any carpeting to make sure that it is firmly attached to the stairs. Fix any carpet that is loose or worn. Avoid having throw rugs at the top or bottom of the stairs. If you do have throw rugs, attach them to the floor with carpet tape. Make sure that you have a light  switch at the top of the stairs and the bottom of the stairs. If you do not have them, ask someone to add them for you. What else can I do to help prevent falls? Wear shoes that: Do not have high heels. Have rubber bottoms. Are comfortable and fit you well. Are closed at the toe. Do not wear sandals. If you use a stepladder: Make sure that it is fully opened. Do not climb a closed stepladder. Make sure that both sides of the stepladder are locked into place. Ask someone to hold it for you, if possible. Clearly mark and make sure that you can see: Any grab bars or handrails. First and last steps. Where the edge of each step is. Use tools that help you move around (mobility aids) if they are needed. These  include: Canes. Walkers. Scooters. Crutches. Turn on the lights when you go into a dark area. Replace any light bulbs as soon as they burn out. Set up your furniture so you have a clear path. Avoid moving your furniture around. If any of your floors are uneven, fix them. If there are any pets around you, be aware of where they are. Review your medicines with your doctor. Some medicines can make you feel dizzy. This can increase your chance of falling. Ask your doctor what other things that you can do to help prevent falls. This information is not intended to replace advice given to you by your health care provider. Make sure you discuss any questions you have with your health care provider. Document Released: 04/22/2009 Document Revised: 12/02/2015 Document Reviewed: 07/31/2014 Elsevier Interactive Patient Education  2017 [employer].

## 2021-02-10 DIAGNOSIS — M9901 Segmental and somatic dysfunction of cervical region: Secondary | ICD-10-CM | POA: Diagnosis not present

## 2021-02-10 DIAGNOSIS — M546 Pain in thoracic spine: Secondary | ICD-10-CM | POA: Diagnosis not present

## 2021-02-10 DIAGNOSIS — M62838 Other muscle spasm: Secondary | ICD-10-CM | POA: Diagnosis not present

## 2021-02-10 DIAGNOSIS — M9902 Segmental and somatic dysfunction of thoracic region: Secondary | ICD-10-CM | POA: Diagnosis not present

## 2021-02-10 DIAGNOSIS — M5417 Radiculopathy, lumbosacral region: Secondary | ICD-10-CM | POA: Diagnosis not present

## 2021-02-10 DIAGNOSIS — M542 Cervicalgia: Secondary | ICD-10-CM | POA: Diagnosis not present

## 2021-02-10 DIAGNOSIS — M9903 Segmental and somatic dysfunction of lumbar region: Secondary | ICD-10-CM | POA: Diagnosis not present

## 2021-02-13 ENCOUNTER — Other Ambulatory Visit: Payer: Self-pay

## 2021-02-13 ENCOUNTER — Other Ambulatory Visit: Payer: Medicare Other

## 2021-02-13 ENCOUNTER — Ambulatory Visit
Admission: RE | Admit: 2021-02-13 | Discharge: 2021-02-13 | Disposition: A | Discharge status: Home / Self Care | Payer: Medicare Other | Source: Ambulatory Visit | Attending: Family Medicine | Admitting: Family Medicine

## 2021-02-13 DIAGNOSIS — M25551 Pain in right hip: Secondary | ICD-10-CM | POA: Diagnosis not present

## 2021-02-14 ENCOUNTER — Encounter: Payer: Self-pay | Admitting: Hematology & Oncology

## 2021-02-14 ENCOUNTER — Telehealth: Payer: Self-pay | Admitting: Family Medicine

## 2021-02-14 NOTE — Telephone Encounter (Signed)
Right hip MRI scan shows moderate arthritis.  No other abnormality seen.  Lumbar MRI scan is still pending.

## 2021-02-19 ENCOUNTER — Other Ambulatory Visit: Payer: Medicare Other

## 2021-02-21 ENCOUNTER — Ambulatory Visit: Payer: Medicare Other | Admitting: Hematology & Oncology

## 2021-02-21 ENCOUNTER — Other Ambulatory Visit: Payer: Medicare Other

## 2021-02-21 ENCOUNTER — Inpatient Hospital Stay: Payer: Medicare Other

## 2021-02-21 ENCOUNTER — Inpatient Hospital Stay: Payer: Medicare Other | Admitting: Hematology & Oncology

## 2021-02-23 ENCOUNTER — Ambulatory Visit
Admission: RE | Admit: 2021-02-23 | Discharge: 2021-02-23 | Disposition: A | Discharge status: Home / Self Care | Payer: Medicare Other | Source: Ambulatory Visit | Attending: Otolaryngology | Admitting: Otolaryngology

## 2021-02-23 ENCOUNTER — Other Ambulatory Visit (HOSPITAL_COMMUNITY)
Admission: RE | Admit: 2021-02-23 | Discharge: 2021-02-23 | Disposition: A | Discharge status: Home / Self Care | Payer: Medicare Other | Source: Ambulatory Visit | Attending: Otolaryngology | Admitting: Otolaryngology

## 2021-02-23 DIAGNOSIS — R041 Hemorrhage from throat: Secondary | ICD-10-CM | POA: Diagnosis not present

## 2021-02-23 DIAGNOSIS — E041 Nontoxic single thyroid nodule: Secondary | ICD-10-CM | POA: Diagnosis not present

## 2021-02-24 DIAGNOSIS — M9903 Segmental and somatic dysfunction of lumbar region: Secondary | ICD-10-CM | POA: Diagnosis not present

## 2021-02-24 DIAGNOSIS — M5417 Radiculopathy, lumbosacral region: Secondary | ICD-10-CM | POA: Diagnosis not present

## 2021-02-24 DIAGNOSIS — M62838 Other muscle spasm: Secondary | ICD-10-CM | POA: Diagnosis not present

## 2021-02-24 DIAGNOSIS — M9902 Segmental and somatic dysfunction of thoracic region: Secondary | ICD-10-CM | POA: Diagnosis not present

## 2021-02-24 DIAGNOSIS — M542 Cervicalgia: Secondary | ICD-10-CM | POA: Diagnosis not present

## 2021-02-24 DIAGNOSIS — M546 Pain in thoracic spine: Secondary | ICD-10-CM | POA: Diagnosis not present

## 2021-02-24 DIAGNOSIS — M9901 Segmental and somatic dysfunction of cervical region: Secondary | ICD-10-CM | POA: Diagnosis not present

## 2021-02-24 LAB — CYTOLOGY - NON PAP

## 2021-02-25 ENCOUNTER — Other Ambulatory Visit: Payer: Self-pay

## 2021-02-25 ENCOUNTER — Ambulatory Visit
Admission: RE | Admit: 2021-02-25 | Discharge: 2021-02-25 | Disposition: A | Discharge status: Home / Self Care | Payer: Medicare Other | Source: Ambulatory Visit | Attending: Family Medicine | Admitting: Family Medicine

## 2021-02-25 DIAGNOSIS — M48061 Spinal stenosis, lumbar region without neurogenic claudication: Secondary | ICD-10-CM | POA: Diagnosis not present

## 2021-02-25 DIAGNOSIS — M25551 Pain in right hip: Secondary | ICD-10-CM

## 2021-02-25 DIAGNOSIS — M545 Low back pain, unspecified: Secondary | ICD-10-CM | POA: Diagnosis not present

## 2021-02-28 ENCOUNTER — Telehealth: Payer: Self-pay | Admitting: Family Medicine

## 2021-02-28 NOTE — Telephone Encounter (Signed)
Lumbar MRI scan is notable for severe narrowing of the spinal canal at the L3-4 level due to a disc bulge and bone spurs.  There is severe arthritis in the facet joints at L4-5 but no nerve compression at that level.  There is similar arthritis at the L5-S1 level.  The stenosis at L3-4 is the most likely cause of your pain.  Could contemplate referral for epidural injection.  If still no relief, then possibly surgical consult.

## 2021-03-03 ENCOUNTER — Ambulatory Visit (INDEPENDENT_AMBULATORY_CARE_PROVIDER_SITE_OTHER): Payer: Medicare Other | Admitting: Family Medicine

## 2021-03-03 ENCOUNTER — Other Ambulatory Visit: Payer: Self-pay

## 2021-03-03 ENCOUNTER — Encounter: Payer: Self-pay | Admitting: Family Medicine

## 2021-03-03 DIAGNOSIS — M9901 Segmental and somatic dysfunction of cervical region: Secondary | ICD-10-CM | POA: Diagnosis not present

## 2021-03-03 DIAGNOSIS — M9902 Segmental and somatic dysfunction of thoracic region: Secondary | ICD-10-CM | POA: Diagnosis not present

## 2021-03-03 DIAGNOSIS — M5417 Radiculopathy, lumbosacral region: Secondary | ICD-10-CM | POA: Diagnosis not present

## 2021-03-03 DIAGNOSIS — M25551 Pain in right hip: Secondary | ICD-10-CM | POA: Diagnosis not present

## 2021-03-03 DIAGNOSIS — M542 Cervicalgia: Secondary | ICD-10-CM | POA: Diagnosis not present

## 2021-03-03 DIAGNOSIS — M62838 Other muscle spasm: Secondary | ICD-10-CM | POA: Diagnosis not present

## 2021-03-03 DIAGNOSIS — M9903 Segmental and somatic dysfunction of lumbar region: Secondary | ICD-10-CM | POA: Diagnosis not present

## 2021-03-03 DIAGNOSIS — M546 Pain in thoracic spine: Secondary | ICD-10-CM | POA: Diagnosis not present

## 2021-03-03 NOTE — Progress Notes (Signed)
Office Visit Note   Patient: Leslie Knapp           Date of Birth: [birth year]           MRN: [id number] Visit Date: 03/03/2021 Requested by: Maximiano Coss, NP [address] [place],  [place] [id number] PCP: Maximiano Coss, NP  Subjective: Chief Complaint  Patient presents with   Lower Back - Pain, Follow-up    Post MRIs of right hip and Lsp   Right Hip - Pain, Follow-up    HPI: She is here for follow-up chronic right hip and low back pain.  Lumbar MRI scan showed severe stenosis at the L3-4 level.  Hip MRI shows arthritis with subchondral cystic change.  Diagnostic hip injection gave very good relief during the anesthetic phase and modest improvement for a week.  She is trying to lose weight, she has lost 8 pounds so far.  Her BMI is around 43.               ROS:   All other systems were reviewed and are negative.  Objective: Vital Signs: There were no vitals taken for this visit.  Physical Exam:  General:  Alert and oriented, in no acute distress. Pulm:  Breathing unlabored. Psy:  Normal mood, congruent affect.  No exam done today.  Imaging: MRI images were reviewed on computer with the patient.    Assessment & Plan: Chronic right hip pain, suspect that it is mainly due to the hip osteoarthritis based on her response to diagnostic injection.  She does have lumbar stenosis as well. -I think she would benefit from replacement.  She will keep working aggressively on weight loss.  Follow-up in about 3 months to see Dr. Ninfa Linden.  Hopefully at that time she will have lost enough weight that she would be a candidate for replacement.     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   Abdominal pain 03/08/2019   Pure hypercholesterolemia 09/25/2017   Morbid obesity ([place]) 06/07/2017   Encounter for Medicare annual wellness exam 06/07/2017   Vitamin D deficiency 06/07/2017   IBS (irritable bowel syndrome) 08/29/2016   Migraine 08/29/2016   Disorder of musculoskeletal system 03/23/2015   Eye exam abnormal 03/23/2015   NASH (nonalcoholic steatohepatitis) 10/19/2011   Past Medical History:  Diagnosis Date   Acute saddle pulmonary embolism ([place]) [id number]   Complication of anesthesia    wants neck supported during surgery due to hx of migraine caused by concussion   Concussion 2010, 2000 and 1998   periodiotic migraine and occ dizzines   COVID-19 09/2019   sx covid pneumonia,fever, chills body aches, took monoclonal antibody tx done symptoms resolved after 6 weeks   CTS (carpal tunnel syndrome)    Fatty liver disease, nonalcoholic    In [facility] Study liver biopsy 2010 did not show fatty liver   History of blood transfusion 1975   after knee surgery   IBS (irritable bowel syndrome)    C/D   Localized swelling of both lower legs  go down with propping up of legs   Morbid obesity ([place])    Osteoarthritis    oa   Pneumonia 09/2019   PONV (postoperative nausea and vomiting)    watch neck position needs support or gets n/v   Thyroid nodule    sees ent dr Radene Journey for q year   Uterine polyp     Family History  Problem Relation Age of Onset   Stroke Mother 84       Deceased   Hypertension Mother    GI problems Mother    Arthritis Mother    Dementia Mother    Heart defect Father 53       Deceased   Parkinson's disease Father    Varicose Veins Father    Stroke Maternal Grandfather    Arthritis Maternal Grandfather    Kidney failure Maternal Grandmother    Gallbladder disease Maternal  Grandmother    Diverticulitis Sister        #1   Arthritis Sister    Healthy Sister        #2    Past Surgical History:  Procedure Laterality Date   CARDIAC CATHETERIZATION  03/21/2019   with pfo closure   HYSTEROSCOPY WITH D & C N/A 04/06/2020   Procedure: DILATATION AND CURETTAGE /HYSTEROSCOPY;  Surgeon: Lavonia Drafts, MD;  Location: [facility];  Service: Gynecology;  Laterality: N/A;   IVC FILTER INSERTION  07/07/2019   KNEE SURGERY  1975   Left Patella Tendon Replacement   LIVER BIOPSY  2010   PATENT FORAMEN OVALE(PFO) CLOSURE  03/21/2019   TONSILLECTOMY  1963   TOTAL KNEE ARTHROPLASTY  06.2005   Left   TOTAL KNEE ARTHROPLASTY  11.2005   Right   UTERINE POLYP REMOVAL  2010   Social History   Occupational History   Occupation: retired  Tobacco Use   Smoking status: Never   Smokeless tobacco: Never  Vaping Use   Vaping Use: Never used  Substance and Sexual Activity   Alcohol use: No    Alcohol/week: 0.0 standard drinks   Drug use: No   Sexual activity: Never

## 2021-03-04 ENCOUNTER — Encounter: Payer: Self-pay | Admitting: Hematology & Oncology

## 2021-03-04 ENCOUNTER — Inpatient Hospital Stay: Payer: Medicare Other | Attending: Hematology & Oncology | Admitting: Hematology & Oncology

## 2021-03-04 ENCOUNTER — Inpatient Hospital Stay: Payer: Medicare Other

## 2021-03-04 DIAGNOSIS — M48061 Spinal stenosis, lumbar region without neurogenic claudication: Secondary | ICD-10-CM | POA: Diagnosis not present

## 2021-03-04 DIAGNOSIS — M2550 Pain in unspecified joint: Secondary | ICD-10-CM | POA: Diagnosis not present

## 2021-03-04 DIAGNOSIS — Z886 Allergy status to analgesic agent status: Secondary | ICD-10-CM | POA: Insufficient documentation

## 2021-03-04 DIAGNOSIS — M160 Bilateral primary osteoarthritis of hip: Secondary | ICD-10-CM | POA: Diagnosis not present

## 2021-03-04 DIAGNOSIS — I742 Embolism and thrombosis of arteries of the upper extremities: Secondary | ICD-10-CM | POA: Insufficient documentation

## 2021-03-04 DIAGNOSIS — I2692 Saddle embolus of pulmonary artery without acute cor pulmonale: Secondary | ICD-10-CM | POA: Insufficient documentation

## 2021-03-04 DIAGNOSIS — D6859 Other primary thrombophilia: Secondary | ICD-10-CM | POA: Diagnosis not present

## 2021-03-04 DIAGNOSIS — M62838 Other muscle spasm: Secondary | ICD-10-CM | POA: Diagnosis not present

## 2021-03-04 DIAGNOSIS — Z7901 Long term (current) use of anticoagulants: Secondary | ICD-10-CM | POA: Diagnosis not present

## 2021-03-04 DIAGNOSIS — Q2112 Patent foramen ovale: Secondary | ICD-10-CM

## 2021-03-04 LAB — CBC WITH DIFFERENTIAL (CANCER CENTER ONLY)
Abs Immature Granulocytes: 0.01 10*3/uL (ref 0.00–0.07)
Basophils Absolute: 0 10*3/uL (ref 0.0–0.1)
Basophils Relative: 0 %
Eosinophils Absolute: 0.2 10*3/uL (ref 0.0–0.5)
Eosinophils Relative: 3 %
HCT: 45.7 % (ref 36.0–46.0)
Hemoglobin: 15.4 g/dL — ABNORMAL HIGH (ref 12.0–15.0)
Immature Granulocytes: 0 %
Lymphocytes Relative: 39 %
Lymphs Abs: 2.3 10*3/uL (ref 0.7–4.0)
MCH: 30.2 pg (ref 26.0–34.0)
MCHC: 33.7 g/dL (ref 30.0–36.0)
MCV: 89.6 fL (ref 80.0–100.0)
Monocytes Absolute: 0.4 10*3/uL (ref 0.1–1.0)
Monocytes Relative: 6 %
Neutro Abs: 3.1 10*3/uL (ref 1.7–7.7)
Neutrophils Relative %: 52 %
Platelet Count: 199 10*3/uL (ref 150–400)
RBC: 5.1 MIL/uL (ref 3.87–5.11)
RDW: 12.4 % (ref 11.5–15.5)
WBC Count: 6 10*3/uL (ref 4.0–10.5)
nRBC: 0 % (ref 0.0–0.2)

## 2021-03-04 LAB — CMP (CANCER CENTER ONLY)
ALT: 13 U/L (ref 0–44)
AST: 19 U/L (ref 15–41)
Albumin: 4.1 g/dL (ref 3.5–5.0)
Alkaline Phosphatase: 77 U/L (ref 38–126)
Anion gap: 7 (ref 5–15)
BUN: 20 mg/dL (ref 8–23)
CO2: 29 mmol/L (ref 22–32)
Calcium: 9.9 mg/dL (ref 8.9–10.3)
Chloride: 103 mmol/L (ref 98–111)
Creatinine: 0.75 mg/dL (ref 0.44–1.00)
GFR, Estimated: >60 mL/min (ref >60)
Glucose, Bld: 92 mg/dL (ref 70–99)
Potassium: 4.4 mmol/L (ref 3.5–5.1)
Sodium: 139 mmol/L (ref 135–145)
Total Bilirubin: 0.6 mg/dL (ref 0.3–1.2)
Total Protein: 6.9 g/dL (ref 6.5–8.1)

## 2021-03-04 MED ORDER — APIXABAN 2.5 MG PO TABS: 2.5000 mg | ORAL_TABLET | Freq: Two times a day (BID) | ORAL | 12 refills | Status: DC

## 2021-03-04 MED ORDER — TRAMADOL HCL 50 MG PO TABS: 50.0000 mg | ORAL_TABLET | Freq: Four times a day (QID) | ORAL | 0 refills | Status: DC | PRN

## 2021-03-04 NOTE — Progress Notes (Signed)
Hematology and Oncology Follow Up Visit  Leslie Knapp [id number] 08/28/1950 69 y.o. 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  She is having a tough problems with her back and hip.  She is going need to have hip surgery.  Unfortunate, there is issues with respect to her seeing her doctor.  He apparently had has left the practice that he was with.  He is going to set up his new practice.  I am not sure when he is got be able to see her.  It sounds like it might be couple months..  She had an MRI of the back and hip a week or so ago.  She has severe spinal stenosis at L3-4.  She has bilateral hip osteoarthritis.  She is taking a lot of ibuprofen.  I really think this is not a good idea.  Unfortunately, she has no other doctor who can help with her pain.  I will see about putting her on Ultram.  I think this to be a lot safer for her.  She is also worried about having hip surgery and bleeding.  Apparently her twin sister had hip surgery blood.  I told her that this is very unusual for this to happen.  She says she has a hard time walking around.  She has a TENS unit because of muscle spasms.  She probably can decrease her Eliquis down to 2.5 mg p.o. twice daily.  I think that given the fact that is been 2 years now that she has been on blood thinner, we can decrease the dose.  She has had no cough or shortness of breath.  There is been no chest wall pain.  She has had no nausea or vomiting.  There is been no melena or bright red blood per rectum.  Overall, I would say performance status is ECOG 2.    Medications:  Allergies as of 03/04/2021       Reactions   Celebrex [celecoxib] Swelling, Rash   Lactose Intolerance  (gi) Other (See Comments)        Medication List        Accurate as of March 04, 2021  8:53 AM. If you have any questions, ask your nurse or doctor.          STOP taking these medications    ibuprofen 400 MG tablet Commonly known as: ADVIL Stopped by: Volanda Napoleon, MD       TAKE these medications    apixaban 2.5 MG Tabs tablet Commonly known as: ELIQUIS Take 1 tablet (2.5 mg total) by mouth 2 (two) times daily. What changed:  medication strength how much to take Changed by: Volanda Napoleon, MD   Enzyme Digest Caps Take by mouth as needed.   MINERALS PO Take 1 oz by mouth daily.   Natural C/Rose Hips 1000 MG tablet Generic drug: ascorbic acid Take 2,000 mg by mouth daily.   NON FORMULARY ROLFING: Deep Tissue Massage, 2 Txs weekly as needed for joint pain   traMADol 50 MG tablet Commonly known as: ULTRAM Take 1 tablet (50 mg total) by mouth every 6 (six) hours as needed. Started by: Volanda Napoleon, MD   Vitamin  D-3 25 MCG (1000 UT) Caps Take 1 each by mouth daily. Liquid d 3 5 drops per day        Allergies:  Allergies  Allergen Reactions   Celebrex [Celecoxib] Swelling and Rash   Lactose Intolerance (Gi) Other (See Comments)    Past Medical History, Surgical history, Social history, and Family History were reviewed and updated.  Review of Systems: Review of Systems  Constitutional: Negative.   HENT: Negative.    Eyes: Negative.   Respiratory: Negative.    Cardiovascular: Negative.   Gastrointestinal: Negative.   Genitourinary: Negative.   Musculoskeletal:  Positive for joint pain.  Skin: Negative.   Neurological: Negative.   Endo/Heme/Allergies: Negative.   Psychiatric/Behavioral: Negative.      Physical Exam:  height is '5\' 10"'$  (1.778 m) and weight is 310 lb 1.9 oz (140.7 kg) (abnormal). Her oral temperature is 98.8 F (37.1 C). Her blood pressure is 106/81 and her pulse is 78. Her oxygen saturation is 97%.   Wt Readings from  Last 3 Encounters:  03/04/21 (!) 310 lb 1.9 oz (140.7 kg)  01/25/21 (!) 316 lb 3.2 oz (143.4 kg)  08/23/20 (!) 310 lb (140.6 kg)    Physical Exam Vitals reviewed.  HENT:     Head: Normocephalic and atraumatic.  Eyes:     Pupils: Pupils are equal, round, and reactive to light.  Cardiovascular:     Rate and Rhythm: Normal rate and regular rhythm.     Heart sounds: Normal heart sounds.  Pulmonary:     Effort: Pulmonary effort is normal.     Breath sounds: Normal breath sounds.  Abdominal:     General: Bowel sounds are normal.     Palpations: Abdomen is soft.  Musculoskeletal:        General: No tenderness or deformity. Normal range of motion.     Cervical back: Normal range of motion.  Lymphadenopathy:     Cervical: No cervical adenopathy.  Skin:    General: Skin is warm and dry.     Findings: No erythema or rash.  Neurological:     Mental Status: She is alert and oriented to person, place, and time.  Psychiatric:        Behavior: Behavior normal.        Thought Content: Thought content normal.        Judgment: Judgment normal.     Lab Results  Component Value Date   WBC 6.0 03/04/2021   HGB 15.4 (H) 03/04/2021   HCT 45.7 03/04/2021   MCV 89.6 03/04/2021   PLT 199 03/04/2021   Lab Results  Component Value Date   FERRITIN 129 08/23/2020   IRON 63 08/23/2020   TIBC 383 08/23/2020   UIBC 319 08/23/2020   IRONPCTSAT 16 (L) 08/23/2020   Lab Results  Component Value Date   RBC 5.10 03/04/2021   No results found for: KPAFRELGTCHN, LAMBDASER, KAPLAMBRATIO No results found for: IGGSERUM, IGA, IGMSERUM No results found for: Odetta Pink, SPEI   Chemistry      Component Value Date/Time   NA 139 03/04/2021 0806   NA 140 09/24/2019 1934   K 4.4 03/04/2021 0806   CL 103 03/04/2021 0806   CO2 29 03/04/2021 0806   BUN 20 03/04/2021 0806   BUN 15 09/24/2019 1934   CREATININE 0.75 03/04/2021 0806   GLU 87  05/21/2018 0000      Component Value Date/Time   CALCIUM 9.9 03/04/2021 0806  ALKPHOS 77 03/04/2021 0806   AST 19 03/04/2021 0806   ALT 13 03/04/2021 0806   BILITOT 0.6 03/04/2021 0806       Impression and Plan: Leslie Knapp is a very pleasant 70 yo caucasian female with history of bilateral saddle pulmonary emboli with right heart strain as well as a right brachial artery thrombus.  Her initial lab work up did show both a protein C and a protein S deficiency at the time.  However, we have repeated these and everything is normal now.  I still believe that she is going to need lifelong Eliquis.  She is not mobile.  She is quite obese.  I just think that if we got her off anticoagulation, she would have another thromboembolic event.  I do think we can decrease her Eliquis to 2.5 mg p.o. twice daily.  I told her to let me know when she is going to have surgery.  When she knows when she is going to have surgery, we will get her in about a week or so before hand.  Again I just feel bad that she is having such a tough time.    Volanda Napoleon, MD 8/26/20228:53 AM

## 2021-03-07 ENCOUNTER — Telehealth: Payer: Self-pay | Admitting: *Deleted

## 2021-03-07 NOTE — Telephone Encounter (Signed)
No 03/04/21 LOS

## 2021-03-08 ENCOUNTER — Telehealth: Payer: Self-pay

## 2021-03-08 NOTE — Telephone Encounter (Signed)
No 03/04/21 LOS noted but chart note states "I told her to let me know when she is going to have surgery.  When she knows when she is going to have surgery, we will get her in about a week or so before hand."   Webb Silversmith 03/08/21

## 2021-03-10 DIAGNOSIS — M9903 Segmental and somatic dysfunction of lumbar region: Secondary | ICD-10-CM | POA: Diagnosis not present

## 2021-03-10 DIAGNOSIS — M546 Pain in thoracic spine: Secondary | ICD-10-CM | POA: Diagnosis not present

## 2021-03-10 DIAGNOSIS — M9901 Segmental and somatic dysfunction of cervical region: Secondary | ICD-10-CM | POA: Diagnosis not present

## 2021-03-10 DIAGNOSIS — M542 Cervicalgia: Secondary | ICD-10-CM | POA: Diagnosis not present

## 2021-03-10 DIAGNOSIS — M62838 Other muscle spasm: Secondary | ICD-10-CM | POA: Diagnosis not present

## 2021-03-10 DIAGNOSIS — M9902 Segmental and somatic dysfunction of thoracic region: Secondary | ICD-10-CM | POA: Diagnosis not present

## 2021-03-10 DIAGNOSIS — M5417 Radiculopathy, lumbosacral region: Secondary | ICD-10-CM | POA: Diagnosis not present

## 2021-03-17 ENCOUNTER — Telehealth (INDEPENDENT_AMBULATORY_CARE_PROVIDER_SITE_OTHER): Payer: Self-pay | Admitting: Otolaryngology

## 2021-03-17 ENCOUNTER — Encounter (HOSPITAL_COMMUNITY): Payer: Self-pay

## 2021-03-17 NOTE — Telephone Encounter (Signed)
Called patient concerning her recent fine-needle aspirate of a right lower thyroid nodule that measured 2.7 cm in size and was Bethesda category 3.  This was sent for Afirma molecular testing and findings were consistent with benign lesion risk of malignancy 4%. I reviewed this with the patient in the office today. At this point would not recommend any surgical intervention unless this enlarges and causes any symptoms as molecular testing was consistent with a benign lesion.

## 2021-03-21 ENCOUNTER — Encounter: Payer: Self-pay | Admitting: Obstetrics & Gynecology

## 2021-03-21 ENCOUNTER — Telehealth (INDEPENDENT_AMBULATORY_CARE_PROVIDER_SITE_OTHER): Payer: Medicare Other | Admitting: Obstetrics & Gynecology

## 2021-03-21 DIAGNOSIS — N950 Postmenopausal bleeding: Secondary | ICD-10-CM

## 2021-03-21 NOTE — Progress Notes (Signed)
TELEHEALTH GYNECOLOGY VISIT ENCOUNTER NOTE  Provider location: Center for [facility]   Patient location: Home  I connected with Leslie Knapp on 03/21/21 at  8:35 AM EDT by telephone and verified that I am speaking with the correct person using two identifiers. Patient was unable to do MyChart audiovisual encounter due to technical difficulties, she tried several times.    I discussed the limitations, risks, security and privacy concerns of performing an evaluation and management service by telephone and the availability of in person appointments. I also discussed with the patient that there may be a patient responsible charge related to this service. The patient expressed understanding and agreed to proceed.   History:  Leslie Knapp is a 70 y.o. G0P0000 female being evaluated today for vaginal discharge. She also reports that she has had no bleeding. NO GYN complaints at this time. Pt reports pain in her right hip. She is about to have a right hip replacement. She denies any abnormal vaginal discharge, bleeding, pelvic pain or other concerns.       Past Medical History:  Diagnosis Date   Acute saddle pulmonary embolism ([place]) [id number]   Complication of anesthesia    wants neck supported during surgery due to hx of migraine caused by concussion   Concussion 2010, 2000 and 1998   periodiotic migraine and occ dizzines   COVID-19 09/2019   sx covid pneumonia,fever, chills body aches, took monoclonal antibody tx done symptoms resolved after 6 weeks   CTS (carpal tunnel syndrome)    Fatty liver disease, nonalcoholic    In [facility] liver biopsy 2010 did not show fatty liver   History of blood transfusion 1975   after knee surgery   IBS (irritable bowel syndrome)    C/D   Localized swelling of both lower legs    go down with propping up of legs   Morbid obesity (HCC)    Osteoarthritis    oa   Pneumonia 09/2019   PONV (postoperative nausea and  vomiting)    watch neck position needs support or gets n/v   Thyroid nodule    sees ent dr Radene Journey for q year   Uterine polyp    Past Surgical History:  Procedure Laterality Date   CARDIAC CATHETERIZATION  03/21/2019   with pfo closure   HYSTEROSCOPY WITH D & C N/A 04/06/2020   Procedure: DILATATION AND CURETTAGE /HYSTEROSCOPY;  Surgeon: Lavonia Drafts, MD;  Location: [place];  Service: Gynecology;  Laterality: N/A;   IVC FILTER INSERTION  07/07/2019   KNEE SURGERY  1975   Left Patella Tendon Replacement   LIVER BIOPSY  2010   PATENT FORAMEN OVALE(PFO) CLOSURE  03/21/2019   TONSILLECTOMY  1963   TOTAL KNEE ARTHROPLASTY  06.2005   Left   TOTAL KNEE ARTHROPLASTY  11.2005   Right   UTERINE POLYP REMOVAL  2010   The following portions of the patient's history were reviewed and updated as appropriate: allergies, current medications, past family history, past medical history, past social history, past surgical history and problem list.   Health Maintenance:  Normal mammogram on 11/16/2020.   Review of Systems:  Pertinent items noted in HPI and remainder of comprehensive ROS otherwise negative.  Physical Exam:   General:  Alert, oriented and cooperative.   Mental Status: Normal mood and affect perceived. Normal judgment and thought content.  Physical exam deferred due to nature of the encounter  Labs and Imaging No results found  for this or any previous visit (from the past 336 hour(s)). MR Lumbar Spine w/o contrast  Result Date: 02/26/2021 CLINICAL DATA:  Low back pain EXAM: MRI LUMBAR SPINE WITHOUT CONTRAST TECHNIQUE: Multiplanar, multisequence MR imaging of the lumbar spine was performed. No intravenous contrast was administered. COMPARISON:  None. FINDINGS: Segmentation:  Standard. Alignment:  Physiologic. Vertebrae:  Superior endplate Schmorl's node at L3. Conus medullaris and cauda equina: Conus extends to the L1 level. Conus and cauda equina  appear normal. Paraspinal and other soft tissues: Negative Disc levels: L1-L2: Normal disc space and facet joints. No spinal canal stenosis. No neural foraminal stenosis. L2-L3: Moderate facet hypertrophy and small disc bulge. Mild spinal canal stenosis. No neural foraminal stenosis. L3-L4: Moderate facet hypertrophy with intermediate sized disc bulge. Severe spinal canal stenosis. No neural foraminal stenosis. L4-L5: Severe facet hypertrophy with small central disc protrusion. No spinal canal stenosis. No neural foraminal stenosis. L5-S1: Severe facet hypertrophy. Small annular fissure at the central aspect of disc. No spinal canal stenosis. No neural foraminal stenosis. Visualized sacrum: Normal. IMPRESSION: 1. Severe spinal canal stenosis at L3-L4 due to combination of disc bulge and facet arthrosis. 2. Mild spinal canal stenosis at L2-L3. 3. Multilevel moderate to severe facet arthrosis, which may be a source of local low back pain. Electronically Signed   By: Ulyses Jarred M.D.   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 TECHNIQUE: Informed written consent was obtained from the patient after a discussion of the risks, benefits and alternatives to treatment. Questions regarding the procedure were encouraged and answered. A timeout was performed prior to the initiation of the procedure. Pre-procedural ultrasound scanning demonstrated unchanged size and appearance of the indeterminate nodule within the right thyroid The procedure was planned. The neck was prepped in the usual sterile fashion, and a sterile drape was applied covering  the operative field. A timeout was performed prior to the initiation of the procedure. Local anesthesia was provided with 1% lidocaine. Under direct ultrasound guidance, 5 FNA biopsies were performed of the right inferior thyroid nodule with a 27 gauge needle. 2 of these samples were obtained for [facility] Multiple ultrasound images were saved for procedural documentation purposes. The samples were prepared and submitted to pathology. Limited post procedural scanning was negative for hematoma or additional complication. Dressings were placed. The patient tolerated the above procedures procedure well without immediate postprocedural complication. FINDINGS: Nodule reference number based on prior diagnostic ultrasound: 2 Maximum size: 2.7 cm Location: Right; Inferior ACR TI-RADS risk category: TR3 (3 points) Reason for biopsy: meets ACR TI-RADS criteria Ultrasound imaging confirms appropriate placement of the needles within the thyroid nodule. IMPRESSION: Technically successful ultrasound guided fine needle aspiration of right inferior thyroid nodule Read by Lavonia Drafts [facility] Electronically Signed   By: Markus Daft M.D.   On: 02/23/2021 16:00      Assessment and Plan:     Abnormal discharge/PMPB       I discussed the assessment and treatment plan with the patient. The patient was provided an opportunity to ask questions and all were answered. The patient agreed with the plan and demonstrated an understanding of the instructions.   The patient  was advised to call back or seek an in-person evaluation/go to the ED if the symptoms worsen or if the condition fails to improve as anticipated.  I provided 15 minutes of non-face-to-face time during this encounter including chart review and care coordination.    Lavonia Drafts, MD Center for [facility], [place]

## 2021-03-22 ENCOUNTER — Telehealth: Payer: Self-pay | Admitting: *Deleted

## 2021-03-22 NOTE — Chronic Care Management (AMB) (Signed)
  Chronic Care Management   Outreach Note  03/22/2021 Name: Leslie Knapp MRN: [id number] DOB: 17-Jul-1950  Leslie Knapp is a 70 y.o. year old female who is a primary care patient of Maximiano Coss, NP. I reached out to Leslie Knapp by phone today in response to a referral sent by Ms. Alexander Mt PCP, Maximiano Coss, NP      An unsuccessful telephone outreach was attempted today. The patient was referred to the case management team for assistance with care management and care coordination.   Follow Up Plan: A HIPAA compliant phone message was left for the patient providing contact information and requesting a return call. The care management team will reach out to the patient again over the next 7 days.  If patient returns call to provider office, please advise to call [place] at [phone].  [facility]  Direct Dial: [phone]

## 2021-03-24 DIAGNOSIS — M546 Pain in thoracic spine: Secondary | ICD-10-CM | POA: Diagnosis not present

## 2021-03-24 DIAGNOSIS — M62838 Other muscle spasm: Secondary | ICD-10-CM | POA: Diagnosis not present

## 2021-03-24 DIAGNOSIS — M9901 Segmental and somatic dysfunction of cervical region: Secondary | ICD-10-CM | POA: Diagnosis not present

## 2021-03-24 DIAGNOSIS — M9903 Segmental and somatic dysfunction of lumbar region: Secondary | ICD-10-CM | POA: Diagnosis not present

## 2021-03-24 DIAGNOSIS — M542 Cervicalgia: Secondary | ICD-10-CM | POA: Diagnosis not present

## 2021-03-24 DIAGNOSIS — M9902 Segmental and somatic dysfunction of thoracic region: Secondary | ICD-10-CM | POA: Diagnosis not present

## 2021-03-24 DIAGNOSIS — M5417 Radiculopathy, lumbosacral region: Secondary | ICD-10-CM | POA: Diagnosis not present

## 2021-03-29 NOTE — Chronic Care Management (AMB) (Signed)
  Chronic Care Management   Note  03/29/2021 Name: Leslie Knapp MRN: [id number] DOB: 02-16-1951  Leslie Knapp is a 70 y.o. year old female who is a primary care patient of Maximiano Coss, NP. I reached out to [name] by phone today in response to a referral sent by Ms. Alexander Mt PCP, Maximiano Coss, NP.      Ms. Detwiler was given information about Chronic Care Management services today including:  CCM service includes personalized support from designated clinical staff supervised by her physician, including individualized plan of care and coordination with other care providers 24/7 contact phone numbers for assistance for urgent and routine care needs. Service will only be billed when office clinical staff spend 20 minutes or more in a month to coordinate care. Only one practitioner may furnish and bill the service in a calendar month. The patient may stop CCM services at any time (effective at the end of the month) by phone call to the office staff. The patient will be responsible for cost sharing (co-pay) of up to 20% of the service fee (after annual deductible is met).  Patient agreed to services and verbal consent obtained.   Follow up plan: Telephone appointment with care management team member scheduled for:04/20/2021  [place] [facility]  Direct Dial: [phone]

## 2021-04-07 DIAGNOSIS — M62838 Other muscle spasm: Secondary | ICD-10-CM | POA: Diagnosis not present

## 2021-04-07 DIAGNOSIS — M9901 Segmental and somatic dysfunction of cervical region: Secondary | ICD-10-CM | POA: Diagnosis not present

## 2021-04-07 DIAGNOSIS — M5417 Radiculopathy, lumbosacral region: Secondary | ICD-10-CM | POA: Diagnosis not present

## 2021-04-07 DIAGNOSIS — M9903 Segmental and somatic dysfunction of lumbar region: Secondary | ICD-10-CM | POA: Diagnosis not present

## 2021-04-07 DIAGNOSIS — M9902 Segmental and somatic dysfunction of thoracic region: Secondary | ICD-10-CM | POA: Diagnosis not present

## 2021-04-07 DIAGNOSIS — M546 Pain in thoracic spine: Secondary | ICD-10-CM | POA: Diagnosis not present

## 2021-04-07 DIAGNOSIS — M542 Cervicalgia: Secondary | ICD-10-CM | POA: Diagnosis not present

## 2021-04-13 ENCOUNTER — Telehealth: Payer: Self-pay | Admitting: Family Medicine

## 2021-04-13 NOTE — Telephone Encounter (Signed)
[facility] requesting copy of records. Emailed.

## 2021-04-14 ENCOUNTER — Other Ambulatory Visit: Payer: Self-pay | Admitting: Family Medicine

## 2021-04-14 DIAGNOSIS — E041 Nontoxic single thyroid nodule: Secondary | ICD-10-CM

## 2021-04-20 ENCOUNTER — Ambulatory Visit (INDEPENDENT_AMBULATORY_CARE_PROVIDER_SITE_OTHER): Payer: Medicare Other

## 2021-04-20 DIAGNOSIS — M799 Soft tissue disorder, unspecified: Secondary | ICD-10-CM

## 2021-04-20 DIAGNOSIS — E7800 Pure hypercholesterolemia, unspecified: Secondary | ICD-10-CM

## 2021-04-20 NOTE — Chronic Care Management (AMB) (Signed)
Chronic Care Management   CCM RN Visit Note  04/20/2021 Name: Leslie Knapp MRN: [id number] DOB: 1950/08/08  Subjective: Leslie Knapp is a 70 y.o. year old female who is a primary care patient of Maximiano Coss, NP. The care management team was consulted for assistance with disease management and care coordination needs.    Engaged with patient by telephone for initial visit in response to provider referral for case management and/or care coordination services.   Consent to Services:  The patient was given the following information about Chronic Care Management services today, agreed to services, and gave verbal consent: 1. CCM service includes personalized support from designated clinical staff supervised by the primary care provider, including individualized plan of care and coordination with other care providers 2. 24/7 contact phone numbers for assistance for urgent and routine care needs. 3. Service will only be billed when office clinical staff spend 20 minutes or more in a month to coordinate care. 4. Only one practitioner may furnish and bill the service in a calendar month. 5.The patient may stop CCM services at any time (effective at the end of the month) by phone call to the office staff. 6. The patient will be responsible for cost sharing (co-pay) of up to 20% of the service fee (after annual deductible is met). Patient agreed to services and consent obtained.  Patient agreed to services and verbal consent obtained.   Assessment: Review of patient past medical history, allergies, medications, health status, including review of consultants reports, laboratory and other test data, was performed as part of comprehensive evaluation and provision of chronic care management services.   SDOH (Social Determinants of Health) assessments and interventions performed:  SDOH Interventions    Flowsheet Row Most Recent Value  SDOH Interventions   Food Insecurity Interventions Intervention  Not Indicated  Financial Strain Interventions Intervention Not Indicated  Housing Interventions Intervention Not Indicated  Stress Interventions Intervention Not Indicated  Transportation Interventions Intervention Not Indicated        CCM Care Plan  Allergies  Allergen Reactions   Celebrex [Celecoxib] Swelling and Rash   Lactose Intolerance (Gi) Other (See Comments)    Outpatient Encounter Medications as of 04/20/2021  Medication Sig   apixaban (ELIQUIS) 2.5 MG TABS tablet Take 1 tablet (2.5 mg total) by mouth 2 (two) times daily.   Cholecalciferol (VITAMIN D-3) 1000 UNITS CAPS Take 1 each by mouth daily. Liquid d 3 5 drops per day   Digestive Enzymes (ENZYME DIGEST) CAPS Take by mouth as needed.    NATURAL C/ROSE HIPS 1000 MG tablet Take 2,000 mg by mouth daily.   NON FORMULARY ROLFING: Deep Tissue Massage, 2 Txs weekly as needed for joint pain   Trace Min [id number] (MINERALS PO) Take 1 oz by mouth daily.   traMADol (ULTRAM) 50 MG tablet Take 1 tablet (50 mg total) by mouth every 6 (six) hours as needed. (Patient not taking: Reported on 03/21/2021)   No facility-administered encounter medications on file as of 04/20/2021.    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   Abdominal pain 03/08/2019   Pure hypercholesterolemia 09/25/2017   Morbid obesity (  [place]) 06/07/2017   Encounter for Medicare annual wellness exam 06/07/2017   Vitamin D deficiency 06/07/2017   IBS (irritable bowel syndrome) 08/29/2016   Migraine 08/29/2016   Disorder of musculoskeletal system 03/23/2015   Eye exam  abnormal 03/23/2015   NASH (nonalcoholic steatohepatitis) 10/19/2011    Conditions to be addressed/monitored:HLD and chronic pain, arthritis, obesity  Care Plan : Chronic pain  Updates made by Dimitri Ped, RN since 04/20/2021 12:00 AM     Problem: Limited  Mobility and Function (Osteoarthritis)   Priority: High     Long-Range Goal: Maintain Mobility and Function   Start Date: 04/20/2021  Expected End Date: 10/08/2021  This Visit's Progress: On track  Priority: High  Note:   Current Barriers:  Knowledge Deficits related to self-health management of acute or chronic pain rt hip and back due to arthritis Chronic Disease Management support and education needs related to chronic pain Knowledge Deficits related to self management of chronic pain rt hip and back Chronic Disease Management support and education needs related to self management of chronic pain rt hip and back Unable to independently self management of chronic pain rt hip and back Unable to perform IADLs independently States she started having pain in her rt hip and back in July.  States she mostly laying down or sitting in chair,  States she has a lot of pain when she gets up.  States she saw Dr. Junius Roads and she needs to have surgery on her hip.  States she has an appt with Dr. Ninfa Linden on 05/04/21 to see if she can have surgery.  States she does not like to take the tramadol for pain.  States she uses magnets at night which help with her pain some.  States she has a massage therapist come to her home once a week and this help with her pain and muscle tightness. Clinical Goal(s):  patient will verbalize understanding of plan for pain management. , patient will attend all scheduled medical appointments: Dr. Ninfa Linden 05/04/21, patient will demonstrate use of different relaxation  skills and/or diversional activities to assist with pain reduction (distraction, imagery, relaxation, massage, acupressure, TENS, heat, and cold  application., patient will report pain at a level less than 3 to 4 on a 10-10 rating scale., patient will use pharmacological and nonpharmacological pain relief strategies as prescribed. , patient will verbalize acceptable level of pain relief and ability to engage in desired activities, and patient will engage in desired activities without an increase in pain level Interventions:  Collaboration with Maximiano Coss, NP regarding development and update of comprehensive plan of care as evidenced by provider attestation and co-signature Pain assessment performed Medications reviewed Discussed plans with patient for ongoing care management follow up and provided patient with direct contact information for care management team Evaluation of current treatment plan related to self management of chronic pain rt hip and back and patient's adherence to plan as established by provider. Provided education to patient re: self management of chronic pain rt hip and back Provided patient with written educational materials related to self management of chronic pain rt hip and back Reviewed scheduled/upcoming provider appointments including:  Discussed plans with patient for ongoing care management follow up and provided patient with direct contact information for care management team Patient Goals/Self Care Activities:  Will self-administer medications as prescribed Will attend all scheduled provider appointments Will call pharmacy for medication refills 7 days prior to needed refill date Patient will calls provider office for new concerns or  questions - learn how to meditate - learn relaxation techniques - practice acceptance of chronic pain - practice relaxation or meditation daily - tell myself I can (not I can't) - think of new ways to do favorite things - use distraction techniques - use relaxation during pain Follow Up Plan: Telephone follow up appointment with care management team member scheduled  for: 05/25/21 at 9 AM The patient has been provided with contact information for the care management team and has been advised to call with any health related questions or concerns.       Care Plan : Obesity (Adult)  Updates made by Dimitri Ped, RN since 04/20/2021 12:00 AM     Problem: Weight Management (Obesity)   Priority: Medium     Long-Range Goal: Weight Loss Achieved   Start Date: 04/20/2021  Expected End Date: 10/08/2021  This Visit's Progress: On track  Priority: Medium  Note:   Current Barriers:  Ineffective Self Health Maintenance in a patient with  obesity with HLD Unable to independently self manage obesity Unable to perform IADLs independently States she has been working with a dietitian to lose weight.  States she has lost 19 lbs since August.  States she is following a low CHO diet with mostly protein and vegetables and fruit.  STAtes she want to get her BMI to 40 or below so she can have her hip surgery.  States she has not been able to exercise due to her hip and back pain Clinical Goal(s):  Collaboration with Maximiano Coss, NP regarding development and update of comprehensive plan of care as evidenced by provider attestation and co-signature Inter-disciplinary care team collaboration (see longitudinal plan of care) patient will work with care management team to address care coordination and chronic disease management needs related to Disease Management Educational Needs Care Coordination   Interventions:  Evaluation of current treatment plan related to  self management of obesity , Inability to perform IADL's independently self-management and patient's adherence to plan as established by provider. Collaboration with Maximiano Coss, NP regarding development and update of comprehensive plan of care as evidenced by provider attestation       and co-signature Inter-disciplinary care team collaboration (see longitudinal plan of care) Discussed plans with  patient for ongoing care management follow up and provided patient with direct contact information for care management team Reviewed to continue to follow a low CHO diet Reviewed to avoid saturated fats, trans-fats and eat more fiber Reviewed to lower fatty foods, red meat, cheese, milk and increase fiber like whole grains and vegetables. Self Care Activities:  Self administers medications as prescribed Attends all scheduled provider appointments Performs IADL's independently Calls provider office for new concerns or questions Patient Goals: - drink 6 to 8 glasses of water each day - eat 3 to 5 servings of fruits and vegetables each day - eat fish at least once per week - fill half the plate with nonstarchy vegetables - keep a food diary - manage portion size - read food labels for fat, fiber, carbohydrates and portion size - reduce red meat to 2 to 3 times a week - set a realistic goal - set goal weight - switch to low-fat or skim milk - switch to sugar-free drinks - Follow Up Plan: Telephone follow up appointment with care management team member scheduled for: 05/25/21 at 9 AM The patient has been provided with contact information for the care management team and has been advised to call with any health related questions  or concerns.       Plan:Telephone follow up appointment with care management team member scheduled for:  05/25/21 and The patient has been provided with contact information for the care management team and has been advised to call with any health related questions or concerns.  Peter Garter RN, BSN,CCM, CDE Care Management Coordinator [place] [phone], Mobile [phone]

## 2021-04-20 NOTE — Patient Instructions (Signed)
Visit Information   PATIENT GOALS:   Goals Addressed             This Visit's Progress    RNCM:Achieve a Healthy Weight-Obesity       Timeframe:  Long-Range Goal Priority:  Medium Start Date:       04/20/21                      Expected End Date:  10/08/21                     Follow Up Date 05/25/21    - drink 6 to 8 glasses of water each day - eat 3 to 5 servings of fruits and vegetables each day - eat fish at least once per week - fill half the plate with nonstarchy vegetables - keep a food diary - manage portion size - read food labels for fat, fiber, carbohydrates and portion size - reduce red meat to 2 to 3 times a week - set a realistic goal - set goal weight - switch to low-fat or skim milk - switch to sugar-free drinks    Why is this important?   When you are ready to manage your weight, have a plan and have set a goal, it is time to take action.  Taking small steps to change how you eat and exercise is a good place to start.    Notes:      RNCM:Cope with Pain-Osteoarthritis       Timeframe:  Long-Range Goal Priority:  High Start Date:       04/20/21                      Expected End Date:    10/08/21                   Follow Up Date 05/25/21    - learn how to meditate - learn relaxation techniques - practice acceptance of chronic pain - practice relaxation or meditation daily - tell myself I can (not I can't) - think of new ways to do favorite things - use distraction techniques - use relaxation during pain    Why is this important?   Living with joint pain and enjoying your life may be hard.  Feelings like depression or anger can make your pain worse.  Learning ways to cope may help you find some relief from the pain.    Notes:      RNCM:Eat Healthy       Timeframe:  Long-Range Goal Priority:  Medium Start Date:          04/20/21                   Expected End Date:    10/08/21                   Follow Up Date 05/25/21    - set goal weight -  drink 6 to 8 glasses of water each day - eat fish at least once per week - fill half of plate with vegetables - keep a food diary - manage portion size - read food labels for fat, fiber, carbohydrates and portion size - reduce red meat to 2 to 3 times a week - set a realistic goal - switch to low-fat or skim milk - switch to sugar-free drinks    Why is this important?   When you are  ready to manage your nutrition or weight, having a plan and setting goals will help.  Taking small steps to change how you eat and exercise is a good place to start.    Notes:       Chronic Pain, Adult Chronic pain is a type of pain that lasts or keeps coming back for at least 3-6 months. You may have headaches, pain in the abdomen, or pain in other areas of the body. Chronic pain may be related to an illness, such as fibromyalgia or complex regional pain syndrome. Chronic pain may also be related to an injury or a health condition. Sometimes, the cause of chronic pain is not known. Chronic pain can make it hard for you to do daily activities. If not treated, chronic pain can lead to anxiety and depression. Treatment depends on the cause and severity of your pain. You may need to work with a pain specialist to come up with a treatment plan. The plan may include medicine, counseling, and physical therapy. Many people benefit from a combination of two or more types of treatment to control their pain. Follow these instructions at home: Medicines Take over-the-counter and prescription medicines only as told by your health care provider. Ask your health care provider if the medicine prescribed to you: Requires you to avoid driving or using machinery. Can cause constipation. You may need to take these actions to prevent or treat constipation: Drink enough fluid to keep your urine pale yellow. Take over-the-counter or prescription medicines. Eat foods that are high in fiber, such as beans, whole grains, and fresh  fruits and vegetables. Limit foods that are high in fat and processed sugars, such as fried or sweet foods. Treatment plan Follow your treatment plan as told by your health care provider. This may include: Gentle, regular exercise. Eating a healthy diet that includes foods such as vegetables, fruits, fish, and lean meats. Cognitive or behavioral therapy that changes the way you think or act in response to the pain. This may help improve how you feel. Working with a physical therapist. Meditation, yoga, acupuncture, or massage therapy. Aroma, color, light, or sound therapy. Local electrical stimulation. The electrical pulses help to relieve pain by temporarily stopping the nerve impulses that cause you to feel pain. Injections. These deliver numbing or pain-relieving medicines into the spine or the area of pain.  Lifestyle  Ask your health care provider whether you should keep a pain diary. Your health care provider will tell you what information to write in the diary. This may include when you have pain, what the pain feels like, and how medicines and other behaviors or treatments help to reduce the pain. Consider talking with a mental health care provider about how to manage chronic pain. Consider joining a chronic pain support group. Try to control or lower your stress levels. Talk with your health care provider about ways to do this. General instructions Learn as much as you can about how to manage your chronic pain. Ask your health care provider if an intensive pain rehabilitation program or a chronic pain specialist would be helpful. Check your pain level as told by your health care provider. Ask your health care provider if you should use a pain scale. It is up to you to get the results of any tests that were done. Ask your health care provider, or the department that is doing the tests, when your results will be ready. Keep all follow-up visits as told by your health care  provider. This  is important. Contact a health care provider if: Your pain gets worse, or you have new pain. You have trouble sleeping. You have trouble doing your normal activities. Your pain is not controlled with treatment. You have side effects from pain medicine. You feel weak. You notice any other changes that show that your condition is getting worse. Get help right away if: You lose feeling or have numbness in your body. You lose control of bowel or bladder function. Your pain suddenly gets much worse. You develop shaking or chills. You develop confusion. You develop chest pain. You have trouble breathing or shortness of breath. You pass out. You have thoughts about hurting yourself or others. If you ever feel like you may hurt yourself or others, or have thoughts about taking your own life, get help right away. Go to your nearest emergency department or: Call your local emergency services (911 in the [place] Call a suicide crisis helpline, such as the [place] at 318 320 1674. This is open 24 hours a day in the [place] Text the Crisis Text Line at (707)137-3150 (in the [place] Summary Chronic pain is a type of pain that lasts or keeps coming back for at least 3-6 months. Chronic pain may be related to an illness, injury, or other health condition. Sometimes, the cause of chronic pain is not known. Treatment depends on the cause and severity of your pain. Many people benefit from a combination of two or more types of treatment to control their pain. Follow your treatment plan as told by your health care provider. This information is not intended to replace advice given to you by your health care provider. Make sure you discuss any questions you have with your health care provider. Document Revised: 03/13/2019 Document Reviewed: 03/13/2019 Elsevier Patient Education  [place]. High Cholesterol High cholesterol is a condition in which the blood has high levels of a  white, waxy substance similar to fat (cholesterol). The liver makes all the cholesterol that the body needs. The human body needs small amounts of cholesterol to help build cells. A person gets extra or excess cholesterol from the food that he or she eats. The blood carries cholesterol from the liver to the rest of the body. If you have high cholesterol, deposits (plaques) may build up on the walls of your arteries. Arteries are the blood vessels that carry blood away from your heart. These plaques make the arteries narrow and stiff. Cholesterol plaques increase your risk for heart attack and stroke. Work with your health care provider to keep your cholesterol levels in a healthy range. What increases the risk? The following factors may make you more likely to develop this condition: Eating foods that are high in animal fat (saturated fat) or cholesterol. Being overweight. Not getting enough exercise. A family history of high cholesterol (familial hypercholesterolemia). Use of tobacco products. Having diabetes. What are the signs or symptoms? In most cases, high cholesterol does not usually cause any symptoms. In severe cases, very high cholesterol levels can cause: Fatty bumps under the skin (xanthomas). A white or gray ring around the black center (pupil) of the eye. How is this diagnosed? This condition may be diagnosed based on the results of a blood test. If you are older than 69 years of age, your health care provider may check your cholesterol levels every 4-6 years. You may be checked more often if you have high cholesterol or other risk factors for heart disease. The blood  test for cholesterol measures: "Bad" cholesterol, or LDL cholesterol. This is the main type of cholesterol that causes heart disease. The desired level is less than 100 mg/dL (2.59 mmol/L). "Good" cholesterol, or HDL cholesterol. HDL helps protect against heart disease by cleaning the arteries and carrying the LDL to  the liver for processing. The desired level for HDL is 60 mg/dL (1.55 mmol/L) or higher. Triglycerides. These are fats that your body can store or burn for energy. The desired level is less than 150 mg/dL (1.69 mmol/L). Total cholesterol. This measures the total amount of cholesterol in your blood and includes LDL, HDL, and triglycerides. The desired level is less than 200 mg/dL (5.17 mmol/L). How is this treated? Treatment for high cholesterol starts with lifestyle changes, such as diet and exercise. Diet changes. You may be asked to eat foods that have more fiber and less saturated fats or added sugar. Lifestyle changes. These may include regular exercise, maintaining a healthy weight, and quitting use of tobacco products. Medicines. These are given when diet and lifestyle changes have not worked. You may be prescribed a statin medicine to help lower your cholesterol levels. Follow these instructions at home: Eating and drinking  Eat a healthy, balanced diet. This diet includes: Daily servings of a variety of fresh, frozen, or canned fruits and vegetables. Daily servings of whole grain foods that are rich in fiber. Foods that are low in saturated fats and trans fats. These include poultry and fish without skin, lean cuts of meat, and low-fat dairy products. A variety of fish, especially oily fish that contain omega-3 fatty acids. Aim to eat fish at least 2 times a week. Avoid foods and drinks that have added sugar. Use healthy cooking methods, such as roasting, grilling, broiling, baking, poaching, steaming, and stir-frying. Do not fry your food except for stir-frying. If you drink alcohol: Limit how much you have to: 0-1 drink a day for women who are not pregnant. 0-2 drinks a day for men. Know how much alcohol is in a drink. In the [place] one drink equals one 12 oz bottle of beer (355 mL), one 5 oz glass of wine (148 mL), or one 1 oz glass of hard liquor (44 mL). Lifestyle  Get regular  exercise. Aim to exercise for a total of 150 minutes a week. Increase your activity level by doing activities such as gardening, walking, and taking the stairs. Do not use any products that contain nicotine or tobacco. These products include cigarettes, chewing tobacco, and vaping devices, such as e-cigarettes. If you need help quitting, ask your health care provider. General instructions Take over-the-counter and prescription medicines only as told by your health care provider. Keep all follow-up visits. This is important. Where to find more information American Heart Association: www.heart.org National Heart, Lung, and Blood Institute: https://wilson-eaton.com/ Contact a health care provider if: You have trouble achieving or maintaining a healthy diet or weight. You are starting an exercise program. You are unable to stop smoking. Get help right away if: You have chest pain. You have trouble breathing. You have discomfort or pain in your jaw, neck, back, shoulder, or arm. You have any symptoms of a stroke. "BE FAST" is an easy way to remember the main warning signs of a stroke: B - Balance. Signs are dizziness, sudden trouble walking, or loss of balance. E - Eyes. Signs are trouble seeing or a sudden change in vision. F - Face. Signs are sudden weakness or numbness of the face, or the  face or eyelid drooping on one side. A - Arms. Signs are weakness or numbness in an arm. This happens suddenly and usually on one side of the body. S - Speech. Signs are sudden trouble speaking, slurred speech, or trouble understanding what people say. T - Time. Time to call emergency services. Write down what time symptoms started. You have other signs of a stroke, such as: A sudden, severe headache with no known cause. Nausea or vomiting. Seizure. These symptoms may represent a serious problem that is an emergency. Do not wait to see if the symptoms will go away. Get medical help right away. Call your local  emergency services (911 in the [place] Do not drive yourself to the hospital. Summary Cholesterol plaques increase your risk for heart attack and stroke. Work with your health care provider to keep your cholesterol levels in a healthy range. Eat a healthy, balanced diet, get regular exercise, and maintain a healthy weight. Do not use any products that contain nicotine or tobacco. These products include cigarettes, chewing tobacco, and vaping devices, such as e-cigarettes. Get help right away if you have any symptoms of a stroke. This information is not intended to replace advice given to you by your health care provider. Make sure you discuss any questions you have with your health care provider. Document Revised: 09/09/2020 Document Reviewed: 08/30/2020 Elsevier Patient Education  2022 [employer].   Consent to CCM Services: Ms. Meece was given information about Chronic Care Management services including:  CCM service includes personalized support from designated clinical staff supervised by her physician, including individualized plan of care and coordination with other care providers 24/7 contact phone numbers for assistance for urgent and routine care needs. Service will only be billed when office clinical staff spend 20 minutes or more in a month to coordinate care. Only one practitioner may furnish and bill the service in a calendar month. The patient may stop CCM services at any time (effective at the end of the month) by phone call to the office staff. The patient will be responsible for cost sharing (co-pay) of up to 20% of the service fee (after annual deductible is met).  Patient agreed to services and verbal consent obtained.   Patient verbalizes understanding of instructions provided today and agrees to view in [place].   Telephone follow up appointment with care management team member scheduled for: Peter Garter RN, [facility], CDE Care Management Coordinator [facility] [phone], Mobile [phone]   CLINICAL CARE PLAN: Patient Care Plan: Chronic pain     Problem Identified: Limited  Mobility and Function (Osteoarthritis)   Priority: High     Long-Range Goal: Maintain Mobility and Function   Start Date: 04/20/2021  Expected End Date: 10/08/2021  This Visit's Progress: On track  Priority: High  Note:   Current Barriers:  Knowledge Deficits related to self-health management of acute or chronic pain rt hip and back due to arthritis Chronic Disease Management support and education needs related to chronic pain Knowledge Deficits related to self management of chronic pain rt hip and back Chronic Disease Management support and education needs related to self management of chronic pain rt hip and back Unable to independently self management of chronic pain rt hip and back Unable to perform IADLs independently States she started having pain in her rt hip and back in July.  States she mostly laying down or sitting in chair,  States she has a lot of pain when she gets up.  States  she saw Dr. Junius Roads and she needs to have surgery on her hip.  States she has an appt with Dr. Ninfa Linden on 05/04/21 to see if she can have surgery.  States she does not like to take the tramadol for pain.  States she uses magnets at night which help with her pain some.  States she has a massage therapist come to her home once a week and this help with her pain and muscle tightness. Clinical Goal(s):  patient will verbalize understanding of plan for pain management. , patient will attend all scheduled medical appointments: Dr. Ninfa Linden 05/04/21, patient will demonstrate use of different relaxation  skills and/or diversional activities to assist with pain reduction (distraction, imagery, relaxation, massage, acupressure, TENS, heat, and cold application., patient will report pain at a level less than 3 to 4 on a 10-10 rating scale., patient will use pharmacological  and nonpharmacological pain relief strategies as prescribed. , patient will verbalize acceptable level of pain relief and ability to engage in desired activities, and patient will engage in desired activities without an increase in pain level Interventions:  Collaboration with Maximiano Coss, NP regarding development and update of comprehensive plan of care as evidenced by provider attestation and co-signature Pain assessment performed Medications reviewed Discussed plans with patient for ongoing care management follow up and provided patient with direct contact information for care management team Evaluation of current treatment plan related to self management of chronic pain rt hip and back and patient's adherence to plan as established by provider. Provided education to patient re: self management of chronic pain rt hip and back Provided patient with written educational materials related to self management of chronic pain rt hip and back Reviewed scheduled/upcoming provider appointments including:  Discussed plans with patient for ongoing care management follow up and provided patient with direct contact information for care management team Patient Goals/Self Care Activities:  Will self-administer medications as prescribed Will attend all scheduled provider appointments Will call pharmacy for medication refills 7 days prior to needed refill date Patient will calls provider office for new concerns or questions - learn how to meditate - learn relaxation techniques - practice acceptance of chronic pain - practice relaxation or meditation daily - tell myself I can (not I can't) - think of new ways to do favorite things - use distraction techniques - use relaxation during pain Follow Up Plan: Telephone follow up appointment with care management team member scheduled for: 05/25/21 at 9 AM The patient has been provided with contact information for the care management team and has been advised  to call with any health related questions or concerns.       Patient Care Plan: Obesity (Adult)     Problem Identified: Weight Management (Obesity)   Priority: Medium     Long-Range Goal: Weight Loss Achieved   Start Date: 04/20/2021  Expected End Date: 10/08/2021  This Visit's Progress: On track  Priority: Medium  Note:   Current Barriers:  Ineffective Self Health Maintenance in a patient with  obesity with HLD Unable to independently self manage obesity Unable to perform IADLs independently States she has been working with a dietitian to lose weight.  States she has lost 19 lbs since August.  States she is following a low CHO diet with mostly protein and vegetables and fruit.  STAtes she want to get her BMI to 40 or below so she can have her hip surgery.  States she has not been able to exercise due to her hip  and back pain Clinical Goal(s):  Collaboration with Maximiano Coss, NP regarding development and update of comprehensive plan of care as evidenced by provider attestation and co-signature Inter-disciplinary care team collaboration (see longitudinal plan of care) patient will work with care management team to address care coordination and chronic disease management needs related to Disease Management Educational Needs Care Coordination   Interventions:  Evaluation of current treatment plan related to  self management of obesity , Inability to perform IADL's independently self-management and patient's adherence to plan as established by provider. Collaboration with Maximiano Coss, NP regarding development and update of comprehensive plan of care as evidenced by provider attestation       and co-signature Inter-disciplinary care team collaboration (see longitudinal plan of care) Discussed plans with patient for ongoing care management follow up and provided patient with direct contact information for care management team Reviewed to continue to follow a low CHO diet Reviewed  to avoid saturated fats, trans-fats and eat more fiber Reviewed to lower fatty foods, red meat, cheese, milk and increase fiber like whole grains and vegetables. Self Care Activities:  Self administers medications as prescribed Attends all scheduled provider appointments Performs IADL's independently Calls provider office for new concerns or questions Patient Goals: - drink 6 to 8 glasses of water each day - eat 3 to 5 servings of fruits and vegetables each day - eat fish at least once per week - fill half the plate with nonstarchy vegetables - keep a food diary - manage portion size - read food labels for fat, fiber, carbohydrates and portion size - reduce red meat to 2 to 3 times a week - set a realistic goal - set goal weight - switch to low-fat or skim milk - switch to sugar-free drinks - Follow Up Plan: Telephone follow up appointment with care management team member scheduled for: 05/25/21 at 9 AM The patient has been provided with contact information for the care management team and has been advised to call with any health related questions or concerns.

## 2021-05-01 NOTE — Telephone Encounter (Signed)
This concern has been previously addressed by myself and/or another provider.  If they patient has ongoing concerns, they can contact me at their convenience.  Thank you,  Rich Demarrio Menges, NP 

## 2021-05-04 ENCOUNTER — Telehealth: Payer: Self-pay | Admitting: *Deleted

## 2021-05-04 ENCOUNTER — Ambulatory Visit (INDEPENDENT_AMBULATORY_CARE_PROVIDER_SITE_OTHER): Payer: Medicare Other | Admitting: Orthopaedic Surgery

## 2021-05-04 ENCOUNTER — Encounter: Payer: Self-pay | Admitting: Orthopaedic Surgery

## 2021-05-04 VITALS — Ht 69.0 in | Wt 279.0 lb

## 2021-05-04 DIAGNOSIS — M1611 Unilateral primary osteoarthritis, right hip: Secondary | ICD-10-CM | POA: Diagnosis not present

## 2021-05-04 DIAGNOSIS — I749 Embolism and thrombosis of unspecified artery: Secondary | ICD-10-CM | POA: Diagnosis not present

## 2021-05-04 NOTE — Telephone Encounter (Signed)
Patient is familiar to [facility] because her twin sister is an Ortho bundle patient through [facility]. Patient was seen in office today to discuss Primary osteoarthritis of the right hip and possible right hip total arthroplasty with Dr. Ninfa Linden. She was weighed in office today and her documented height/weight was 5'9" and 179 lbs., which put her BMI at 41.20. Dr. Ninfa Linden felt comfortable proceeding with total hip arthroplasty since her weight has been trending downward. Made aware that in Epic, patient shows up as an Ortho bundle patient through [facility] and this would be outside of the hard limit. Discussed with patient at length that her BMI would have to come down to below 40 for surgery to proceed. She informed her new PCP is Dr. Junius Roads, with [facility], who is not a member of [facility] and she would not like to be considered an Ortho bundle patient at this time. When discussing her weight today, she did note that her weight at today's visit was not 179, but 197, which would put her BMI at 43.9. Discussed with Dr. Ninfa Linden, who would like to see her continue to trend downward before proceeding with hip replacement. Patient scheduled for an in office weight visit on 06/10/21 at 1:00 pm with RNCM to ensure she is trending downward. Decision made to not include in bundle program as she does not wish to participate and her PCP has recently changed to an outside provider. Will continue to help in office as necessary. Updated surgery scheduler as well.

## 2021-05-04 NOTE — Progress Notes (Signed)
Office Visit Note   Patient: Leslie Knapp           Date of Birth: June 07, 1951           MRN: [id number] Visit Date: 05/04/2021              Requested by: Maximiano Coss, NP [address] [place],  [place] [id number] PCP: Maximiano Coss, NP   Assessment & Plan: Visit Diagnoses:  1. Primary osteoarthritis of right hip     Plan: Discussed with patient about her radiographic findings and also discussed surgery with her.  She understands she is at a greater risk for complications given her body habitus and BMI.  She is encouraged to continue to work on losing weight.  Recommend right total hip arthroplasty in the near future.  Questions encouraged and answered at length by Dr. By myself.  Handout on right total hip arthroplasty surgery was given.  Risk benefits of surgery discussed with patient at length.  Risk include but are not limited to DVT/PE, wound healing problems, infection, blood loss, need for blood transfusion, nerve vessel injury and leg length discrepancy.  We will work on setting her up for her surgery in the near future have her back postop 2 weeks.  Follow-Up Instructions: Return for post op.   Orders:  No orders of the defined types were placed in this encounter.  No orders of the defined types were placed in this encounter.     Procedures: No procedures performed   Clinical Data: No additional findings.   Subjective: No chief complaint on file.   HPI [name] is a pleasant 70 year old female comes in today with bilateral hip pain right greater than left.  She has seen Dr. Junius Roads in the past and had an injection in the right hip that gave her good relief for 7 days.  She is someone who has been working on weight loss and has dropped some 30 pounds since July.  She is working with a dietitian to lose weight and reports losing about 2 pounds per week.  Medical history is pertinent for chronic anticoagulation status post saddle pulmonary embolism in  2020.  She sees Dr. Marin Olp to manage this.  She had pulmonary embolism and required an IVC filter placement due to a PFO.She uses a Rollator to ambulate.  She is not diabetic.  She has had bilateral knee replacements and states she had no complications with her surgeries.  She does note that she does not like taking pain medicine and wishes to take low-dose pain medicine as she did after her knee replacements surgeries during the perioperative period. MRI right hip without contrast dated 03/02/2021 was reviewed and shows moderate narrowing in the right hip joint space.  Right hip subchondral edema changes in the femoral head and also a large subchondral cyst in the right anterior acetabular roof.  There is also subchondral marrow edema changes in the right acetabulum. Radiographs of the right hip and AP pelvis shows moderate arthritis in both hips no acute fractures.  Left hip appears slightly more advanced arthritic changes compared to the right.    Review of Systems  Constitutional:  Negative for chills and fever.  Respiratory:  Negative for shortness of breath.   Cardiovascular:  Negative for chest pain.  Musculoskeletal:  Positive for arthralgias.    Objective: Vital Signs: Ht 5\' 9"  (1.753 m)   Wt 279 lb (126.6 kg)   BMI 41.20 kg/m   Physical Exam  Constitutional:      Appearance: She is not ill-appearing or diaphoretic.  Cardiovascular:     Pulses: Normal pulses.  Neurological:     Mental Status: She is alert and oriented to person, place, and time.  Psychiatric:        Mood and Affect: Mood normal.    Ortho Exam Significant guarding both hips with any attempts at range of motion.  Significant decreased internal rotation from pain bilaterally.  Dorsiflexion plantarflexion bilateral ankles intact.  Dorsal pedal pulses are 2+ bilaterally.  Calf supple nontender bilaterally. The patient was laid flat on the exam table by Dr. Ninfa Linden I examined her and feel that we can safely perform a  right total hip arthroplasty on her given her anatomy. Specialty Comments:  No specialty comments available.  Imaging: No results found.   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   Abdominal pain 03/08/2019   Pure hypercholesterolemia 09/25/2017   Morbid obesity ([place]) 06/07/2017   Encounter for Medicare annual wellness exam 06/07/2017   Vitamin D deficiency 06/07/2017   IBS (irritable bowel syndrome) 08/29/2016   Migraine 08/29/2016   Disorder of musculoskeletal system 03/23/2015   Eye exam abnormal 03/23/2015   NASH (nonalcoholic steatohepatitis) 10/19/2011   Past Medical History:  Diagnosis Date   Acute saddle pulmonary embolism ([place]) 00/8676   Complication of anesthesia    wants neck supported during surgery due to hx of migraine caused by concussion   Concussion 2010, 2000 and 1998   periodiotic migraine and occ dizzines   COVID-19 09/2019   sx covid pneumonia,fever, chills body aches, took monoclonal antibody tx done symptoms resolved after 6 weeks   CTS (carpal tunnel syndrome)    Fatty liver disease, nonalcoholic    In [facility] Study liver biopsy 2010 did not show fatty liver   History of blood transfusion 1975   after knee surgery   IBS (irritable bowel syndrome)    C/D   Localized swelling of both lower legs    go down with propping up of legs   Morbid obesity ([facility])    Osteoarthritis    oa   Pneumonia 09/2019   PONV (postoperative nausea and vomiting)    watch neck position needs support or gets n/v   Thyroid nodule    sees ent dr Radene Journey for q year    Uterine polyp     Family History  Problem Relation Age of Onset   Stroke Mother 5       Deceased   Hypertension Mother    GI problems Mother    Arthritis Mother    Dementia Mother    Heart defect Father 51       Deceased   Parkinson's disease Father    Varicose Veins Father    Stroke Maternal Grandfather    Arthritis Maternal Grandfather    Kidney failure Maternal Grandmother    Gallbladder disease Maternal Grandmother    Diverticulitis Sister        #1   Arthritis Sister    Healthy Sister        #2    Past  Surgical History:  Procedure Laterality Date   CARDIAC CATHETERIZATION  03/21/2019   with pfo closure   HYSTEROSCOPY WITH D & C N/A 04/06/2020   Procedure: DILATATION AND CURETTAGE /HYSTEROSCOPY;  Surgeon: Lavonia Drafts, MD;  Location: [facility];  Service: Gynecology;  Laterality: N/A;   IVC FILTER INSERTION  07/07/2019   KNEE SURGERY  1975   Left Patella Tendon Replacement   LIVER BIOPSY  2010   PATENT FORAMEN OVALE(PFO) CLOSURE  03/21/2019   TONSILLECTOMY  1963   TOTAL KNEE ARTHROPLASTY  06.2005   Left   TOTAL KNEE ARTHROPLASTY  11.2005   Right   UTERINE POLYP REMOVAL  2010   Social History   Occupational History   Occupation: retired  Tobacco Use   Smoking status: Never   Smokeless tobacco: Never  Vaping Use   Vaping Use: Never used  Substance and Sexual Activity   Alcohol use: No    Alcohol/week: 0.0 standard drinks   Drug use: No   Sexual activity: Never

## 2021-05-09 DIAGNOSIS — E7800 Pure hypercholesterolemia, unspecified: Secondary | ICD-10-CM | POA: Diagnosis not present

## 2021-05-25 ENCOUNTER — Ambulatory Visit (INDEPENDENT_AMBULATORY_CARE_PROVIDER_SITE_OTHER): Payer: Medicare Other

## 2021-05-25 DIAGNOSIS — M1611 Unilateral primary osteoarthritis, right hip: Secondary | ICD-10-CM

## 2021-05-25 DIAGNOSIS — E7800 Pure hypercholesterolemia, unspecified: Secondary | ICD-10-CM

## 2021-05-25 NOTE — Patient Instructions (Addendum)
Visit Information Exercises to do While Sitting Exercises that you do while sitting (chair exercises) can give you many of the same benefits as full exercise. Benefits include strengthening your heart, burning calories, and keeping muscles and joints healthy. Exercise can also improve your mood and help with depression and anxiety. You may benefit from chair exercises if you are unable to do standing exercises due to: Diabetic foot pain. Obesity. Illness. Arthritis. Recovery from surgery or injury. Breathing problems. Balance problems. Another type of disability. Before starting chair exercises, check with your health care provider or a physical therapist to find out how much exercise you can tolerate and which exercises are safe for you. If your health care provider approves: Start out slowly and build up over time. Aim to work up to about 10-20 minutes for each exercise session. Make exercise part of your daily routine. Drink water when you exercise. Do not wait until you are thirsty. Drink every 10-15 minutes. Stop exercising right away if you have pain, nausea, shortness of breath, or dizziness. If you are exercising in a wheelchair, make sure to lock the wheels. Ask your health care provider whether you can do tai chi or yoga. Many positions in these mind-body exercises can be modified to do while seated. Warm-up Before starting other exercises: Sit up as straight as you can. Have your knees bent at 90 degrees, which is the shape of the capital letter "L." Keep your feet flat on the floor. Sit at the front edge of your chair, if you can. Pull in (tighten) the muscles in your abdomen and stretch your spine and neck as straight as you can. Hold this position for a few minutes. Breathe in and out evenly. Try to concentrate on your breathing, and relax your mind. Stretching Exercise A: Arm stretch Hold your arms out straight in front of your body. Bend your hands at the wrist with your  fingers pointing up, as if signaling someone to stop. Notice the slight tension in your forearms as you hold the position. Keeping your arms out and your hands bent, rotate your hands outward as far as you can and hold this stretch. Aim to have your thumbs pointing up and your pinkie fingers pointing down. Slowly repeat arm stretches for one minute as tolerated. Exercise B: Leg stretch If you can move your legs, try to "draw" letters on the floor with the toes of your foot. Write your name with one foot. Write your name with the toes of your other foot. Slowly repeat the movements for one minute as tolerated. Exercise C: Reach for the sky Reach your hands as far over your head as you can to stretch your spine. Move your hands and arms as if you are climbing a rope. Slowly repeat the movements for one minute as tolerated. Range of motion exercises Exercise A: Shoulder roll Let your arms hang loosely at your sides. Lift just your shoulders up toward your ears, then let them relax back down. When your shoulders feel loose, rotate your shoulders in backward and forward circles. Do shoulder rolls slowly for one minute as tolerated. Exercise B: March in place As if you are marching, pump your arms and lift your legs up and down. Lift your knees as high as you can. If you are unable to lift your knees, just pump your arms and move your ankles and feet up and down. March in place for one minute as tolerated. Exercise C: Seated jumping jacks Let your arms  hang down straight. Keeping your arms straight, lift them up over your head. Aim to point your fingers to the ceiling. While you lift your arms, straighten your legs and slide your heels along the floor to your sides, as wide as you can. As you bring your arms back down to your sides, slide your legs back together. If you are unable to use your legs, just move your arms. Slowly repeat seated jumping jacks for one minute as  tolerated. Strengthening exercises Exercise A: Shoulder squeeze Hold your arms straight out from your body to your sides, with your elbows bent and your fists pointed at the ceiling. Keeping your arms in the bent position, move them forward so your elbows and forearms meet in front of your face. Open your arms back out as wide as you can with your elbows still bent, until you feel your shoulder blades squeezing together. Hold for 5 seconds. Slowly repeat the movements forward and backward for one minute as tolerated. Contact a health care provider if: You have to stop exercising due to any of the following: Pain. Nausea. Shortness of breath. Dizziness. Fatigue. You have significant pain or soreness after exercising. Get help right away if: You have chest pain. You have difficulty breathing. These symptoms may represent a serious problem that is an emergency. Do not wait to see if the symptoms will go away. Get medical help right away. Call your local emergency services (911 in the [place] Do not drive yourself to the hospital. Summary Exercises that you do while sitting (chair exercises) can strengthen your heart, burn calories, and keep muscles and joints healthy. You may benefit from chair exercises if you are unable to do standing exercises due to diabetic foot pain, obesity, recovery from surgery or injury, or other conditions. Before starting chair exercises, check with your health care provider or a physical therapist to find out how much exercise you can tolerate and which exercises are safe for you. This information is not intended to replace advice given to you by your health care provider. Make sure you discuss any questions you have with your health care provider. Document Revised: 08/22/2020 Document Reviewed: 08/22/2020 Elsevier Patient Education  2022 [place]             This Visit's Progress    RNCM:Achieve a Healthy Weight-Obesity   On track     Timeframe:  Long-Range Goal Priority:  Medium Start Date:       04/20/21                      Expected End Date:  10/08/21                     Follow Up Date 06/29/21    - drink 6 to 8 glasses of water each day - eat 3 to 5 servings of fruits and vegetables each day - eat fish at least once per week - fill half the plate with nonstarchy vegetables - keep a food diary - manage portion size - read food labels for fat, fiber, carbohydrates and portion size - reduce red meat to 2 to 3 times a week - set a realistic goal - set goal weight - switch to low-fat or skim milk - switch to sugar-free drinks    Why is this important?   When you are ready to manage your weight, have a plan and have set a goal, it is time  to take action.  Taking small steps to change how you eat and exercise is a good place to start.    Notes:      RNCM:Cope with Pain-Osteoarthritis   On track    Timeframe:  Long-Range Goal Priority:  High Start Date:       04/20/21                      Expected End Date:    10/08/21                   Follow Up Date 06/29/21    - learn how to meditate - learn relaxation techniques - practice acceptance of chronic pain - practice relaxation or meditation daily - tell myself I can (not I can't) - think of new ways to do favorite things - use distraction techniques - use relaxation during pain    Why is this important?   Living with joint pain and enjoying your life may be hard.  Feelings like depression or anger can make your pain worse.  Learning ways to cope may help you find some relief from the pain.    Notes:      RNCM:Eat Healthy   On track    Timeframe:  Long-Range Goal Priority:  Medium Start Date:          04/20/21                   Expected End Date:    10/08/21                   Follow Up Date 06/29/21    - set goal weight - drink 6 to 8 glasses of water each day - eat fish at least once per week - fill half of plate with vegetables - keep a food  diary - manage portion size - read food labels for fat, fiber, carbohydrates and portion size - reduce red meat to 2 to 3 times a week - set a realistic goal - switch to low-fat or skim milk - switch to sugar-free drinks    Why is this important?   When you are ready to manage your nutrition or weight, having a plan and setting goals will help.  Taking small steps to change how you eat and exercise is a good place to start.    Notes:          Patient verbalizes understanding of instructions provided today and agrees to view in [place].   Telephone follow up appointment with care management team member scheduled for: 06/29/21 at 11:30 AM Peter Garter RN, [facility], CDE Care Management Coordinator [facility] [phone], Mobile [phone]

## 2021-05-25 NOTE — Chronic Care Management (AMB) (Addendum)
Chronic Care Management   CCM RN Visit Note  05/25/2021 Name: Leslie Knapp MRN: [id number] DOB: [birth year]  Subjective: Leslie Knapp is a 70 y.o. year old female who is a primary care patient of Leslie Coss, NP. The care management team was consulted for assistance with disease management and care coordination needs.    Engaged with patient by telephone for follow up visit in response to provider referral for case management and/or care coordination services.   Consent to Services:  The patient was given information about Chronic Care Management services, agreed to services, and gave verbal consent prior to initiation of services.  Please see initial visit note for detailed documentation.   Patient agreed to services and verbal consent obtained.   Assessment: Review of patient past medical history, allergies, medications, health status, including review of consultants reports, laboratory and other test data, was performed as part of comprehensive evaluation and provision of chronic care management services.   SDOH (Social Determinants of Health) assessments and interventions performed:    CCM Care Plan  Allergies  Allergen Reactions   Celebrex [Celecoxib] Swelling and Rash   Lactose Intolerance (Gi) Other (See Comments)    Outpatient Encounter Medications as of 05/25/2021  Medication Sig   apixaban (ELIQUIS) 2.5 MG TABS tablet Take 1 tablet (2.5 mg total) by mouth 2 (two) times daily.   Cholecalciferol (VITAMIN D-3) 1000 UNITS CAPS Take 1 each by mouth daily. Liquid d 3 5 drops per day   Digestive Enzymes (ENZYME DIGEST) CAPS Take by mouth as needed.    NATURAL C/ROSE HIPS 1000 MG tablet Take 2,000 mg by mouth daily.   NON FORMULARY ROLFING: Deep Tissue Massage, 2 Txs weekly as needed for joint pain   Trace Min [id number] (MINERALS PO) Take 1 oz by mouth daily.   traMADol (ULTRAM) 50 MG tablet Take 1 tablet (50 mg total) by mouth every 6 (six) hours as needed.  (Patient not taking: Reported on 03/21/2021)   No facility-administered encounter medications on file as of 05/25/2021.    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   Abdominal pain 03/08/2019   Pure hypercholesterolemia 09/25/2017   Morbid obesity ([place]) 06/07/2017   Encounter for Medicare annual wellness exam 06/07/2017   Vitamin D deficiency 06/07/2017   IBS (irritable bowel syndrome) 08/29/2016   Migraine 08/29/2016   Disorder of musculoskeletal system 03/23/2015   Eye exam abnormal 03/23/2015   NASH (nonalcoholic steatohepatitis) 10/19/2011    Conditions to be addressed/monitored:HLD, Osteoarthritis, and obesity, chronic pain  Care Plan : Chronic pain  Updates made by Dimitri Ped, RN since 05/25/2021 12:00 AM     Problem: Limited  Mobility and Function (Osteoarthritis)   Priority: High     Long-Range Goal: Maintain Mobility and Function   Start Date: 04/20/2021  Expected End Date: 10/08/2021  This Visit's Progress: On track  Recent Progress: On track  Priority: High  Note:   Current Barriers:  Knowledge Deficits related to self-health management of acute or chronic pain rt hip and back due to arthritis Chronic Disease Management support and education needs related to chronic pain Knowledge Deficits related to  self management of chronic pain rt hip and back Chronic Disease Management support and education needs related to self management of chronic pain rt hip and back Unable to independently self management of chronic pain rt hip and back Unable to perform IADLs  independently States she started having pain in her rt hip and back in July.  States she mostly laying down or sitting in chair,  States she does not like to take the tramadol for pain.  States she uses magnets at night which help with her pain some.  States she has a massage therapist come to her home once a week and this help with her pain and muscle tightness. States she saw Dr.Blackman and she needs to have a hip replacement but her BMI is too high for the recommendations unless she has a non [facility] provider refer her.  States she is considering changing to Dr.Hilts for primary care but states she would like to come back to [place] after she has had her surgeries.  States she is still working on losing weight  Clinical Goal(s):  patient will verbalize understanding of plan for pain management. , patient will attend all scheduled medical appointments: Ortho nurse 06/10/21 , patient will demonstrate use of different relaxation  skills and/or diversional activities to assist with pain reduction (distraction, imagery, relaxation, massage, acupressure, TENS, heat, and cold application., patient will report pain at a level less than 3 to 4 on a 10-10 rating scale., patient will use pharmacological and nonpharmacological pain relief strategies as prescribed. , patient will verbalize acceptable level of pain relief and ability to engage in desired activities, and patient will engage in desired activities without an increase in pain level Interventions:  Collaboration with Leslie Coss, NP regarding development and update of comprehensive plan of care as evidenced by provider attestation and co-signature Pain assessment performed Medications reviewed Discussed plans with patient for ongoing care management follow up and provided patient with direct contact information for care management team Evaluation of current treatment plan related to self management of chronic pain rt hip and back and patient's adherence  to plan as established by provider. Provided education to patient re: self management of chronic pain rt hip and back Provided patient with written educational materials related to self management of chronic pain rt hip and back Reviewed scheduled/upcoming provider appointments including:  Discussed plans with patient for ongoing care management follow up and provided patient with direct contact information for care management team Discussed that she will need to make the decision on her primary care provider that is best for her and to let the [place] office know if she does decide to change providers Patient Goals/Self Care Activities:  Will self-administer medications as prescribed Will attend all scheduled provider appointments Will call pharmacy for medication refills 7 days prior to needed refill date Patient will calls provider office for new concerns or questions - learn how to meditate - learn relaxation techniques - practice acceptance of chronic pain - practice relaxation or meditation daily - tell myself I can (not I can't) - think of new ways to do favorite things - use distraction techniques - use relaxation during pain Follow Up Plan: Telephone follow up appointment with care management team member scheduled for: 06/29/21 at 11:30 AM The patient has been provided with contact information for the care management team and has been advised to call with any health related questions or concerns.       Care Plan : Obesity (Adult)  Updates  made by Dimitri Ped, RN since 05/25/2021 12:00 AM     Problem: Weight Management (Obesity)   Priority: Medium     Long-Range Goal: Weight Loss Achieved   Start Date: 04/20/2021  Expected End Date: 10/08/2021  This Visit's Progress: On track  Recent Progress: On track  Priority: Medium  Note:   Current Barriers:  Ineffective Self Health Maintenance in a patient with  obesity with HLD Unable to independently self manage  obesity Unable to perform IADLs independently States she has been working with a dietitian to lose weight.  States she has lost 19 lbs since August.  States she is following a low CHO diet with mostly protein and vegetables and fruit.  States she want to get her BMI to 40 or below so she can have her hip surgery.  States she has not been able to exercise due to her hip and back pain States she continues to lose about one lb a week.  States she is eating about 1200 cal a day and is working with a Microbiologist.  States she is eating a low CHO diet with more protein Clinical Goal(s):  Collaboration with Leslie Coss, NP regarding development and update of comprehensive plan of care as evidenced by provider attestation and co-signature Inter-disciplinary care team collaboration (see longitudinal plan of care) patient will work with care management team to address care coordination and chronic disease management needs related to Disease Management Educational Needs Care Coordination   Interventions:  Evaluation of current treatment plan related to  self management of obesity , Inability to perform IADL's independently self-management and patient's adherence to plan as established by provider. Collaboration with Leslie Coss, NP regarding development and update of comprehensive plan of care as evidenced by provider attestation       and co-signature Inter-disciplinary care team collaboration (see longitudinal plan of care) Discussed plans with patient for ongoing care management follow up and provided patient with direct contact information for care management team Reinforced to continue to follow a low CHO diet Reinforced to avoid saturated fats, trans-fats and eat more fiber Reinforced to lower fatty foods, red meat, cheese, milk and increase fiber like whole grains and vegetables. Reviewed to try to do chair exercises such as chair yoga and to look at You Tube for videos to help Self Care  Activities:  Self administers medications as prescribed Attends all scheduled provider appointments Performs IADL's independently Calls provider office for new concerns or questions Patient Goals: - drink 6 to 8 glasses of water each day - eat 3 to 5 servings of fruits and vegetables each day - eat fish at least once per week - fill half the plate with nonstarchy vegetables - keep a food diary - manage portion size - read food labels for fat, fiber, carbohydrates and portion size - reduce red meat to 2 to 3 times a week - set a realistic goal - set goal weight - switch to low-fat or skim milk - switch to sugar-free drinks - Follow Up Plan: Telephone follow up appointment with care management team member scheduled for: 06/29/21 at 11:30 AM The patient has been provided with contact information for the care management team and has been advised to call with any health related questions or concerns.       Plan:Telephone follow up appointment with care management team member scheduled for:  06/29/21 The patient has been provided with contact information for the care management team and has been advised to call with  any health related questions or concerns.  Peter Garter RN, BSN,CCM, CDE Care Management Coordinator [place] [phone], Mobile [phone]

## 2021-05-26 ENCOUNTER — Telehealth: Payer: Self-pay | Admitting: *Deleted

## 2021-05-26 NOTE — Telephone Encounter (Signed)
Patient and I spoke regarding her anticipated scheduling of her total hip arthroplasty with Dr. Ninfa Linden. She has currently switched over to Dr. Junius Roads for her Primary care medical needs. He is outside of the [facility] network. I explained that even though we know that he does not participate in this [facility] network and she would not be an Ortho bundle, the chart may still reflect that she is under [facility] for a while-until it is changed. CM really unsure how this works. I explained that NCM would not be following her as a bundle patient and if Dr. Ninfa Linden feels he can safely proceed with surgery, then she is free to schedule with his surgery scheduler, who will be in touch. No need for weight check appointment with [facility] in office in December. She states she continues to lose about 1 lb/week, which her nutritionist is very pleased with. Encouragement given to continue with her weight loss.

## 2021-05-27 ENCOUNTER — Telehealth: Payer: Self-pay | Admitting: *Deleted

## 2021-05-27 NOTE — Telephone Encounter (Signed)
Error message

## 2021-06-09 ENCOUNTER — Other Ambulatory Visit: Payer: Self-pay | Admitting: Physician Assistant

## 2021-06-09 DIAGNOSIS — M1611 Unilateral primary osteoarthritis, right hip: Secondary | ICD-10-CM

## 2021-06-09 NOTE — Progress Notes (Addendum)
PCP - Eunice Blase MD Cardiologist - no  PPM/ICD -  Device Orders -  Rep Notified -   Chest x-ray -  EKG - 06-13-21  Stress Test -  ECHO -  Cardiac Cath -   Sleep Study -  CPAP -   Fasting Blood Sugar -  Checks Blood Sugar _____ times a day  Blood Thinner Instructions:Elequis stop 3 days prior Aspirin Instructions:  ERAS Protcol - PRE-SURGERY Ensure   COVID TEST- 06-15-21 COVID vaccine -no  Activity--able to complete ADL's without SOB Anesthesia review: PE , BMI 43.56, BP elevated at preop pt. Advised to check BP at home and report to PCP if continues to be elevated. Jessica Ward PA-C aware   Patient denies shortness of breath, fever, cough and chest pain at PAT appointment   All instructions explained to the patient, with a verbal understanding of the material. Patient agrees to go over the instructions while at home for a better understanding. Patient also instructed to self quarantine after being tested for COVID-19. The opportunity to ask questions was provided.

## 2021-06-09 NOTE — Progress Notes (Signed)
Please place orders in epic for preop 

## 2021-06-09 NOTE — Patient Instructions (Addendum)
DUE TO COVID-19 ONLY ONE VISITOR IS ALLOWED TO COME WITH YOU AND STAY IN THE WAITING ROOM ONLY DURING PRE OP AND PROCEDURE DAY OF SURGERY.   Up to two visitors ages 16+ are allowed at one time in a patient's room.  The visitors may rotate out with other people throughout the day.  Additionally, up to two children between the ages of 66 and 67 are allowed and do not count toward the number of allowed visitors.  Children within this age range must be accompanied by an adult visitor.  One adult visitor may remain with the patient overnight and must be in the room by 8 PM.  YOU NEED TO HAVE A COVID 19 TEST ON__12-7-22_____ between 8am-3pm______, THIS TEST MUST BE DONE BEFORE SURGERY,     Please bring completed form with you to the COVID testing site   COVID TESTING SITE [place] TEST IS COMPLETED,  PLEASE Wear a mask when in public           Your procedure is scheduled on: 06-17-21   Report to [facility] Main  Entrance   Report to admitting at     0930  AM     Call this number if you have problems the morning of surgery [phone]   Remember: NO SOLID FOOD AFTER MIDNIGHT THE NIGHT PRIOR TO SURGERY. NOTHING BY MOUTH EXCEPT CLEAR LIQUIDS UNTIL    0915 am  . PLEASE FINISH ENSURE DRINK PER SURGEON ORDER  WHICH NEEDS TO BE COMPLETED AT      0915 am then nothing by mouth.     CLEAR LIQUID DIET                                                                    water Black Coffee and tea, regular and decaf No Creamer                            Plain Jell-O any favor except red or purple                                  Fruit ices (not with fruit pulp)                                      Iced Popsicles                                     Carbonated beverages, regular and diet                                    Cranberry, grape and apple juices Sports drinks like Gatorade Lightly seasoned clear broth or consume(fat  free) Sugar, honey syrup   _____________________________________________________________________     BRUSH YOUR TEETH MORNING OF SURGERY AND RINSE YOUR MOUTH OUT, NO CHEWING GUM  CANDY OR MINTS.     Take these medicines the morning of surgery with A SIP OF WATER: NONE                                 You may not have any metal on your body including hair pins and              piercings  Do not wear jewelry, make-up, lotions, powders,perfumes,        deodorant             Do not wear nail polish on your fingernails or toenails .  Do not shave  48 hours prior to surgery.              Do not bring valuables to the hospital. [place].  Contacts, dentures or bridgework may not be worn into surgery.  You may bring a small overnight bag with you     Patients discharged the day of surgery will not be allowed to drive home. IF YOU ARE HAVING SURGERY AND GOING HOME THE SAME DAY, YOU MUST HAVE AN ADULT TO DRIVE YOU HOME AND BE WITH YOU FOR 24 HOURS. YOU MAY GO HOME BY TAXI OR UBER OR ORTHERWISE, BUT AN ADULT MUST ACCOMPANY YOU HOME AND STAY WITH YOU FOR 24 HOURS.  Name and phone number of your driver:  Special Instructions: N/A              Please read over the following fact sheets you were given: _____________________________________________________________________             [facility] - Preparing for Surgery Before surgery, you can play an important role.  Because skin is not sterile, your skin needs to be as free of germs as possible.  You can reduce the number of germs on your skin by washing with CHG (chlorahexidine gluconate) soap before surgery.  CHG is an antiseptic cleaner which kills germs and bonds with the skin to continue killing germs even after washing. Please DO NOT use if you have an allergy to CHG or antibacterial soaps.  If your skin becomes reddened/irritated stop using the CHG and inform your nurse when you arrive at  Short Stay. Do not shave (including legs and underarms) for at least 48 hours prior to the first CHG shower.  You may shave your face/neck. Please follow these instructions carefully:  1.  Shower with CHG Soap the night before surgery and the  morning of Surgery.  2.  If you choose to wash your hair, wash your hair first as usual with your  normal  shampoo.  3.  After you shampoo, rinse your hair and body thoroughly to remove the  shampoo.                           4.  Use CHG as you would any other liquid soap.  You can apply chg directly  to the skin and wash                       Gently with a scrungie or clean washcloth.  5.  Apply the CHG Soap to your body ONLY FROM THE NECK DOWN.   Do not use on face/ open  Wound or open sores. Avoid contact with eyes, ears mouth and genitals (private parts).                       Wash face,  Genitals (private parts) with your normal soap.             6.  Wash thoroughly, paying special attention to the area where your surgery  will be performed.  7.  Thoroughly rinse your body with warm water from the neck down.  8.  DO NOT shower/wash with your normal soap after using and rinsing off  the CHG Soap.                9.  Pat yourself dry with a clean towel.            10.  Wear clean pajamas.            11.  Place clean sheets on your bed the night of your first shower and do not  sleep with pets. Day of Surgery : Do not apply any lotions/deodorants the morning of surgery.  Please wear clean clothes to the hospital/surgery center.  FAILURE TO FOLLOW THESE INSTRUCTIONS MAY RESULT IN THE CANCELLATION OF YOUR SURGERY PATIENT SIGNATURE_________________________________  NURSE SIGNATURE__________________________________  ________________________________________________________________________    Leslie Knapp  An incentive spirometer is a tool that can help keep your lungs clear and active. This tool measures how well you are  filling your lungs with each breath. Taking long deep breaths may help reverse or decrease the chance of developing breathing (pulmonary) problems (especially infection) following: A long period of time when you are unable to move or be active. BEFORE THE PROCEDURE  If the spirometer includes an indicator to show your best effort, your nurse or respiratory therapist will set it to a desired goal. If possible, sit up straight or lean slightly forward. Try not to slouch. Hold the incentive spirometer in an upright position. INSTRUCTIONS FOR USE  Sit on the edge of your bed if possible, or sit up as far as you can in bed or on a chair. Hold the incentive spirometer in an upright position. Breathe out normally. Place the mouthpiece in your mouth and seal your lips tightly around it. Breathe in slowly and as deeply as possible, raising the piston or the ball toward the top of the column. Hold your breath for 3-5 seconds or for as long as possible. Allow the piston or ball to fall to the bottom of the column. Remove the mouthpiece from your mouth and breathe out normally. Rest for a few seconds and repeat Steps 1 through 7 at least 10 times every 1-2 hours when you are awake. Take your time and take a few normal breaths between deep breaths. The spirometer may include an indicator to show your best effort. Use the indicator as a goal to work toward during each repetition. After each set of 10 deep breaths, practice coughing to be sure your lungs are clear. If you have an incision (the cut made at the time of surgery), support your incision when coughing by placing a pillow or rolled up towels firmly against it. Once you are able to get out of bed, walk around indoors and cough well. You may stop using the incentive spirometer when instructed by your caregiver.  RISKS AND COMPLICATIONS Take your time so you do not get dizzy or light-headed. If you are in pain, you may need to take or  ask for pain  medication before doing incentive spirometry. It is harder to take a deep breath if you are having pain. AFTER USE Rest and breathe slowly and easily. It can be helpful to keep track of a log of your progress. Your caregiver can provide you with a simple table to help with this. If you are using the spirometer at home, follow these instructions: [place] IF:  You are having difficultly using the spirometer. You have trouble using the spirometer as often as instructed. Your pain medication is not giving enough relief while using the spirometer. You develop fever of 100.5 F (38.1 C) or higher. SEEK IMMEDIATE MEDICAL CARE IF:  You cough up bloody sputum that had not been present before. You develop fever of 102 F (38.9 C) or greater. You develop worsening pain at or near the incision site. MAKE SURE YOU:  Understand these instructions. Will watch your condition. Will get help right away if you are not doing well or get worse. Document Released: 11/06/2006 Document Revised: 09/18/2011 Document Reviewed: 01/07/2007 [facility] Patient Information 2014 [place], [place].   ________________________________________________________________________

## 2021-06-10 ENCOUNTER — Ambulatory Visit: Payer: Medicare Other

## 2021-06-10 ENCOUNTER — Other Ambulatory Visit: Payer: Self-pay

## 2021-06-13 ENCOUNTER — Encounter: Payer: Self-pay | Admitting: Hematology & Oncology

## 2021-06-13 ENCOUNTER — Other Ambulatory Visit: Payer: Self-pay

## 2021-06-13 ENCOUNTER — Encounter (HOSPITAL_COMMUNITY)
Admission: RE | Admit: 2021-06-13 | Discharge: 2021-06-13 | Disposition: A | Discharge status: Home / Self Care | Payer: Medicare Other | Source: Ambulatory Visit | Attending: Orthopaedic Surgery | Admitting: Orthopaedic Surgery

## 2021-06-13 ENCOUNTER — Encounter (HOSPITAL_COMMUNITY): Payer: Self-pay

## 2021-06-13 VITALS — BP 153/92 | HR 82 | Temp 98.6°F | Resp 16 | Ht 69.0 in | Wt 295.0 lb

## 2021-06-13 DIAGNOSIS — Z01818 Encounter for other preprocedural examination: Secondary | ICD-10-CM | POA: Insufficient documentation

## 2021-06-13 DIAGNOSIS — Z7901 Long term (current) use of anticoagulants: Secondary | ICD-10-CM | POA: Insufficient documentation

## 2021-06-13 DIAGNOSIS — M1611 Unilateral primary osteoarthritis, right hip: Secondary | ICD-10-CM | POA: Insufficient documentation

## 2021-06-13 DIAGNOSIS — Z86711 Personal history of pulmonary embolism: Secondary | ICD-10-CM | POA: Diagnosis not present

## 2021-06-13 HISTORY — DX: Headache, unspecified: R51.9

## 2021-06-13 LAB — CBC
HCT: 47.4 % — ABNORMAL HIGH (ref 36.0–46.0)
Hemoglobin: 15.5 g/dL — ABNORMAL HIGH (ref 12.0–15.0)
MCH: 29.6 pg (ref 26.0–34.0)
MCHC: 32.7 g/dL (ref 30.0–36.0)
MCV: 90.6 fL (ref 80.0–100.0)
Platelets: 210 10*3/uL (ref 150–400)
RBC: 5.23 MIL/uL — ABNORMAL HIGH (ref 3.87–5.11)
RDW: 12.8 % (ref 11.5–15.5)
WBC: 6.5 10*3/uL (ref 4.0–10.5)
nRBC: 0 % (ref 0.0–0.2)

## 2021-06-13 LAB — SURGICAL PCR SCREEN
MRSA, PCR: NEGATIVE
Staphylococcus aureus: NEGATIVE

## 2021-06-14 ENCOUNTER — Inpatient Hospital Stay (HOSPITAL_BASED_OUTPATIENT_CLINIC_OR_DEPARTMENT_OTHER): Payer: Medicare Other | Admitting: Hematology & Oncology

## 2021-06-14 ENCOUNTER — Other Ambulatory Visit: Payer: Self-pay

## 2021-06-14 ENCOUNTER — Telehealth: Payer: Self-pay

## 2021-06-14 ENCOUNTER — Inpatient Hospital Stay: Payer: Medicare Other | Attending: Hematology & Oncology

## 2021-06-14 ENCOUNTER — Encounter: Payer: Self-pay | Admitting: Hematology & Oncology

## 2021-06-14 VITALS — BP 120/76 | HR 79 | Temp 98.1°F | Resp 18 | Wt 293.5 lb

## 2021-06-14 DIAGNOSIS — E7800 Pure hypercholesterolemia, unspecified: Secondary | ICD-10-CM

## 2021-06-14 DIAGNOSIS — I70208 Unspecified atherosclerosis of native arteries of extremities, other extremity: Secondary | ICD-10-CM | POA: Diagnosis not present

## 2021-06-14 DIAGNOSIS — I742 Embolism and thrombosis of arteries of the upper extremities: Secondary | ICD-10-CM | POA: Insufficient documentation

## 2021-06-14 DIAGNOSIS — D751 Secondary polycythemia: Secondary | ICD-10-CM

## 2021-06-14 DIAGNOSIS — E559 Vitamin D deficiency, unspecified: Secondary | ICD-10-CM

## 2021-06-14 DIAGNOSIS — I4510 Unspecified right bundle-branch block: Secondary | ICD-10-CM | POA: Insufficient documentation

## 2021-06-14 DIAGNOSIS — D6859 Other primary thrombophilia: Secondary | ICD-10-CM | POA: Diagnosis not present

## 2021-06-14 DIAGNOSIS — M2550 Pain in unspecified joint: Secondary | ICD-10-CM | POA: Diagnosis not present

## 2021-06-14 DIAGNOSIS — Z886 Allergy status to analgesic agent status: Secondary | ICD-10-CM | POA: Diagnosis not present

## 2021-06-14 DIAGNOSIS — Z79899 Other long term (current) drug therapy: Secondary | ICD-10-CM | POA: Diagnosis not present

## 2021-06-14 DIAGNOSIS — Z7901 Long term (current) use of anticoagulants: Secondary | ICD-10-CM | POA: Insufficient documentation

## 2021-06-14 LAB — CBC WITH DIFFERENTIAL (CANCER CENTER ONLY)
Abs Immature Granulocytes: 0.02 10*3/uL (ref 0.00–0.07)
Basophils Absolute: 0 10*3/uL (ref 0.0–0.1)
Basophils Relative: 1 %
Eosinophils Absolute: 0.2 10*3/uL (ref 0.0–0.5)
Eosinophils Relative: 3 %
HCT: 47.5 % — ABNORMAL HIGH (ref 36.0–46.0)
Hemoglobin: 15.7 g/dL — ABNORMAL HIGH (ref 12.0–15.0)
Immature Granulocytes: 0 %
Lymphocytes Relative: 27 %
Lymphs Abs: 1.8 10*3/uL (ref 0.7–4.0)
MCH: 29.7 pg (ref 26.0–34.0)
MCHC: 33.1 g/dL (ref 30.0–36.0)
MCV: 90 fL (ref 80.0–100.0)
Monocytes Absolute: 0.5 10*3/uL (ref 0.1–1.0)
Monocytes Relative: 7 %
Neutro Abs: 4 10*3/uL (ref 1.7–7.7)
Neutrophils Relative %: 62 %
Platelet Count: 199 10*3/uL (ref 150–400)
RBC: 5.28 MIL/uL — ABNORMAL HIGH (ref 3.87–5.11)
RDW: 12.7 % (ref 11.5–15.5)
WBC Count: 6.4 10*3/uL (ref 4.0–10.5)
nRBC: 0 % (ref 0.0–0.2)

## 2021-06-14 LAB — CMP (CANCER CENTER ONLY)
ALT: 10 U/L (ref 0–44)
AST: 15 U/L (ref 15–41)
Albumin: 4.2 g/dL (ref 3.5–5.0)
Alkaline Phosphatase: 94 U/L (ref 38–126)
Anion gap: 7 (ref 5–15)
BUN: 13 mg/dL (ref 8–23)
CO2: 29 mmol/L (ref 22–32)
Calcium: 10.2 mg/dL (ref 8.9–10.3)
Chloride: 104 mmol/L (ref 98–111)
Creatinine: 0.64 mg/dL (ref 0.44–1.00)
GFR, Estimated: >60 mL/min (ref >60)
Glucose, Bld: 101 mg/dL — ABNORMAL HIGH (ref 70–99)
Potassium: 4.8 mmol/L (ref 3.5–5.1)
Sodium: 140 mmol/L (ref 135–145)
Total Bilirubin: 0.8 mg/dL (ref 0.3–1.2)
Total Protein: 6.9 g/dL (ref 6.5–8.1)

## 2021-06-14 LAB — LIPID PANEL
Cholesterol: 263 mg/dL — ABNORMAL HIGH (ref 0–200)
HDL: 64 mg/dL (ref >40)
LDL Cholesterol: 184 mg/dL — ABNORMAL HIGH (ref 0–99)
Total CHOL/HDL Ratio: 4.1 RATIO
Triglycerides: 76 mg/dL (ref <150)
VLDL: 15 mg/dL (ref 0–40)

## 2021-06-14 LAB — VITAMIN D 25 HYDROXY (VIT D DEFICIENCY, FRACTURES): Vit D, 25-Hydroxy: 54.04 ng/mL (ref 30–100)

## 2021-06-14 NOTE — Progress Notes (Signed)
Anesthesia Chart Review   Case: [id number] Date/Time: 06/17/21 0930   Procedure: RIGHT TOTAL HIP ARTHROPLASTY ANTERIOR APPROACH (Right: Hip)   Anesthesia type: Spinal   Pre-op diagnosis: right hip osteoarthritis   Location: [facility] ROOM 09 / [facility]   Surgeons: Mcarthur Rossetti, MD       DISCUSSION:70 y.o. never smoker with h/o PONV, bilateral saddle PE, right hip OA scheduled for above procedure 06/17/2021 with Dr. Jean Knapp.   Pt follows with hematology due to h/o bilateral saddle PE.  Last seen 06/14/2021, advised to hold Eliquis 2 days prior to surgery. Dr. Trevor Mace office informed as pt is scheduled with a spinal.  VS: BP (!) 153/92   Pulse 82   Temp 37 C (Oral)   Resp 16   Ht 5\' 9"  (1.753 m)   Wt 133.8 kg   SpO2 96%   BMI 43.56 kg/m   PROVIDERS: Hilts, Legrand Como, MD is PCP   Burney Gauze, MD is Hematologist  LABS: Labs reviewed: Acceptable for surgery. (all labs ordered are listed, but only abnormal results are displayed)  Labs Reviewed  CBC - Abnormal; Notable for the following components:      Result Value   RBC 5.23 (*)    Hemoglobin 15.5 (*)    HCT 47.4 (*)    All other components within normal limits  SURGICAL PCR SCREEN  TYPE AND SCREEN     IMAGES:   EKG: 06/14/2021 Rate 75 bpm  NSR RBBB  CV: Echo 11/13/2017  - Left ventricle: The cavity size was normal. There was mild    concentric hypertrophy. Systolic function was normal. The    estimated ejection fraction was in the range of 55% to 60%. Wall    motion was normal; there were no regional wall motion    abnormalities. Doppler parameters are consistent with abnormal    left ventricular relaxation (grade 1 diastolic dysfunction).  - Ventricular septum: The outflow septum had a sigmoid appearance.  - Aortic valve: There was trivial regurgitation.  Past Medical History:  Diagnosis Date   Acute saddle pulmonary embolism ([place]) 62/8315   Complication of anesthesia    wants neck  supported during surgery due to hx of migraine caused by concussion   Concussion 2010, 2000 and 1998   periodiotic migraine and occ dizzines   COVID-19 09/2019   sx covid pneumonia,fever, chills body aches, took monoclonal antibody tx done symptoms resolved after 6 weeks   CTS (carpal tunnel syndrome)    Fatty liver disease, nonalcoholic    In [facility] liver biopsy 2010 did not show fatty liver   Headache    History of blood transfusion 1975   after knee surgery   IBS (irritable bowel syndrome)    C/D   Localized swelling of both lower legs    go down with propping up of legs   Morbid obesity (HCC)    Osteoarthritis    oa   Pneumonia 09/2019   PONV (postoperative nausea and vomiting)    watch neck position needs support or gets n/v   Thyroid nodule    sees ent dr Radene Journey for q year   Uterine polyp     Past Surgical History:  Procedure Laterality Date   CARDIAC CATHETERIZATION  03/21/2019   with pfo closure   HYSTEROSCOPY WITH D & C N/A 04/06/2020   Procedure: DILATATION AND CURETTAGE /HYSTEROSCOPY;  Surgeon: Lavonia Drafts, MD;  Location: [facility];  Service: Gynecology;  Laterality: N/A;  IVC FILTER INSERTION  07/07/2019   KNEE SURGERY  1975   Left Patella Tendon Replacement   LIVER BIOPSY  2010   PATENT FORAMEN OVALE(PFO) CLOSURE  03/21/2019   right arm blood clot removal     TONSILLECTOMY  1963   TOTAL KNEE ARTHROPLASTY  12/2003   Left   TOTAL KNEE ARTHROPLASTY  05/2004   Right   UTERINE POLYP REMOVAL  2010    MEDICATIONS:  apixaban (ELIQUIS) 2.5 MG TABS tablet   Cholecalciferol 25 MCG/0.03ML LIQD   Digestive Enzymes (ENZYME DIGEST) CAPS   Trace Min CaCrCuFeKMgMnPSeZn (MINERALS PO)   traMADol (ULTRAM) 50 MG tablet   No current facility-administered medications for this encounter.   [name], PA-C [facility] [phone]

## 2021-06-14 NOTE — Progress Notes (Signed)
Hematology and Oncology Follow Up Visit  Leslie Knapp [id number] 02/23/1951 70 y.o. 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  She is can have surgery for her left hip on Friday.  I wanted her to come back so I can make sure that we see her before she has her surgery.  She is on Eliquis 2.5 mg p.o. twice daily.  She done well with this.  I told him to stop the Eliquis 2 days before her surgery.  I think 1 problem that she may have that with her preop work-up yesterday, she had an EKG which showed a new right bundle branch block.  She wished to have a EKG done.  We did 1 in the office.  This shows the right bundle branch block.  I told had I am not sure that this is going to stop her from having surgery.  She does not seem to be symptomatic with this.  Her electrolytes all look fine.  I think that if she gets a call from her orthopedic surgeon, this probably would indicate that he will postpone her surgery and have cardiology look at her EKG and do a cardiac clearance.  She is having no problems with chest wall pain.  Her CT angiogram that was done a year and a half ago, did not show any pulmonary embolism.  She is having no nausea or vomiting.  She is having no leg pain.    Currently, I would say performance status is probably ECOG 1.      Medications:  Allergies as of 06/14/2021       Reactions   Celebrex [celecoxib] Swelling, Rash   Lactose Intolerance (gi) Other (See Comments)        Medication List        Accurate as of June 14, 2021 10:44 AM. If you have any questions, ask your nurse or doctor.          apixaban 2.5 MG Tabs tablet Commonly known as: ELIQUIS Take 1 tablet (2.5 mg total)  by mouth 2 (two) times daily.   Cholecalciferol 25 MCG/0.03ML Liqd Take 1,000 Units by mouth once a week.   Enzyme Digest Caps Take 1 capsule by mouth daily as needed (heartburn).   MINERALS PO Take 1 capsule by mouth daily.   traMADol 50 MG tablet Commonly known as: ULTRAM Take 1 tablet (50 mg total) by mouth every 6 (six) hours as needed.        Allergies:  Allergies  Allergen Reactions   Celebrex [Celecoxib] Swelling and Rash   Lactose Intolerance (Gi) Other (See Comments)    Past Medical History, Surgical history, Social history, and Family History were reviewed and updated.  Review of Systems: Review of Systems  Constitutional: Negative.   HENT: Negative.    Eyes: Negative.   Respiratory: Negative.    Cardiovascular: Negative.   Gastrointestinal: Negative.   Genitourinary: Negative.   Musculoskeletal:  Positive for joint pain.  Skin: Negative.   Neurological: Negative.   Endo/Heme/Allergies: Negative.   Psychiatric/Behavioral: Negative.      Physical Exam:  weight is 293 lb 8 oz (133.1 kg). Her oral temperature is 98.1 F (36.7 C). Her blood pressure is  120/76 and her pulse is 79. Her respiration is 18 and oxygen saturation is 98%.   Wt Readings from Last 3 Encounters:  06/14/21 293 lb 8 oz (133.1 kg)  06/13/21 295 lb (133.8 kg)  05/04/21 279 lb (126.6 kg)    Physical Exam Vitals reviewed.  HENT:     Head: Normocephalic and atraumatic.  Eyes:     Pupils: Pupils are equal, round, and reactive to light.  Cardiovascular:     Rate and Rhythm: Normal rate and regular rhythm.     Heart sounds: Normal heart sounds.  Pulmonary:     Effort: Pulmonary effort is normal.     Breath sounds: Normal breath sounds.  Abdominal:     General: Bowel sounds are normal.     Palpations: Abdomen is soft.  Musculoskeletal:        General: No tenderness or deformity. Normal range of motion.     Cervical back: Normal range of motion.  Lymphadenopathy:     Cervical:  No cervical adenopathy.  Skin:    General: Skin is warm and dry.     Findings: No erythema or rash.  Neurological:     Mental Status: She is alert and oriented to person, place, and time.  Psychiatric:        Behavior: Behavior normal.        Thought Content: Thought content normal.        Judgment: Judgment normal.     Lab Results  Component Value Date   WBC 6.4 06/14/2021   HGB 15.7 (H) 06/14/2021   HCT 47.5 (H) 06/14/2021   MCV 90.0 06/14/2021   PLT 199 06/14/2021   Lab Results  Component Value Date   FERRITIN 129 08/23/2020   IRON 63 08/23/2020   TIBC 383 08/23/2020   UIBC 319 08/23/2020   IRONPCTSAT 16 (L) 08/23/2020   Lab Results  Component Value Date   RBC 5.28 (H) 06/14/2021   No results found for: KPAFRELGTCHN, LAMBDASER, KAPLAMBRATIO No results found for: IGGSERUM, IGA, IGMSERUM No results found for: Odetta Pink, SPEI   Chemistry      Component Value Date/Time   NA 140 06/14/2021 0818   NA 140 09/24/2019 1934   K 4.8 06/14/2021 0818   CL 104 06/14/2021 0818   CO2 29 06/14/2021 0818   BUN 13 06/14/2021 0818   BUN 15 09/24/2019 1934   CREATININE 0.64 06/14/2021 0818   GLU 87 05/21/2018 0000      Component Value Date/Time   CALCIUM 10.2 06/14/2021 0818   ALKPHOS 94 06/14/2021 0818   AST 15 06/14/2021 0818   ALT 10 06/14/2021 0818   BILITOT 0.8 06/14/2021 0818       Impression and Plan: Leslie Knapp is a very pleasant 70 yo caucasian female with history of bilateral saddle pulmonary emboli with right heart strain as well as a right brachial artery thrombus.  Her initial lab work up did show both a protein C and a protein S deficiency at the time.  However, we have repeated these and everything is normal now.  Again, I am not sure if patient going to have to have her surgery postponed.  I am sure that Dr. Ninfa Linden of Orthopedic Surgery will make that decision.  Again I do not see any problems  with her being off Eliquis for a couple days before surgery and then get back on Eliquis a day or so after surgery.  We will plan  to get her back to see [place] in another 3 months.  By that, she should have had a surgery regardless of whether it is going to be this Friday or postponed a little bit.   Volanda Napoleon, MD 12/6/202210:44 AM

## 2021-06-14 NOTE — Telephone Encounter (Signed)
Spoke with pt regarding appointment with Dr. Gwenlyn Found. Office visit scheduled. Instructions given to our office. Pt verbalizes understanding.

## 2021-06-15 ENCOUNTER — Ambulatory Visit (HOSPITAL_COMMUNITY): Payer: Medicare Other | Attending: Cardiology

## 2021-06-15 ENCOUNTER — Ambulatory Visit (INDEPENDENT_AMBULATORY_CARE_PROVIDER_SITE_OTHER)
Admission: RE | Admit: 2021-06-15 | Discharge: 2021-06-15 | Disposition: A | Discharge status: Home / Self Care | Payer: Self-pay | Source: Ambulatory Visit | Attending: Cardiovascular Disease | Admitting: Cardiovascular Disease

## 2021-06-15 ENCOUNTER — Ambulatory Visit (INDEPENDENT_AMBULATORY_CARE_PROVIDER_SITE_OTHER): Payer: Medicare Other | Admitting: Cardiovascular Disease

## 2021-06-15 ENCOUNTER — Other Ambulatory Visit: Payer: Self-pay | Admitting: Orthopaedic Surgery

## 2021-06-15 ENCOUNTER — Encounter: Payer: Self-pay | Admitting: Cardiovascular Disease

## 2021-06-15 VITALS — BP 126/80 | HR 79 | Ht 69.0 in | Wt 295.0 lb

## 2021-06-15 DIAGNOSIS — Q2112 Patent foramen ovale: Secondary | ICD-10-CM | POA: Diagnosis not present

## 2021-06-15 DIAGNOSIS — I253 Aneurysm of heart: Secondary | ICD-10-CM

## 2021-06-15 DIAGNOSIS — E782 Mixed hyperlipidemia: Secondary | ICD-10-CM

## 2021-06-15 DIAGNOSIS — Z01818 Encounter for other preprocedural examination: Secondary | ICD-10-CM | POA: Insufficient documentation

## 2021-06-15 DIAGNOSIS — Z01810 Encounter for preprocedural cardiovascular examination: Secondary | ICD-10-CM

## 2021-06-15 DIAGNOSIS — R9431 Abnormal electrocardiogram [ECG] [EKG]: Secondary | ICD-10-CM

## 2021-06-15 DIAGNOSIS — I4510 Unspecified right bundle-branch block: Secondary | ICD-10-CM

## 2021-06-15 DIAGNOSIS — E785 Hyperlipidemia, unspecified: Secondary | ICD-10-CM | POA: Insufficient documentation

## 2021-06-15 LAB — THYROID PANEL WITH TSH
Free Thyroxine Index: 2.6 (ref 1.2–4.9)
T3 Uptake Ratio: 27 % (ref 24–39)
T4, Total: 9.7 ug/dL (ref 4.5–12.0)
TSH: 1.91 u[IU]/mL (ref 0.450–4.500)

## 2021-06-15 LAB — ECHOCARDIOGRAM COMPLETE
Area-P 1/2: 3.78 cm2
Height: 69 in
S' Lateral: 2.6 cm
Weight: 4720 oz

## 2021-06-15 LAB — SARS CORONAVIRUS 2 (TAT 6-24 HRS): SARS Coronavirus 2: NEGATIVE

## 2021-06-15 NOTE — Patient Instructions (Signed)
Medication Instructions:  Your physician recommends that you continue on your current medications as directed. Please refer to the Current Medication list given to you today.  *If you need a refill on your cardiac medications before your next appointment, please call your pharmacy*   Testing/Procedures: Your physician has requested that you have an echocardiogram. Echocardiography is a painless test that uses sound waves to create images of your heart. It provides your doctor with information about the size and shape of your heart and how well your heart's chambers and valves are working. This procedure takes approximately one hour. There are no restrictions for this procedure.  Dr. Gwenlyn Found has ordered a CT coronary calcium score. This test is done at [address]. This is $99 out of pocket.   Coronary CalciumScan A coronary calcium scan is an imaging test used to look for deposits of calcium and other fatty materials (plaques) in the inner lining of the blood vessels of the heart (coronary arteries). These deposits of calcium and plaques can partly clog and narrow the coronary arteries without producing any symptoms or warning signs. This puts a person at risk for a heart attack. This test can detect these deposits before symptoms develop. Tell a health care provider about: Any allergies you have. All medicines you are taking, including vitamins, herbs, eye drops, creams, and over-the-counter medicines. Any problems you or family members have had with anesthetic medicines. Any blood disorders you have. Any surgeries you have had. Any medical conditions you have. Whether you are pregnant or may be pregnant. What are the risks? Generally, this is a safe procedure. However, problems may occur, including: Harm to a pregnant woman and her unborn baby. This test involves the use of radiation. Radiation exposure can be dangerous to a pregnant woman and her unborn baby. If you are  pregnant, you generally should not have this procedure done. Slight increase in the risk of cancer. This is because of the radiation involved in the test. What happens before the procedure? No preparation is needed for this procedure. What happens during the procedure? You will undress and remove any jewelry around your neck or chest. You will put on a hospital gown. Sticky electrodes will be placed on your chest. The electrodes will be connected to an electrocardiogram (ECG) machine to record a tracing of the electrical activity of your heart. A CT scanner will take pictures of your heart. During this time, you will be asked to lie still and hold your breath for 2-3 seconds while a picture of your heart is being taken. The procedure may vary among health care providers and hospitals. What happens after the procedure? You can get dressed. You can return to your normal activities. It is up to you to get the results of your test. Ask your health care provider, or the department that is doing the test, when your results will be ready. Summary A coronary calcium scan is an imaging test used to look for deposits of calcium and other fatty materials (plaques) in the inner lining of the blood vessels of the heart (coronary arteries). Generally, this is a safe procedure. Tell your health care provider if you are pregnant or may be pregnant. No preparation is needed for this procedure. A CT scanner will take pictures of your heart. You can return to your normal activities after the scan is done. This information is not intended to replace advice given to you by your health care provider. Make sure  you discuss any questions you have with your health care provider. Document Released: 12/23/2007 Document Revised: 05/15/2016 Document Reviewed: 05/15/2016 Elsevier Interactive Patient Education  2017 [place]: At [facility], you and your health needs are our priority.  As part of  our continuing mission to provide you with exceptional heart care, we have created designated Provider Care Teams.  These Care Teams include your primary Cardiologist (physician) and Advanced Practice Providers (APPs -  Physician Assistants and Nurse Practitioners) who all work together to provide you with the care you need, when you need it.  We recommend signing up for the patient portal called "MyChart".  Sign up information is provided on this After Visit Summary.  MyChart is used to connect with patients for Virtual Visits (Telemedicine).  Patients are able to view lab/test results, encounter notes, upcoming appointments, etc.  Non-urgent messages can be sent to your provider as well.   To learn more about what you can do with MyChart, go to [email or web address].    Your next appointment:   We will see you on an as needed basis.  Provider:   Quay Burow, MD

## 2021-06-15 NOTE — Progress Notes (Signed)
06/15/2021 Leslie Knapp   05/21/1951  [id number]  Primary Physician Hilts, Legrand Como, MD Primary Cardiologist: Lorretta Harp MD Lupe Carney, [place]  HPI:  Leslie Knapp is a 70 y.o. morbidly overweight widowed Caucasian female with no children who is retired Dance movement psychotherapist who was referred to me by Dr. Junius Roads for cardiac clearance before scheduled right total hip replacement by Dr. Zollie Beckers this coming Friday.  Her only risk factor is untreated hyperlipidemia.  There is no family history of heart disease.  She is never had a heart attack or stroke.  She had a saddle pulmonary embolus in 2020 and underwent PFO closure at [facility] with a 30 mm Amplatzer closure device 03/24/2019.  She does remain on Eliquis oral anticoagulation.  She has had a thrombophilia work-up and is followed by Dr. Marin Olp for this.  On preoperative evaluation she was found to have a new right bundle branch block.  Her last EKG in our system 03/29/2020 revealed normal sinus rhythm with normal conduction.  She is completely asymptomatic.   Current Meds  Medication Sig   apixaban (ELIQUIS) 2.5 MG TABS tablet Take 1 tablet (2.5 mg total) by mouth 2 (two) times daily.   Cholecalciferol 25 MCG/0.03ML LIQD Take 1,000 Units by mouth once a week.   Digestive Enzymes (ENZYME DIGEST) CAPS Take 1 capsule by mouth daily as needed (heartburn).   Trace Min CaCrCuFeKMgMnPSeZn (MINERALS PO) Take 1 capsule by mouth daily.     Allergies  Allergen Reactions   Celebrex [Celecoxib] Swelling and Rash   Lactose Intolerance (Gi) Other (See Comments)    Social History   Socioeconomic History   Marital status: Widowed    Spouse name: Not on file   Number of children: Not on file   Years of education: Not on file   Highest education level: Not on file  Occupational History   Occupation: retired  Tobacco Use   Smoking status: Never   Smokeless tobacco: Never  Vaping Use   Vaping Use:  Never used  Substance and Sexual Activity   Alcohol use: No    Alcohol/week: 0.0 standard drinks   Drug use: No   Sexual activity: Not Currently  Other Topics Concern   Not on file  Social History Narrative   Not on file   Social Determinants of Health   Financial Resource Strain: Low Risk    Difficulty of Paying Living Expenses: Not hard at all  Food Insecurity: No Food Insecurity   Worried About Charity fundraiser in the Last Year: Never true   [place] in the Last Year: Never true  Transportation Needs: No Transportation Needs   Lack of Transportation (Medical): No   Lack of Transportation (Non-Medical): No  Physical Activity: Insufficiently Active   Days of Exercise per Week: 3 days   Minutes of Exercise per Session: 30 min  Stress: No Stress Concern Present   Feeling of Stress : Not at all  Social Connections: Socially Isolated   Frequency of Communication with Friends and Family: More than three times a week   Frequency of Social Gatherings with Friends and Family: More than three times a week   Attends Religious Services: Never   Marine scientist or Organizations: No   Attends Archivist Meetings: Never   Marital Status: Widowed  Intimate Partner Violence: Not At Risk   Fear of Current or Ex-Partner: No   Emotionally Abused: No  Physically Abused: No   Sexually Abused: No     Review of Systems: General: negative for chills, fever, night sweats or weight changes.  Cardiovascular: negative for chest pain, dyspnea on exertion, edema, orthopnea, palpitations, paroxysmal nocturnal dyspnea or shortness of breath Dermatological: negative for rash Respiratory: negative for cough or wheezing Urologic: negative for hematuria Abdominal: negative for nausea, vomiting, diarrhea, bright red blood per rectum, melena, or hematemesis Neurologic: negative for visual changes, syncope, or dizziness All other systems reviewed and are otherwise negative  except as noted above.    Blood pressure 126/80, pulse 79, height 5\' 9"  (1.753 m), weight 295 lb (133.8 kg), SpO2 100 %.  General appearance: alert and no distress Neck: no adenopathy, no carotid bruit, no JVD, supple, symmetrical, trachea midline, and thyroid not enlarged, symmetric, no tenderness/mass/nodules Lungs: clear to auscultation bilaterally Heart: regular rate and rhythm, S1, S2 normal, no murmur, click, rub or gallop Extremities: extremities normal, atraumatic, no cyanosis or edema Pulses: 2+ and symmetric Skin: Skin color, texture, turgor normal. No rashes or lesions Neurologic: Grossly normal  EKG sinus rhythm at 79 with right bundle branch block.  I personally reviewed this EKG.  ASSESSMENT AND PLAN:   Preoperative clearance Loose was referred by Dr. Junius Roads for preoperative clearance before a right total hip replacement scheduled to be performed by Dr. Zollie Beckers this Friday.  She was found to have a right bundle branch block since her previous EKG in our system a year ago.  She has no cardiac risk factors and she is otherwise healthy other than obesity.  She has no cardiac risk factors.  I am going to get a 2D echo and a coronary calcium score to risk stratify her.  If these are normal and/or low risk she is cleared for surgery.  Hyperlipidemia History of hyperlipidemia not on statin therapy with lipid profile performed 01/25/2021 revealing total cholesterol 251, LDL 151 HDL 60.  I will leave it to her primary care physician, Dr. Junius Roads, to address this and potentially begin her on statin therapy.  Right bundle branch block New since prior EKG 1 year ago     Lorretta Harp MD [facility] 06/15/2021 9:50 AM

## 2021-06-15 NOTE — Assessment & Plan Note (Signed)
History of hyperlipidemia not on statin therapy with lipid profile performed 01/25/2021 revealing total cholesterol 251, LDL 151 HDL 60.  I will leave it to her primary care physician, Dr. Junius Roads, to address this and potentially begin her on statin therapy.

## 2021-06-15 NOTE — Assessment & Plan Note (Signed)
Leslie Knapp was referred by Dr. Junius Roads for preoperative clearance before a right total hip replacement scheduled to be performed by Dr. Zollie Beckers this Friday.  She was found to have a right bundle branch block since her previous EKG in our system a year ago.  She has no cardiac risk factors and she is otherwise healthy other than obesity.  She has no cardiac risk factors.  I am going to get a 2D echo and a coronary calcium score to risk stratify her.  If these are normal and/or low risk she is cleared for surgery.

## 2021-06-15 NOTE — Assessment & Plan Note (Signed)
New since prior EKG 1 year ago

## 2021-06-16 ENCOUNTER — Encounter: Payer: Self-pay | Admitting: Hematology & Oncology

## 2021-06-16 DIAGNOSIS — M9901 Segmental and somatic dysfunction of cervical region: Secondary | ICD-10-CM | POA: Diagnosis not present

## 2021-06-16 DIAGNOSIS — M9903 Segmental and somatic dysfunction of lumbar region: Secondary | ICD-10-CM | POA: Diagnosis not present

## 2021-06-16 DIAGNOSIS — M542 Cervicalgia: Secondary | ICD-10-CM | POA: Diagnosis not present

## 2021-06-16 DIAGNOSIS — M62838 Other muscle spasm: Secondary | ICD-10-CM | POA: Diagnosis not present

## 2021-06-16 DIAGNOSIS — M1611 Unilateral primary osteoarthritis, right hip: Secondary | ICD-10-CM

## 2021-06-16 DIAGNOSIS — M546 Pain in thoracic spine: Secondary | ICD-10-CM | POA: Diagnosis not present

## 2021-06-16 DIAGNOSIS — M9902 Segmental and somatic dysfunction of thoracic region: Secondary | ICD-10-CM | POA: Diagnosis not present

## 2021-06-16 DIAGNOSIS — M5417 Radiculopathy, lumbosacral region: Secondary | ICD-10-CM | POA: Diagnosis not present

## 2021-06-16 NOTE — H&P (Signed)
TOTAL HIP ADMISSION H&P  Patient is admitted for right total hip arthroplasty.  Subjective:  Chief Complaint: right hip pain  HPI: Leslie Knapp, 70 y.o. female, has a history of pain and functional disability in the right hip(s) due to arthritis and patient has failed non-surgical conservative treatments for greater than 12 weeks to include corticosteriod injections, flexibility and strengthening excercises, use of assistive devices, weight reduction as appropriate, and activity modification.  Onset of symptoms was gradual starting 2 years ago with gradually worsening course since that time.The patient noted no past surgery on the right hip(s).  Patient currently rates pain in the right hip at 10 out of 10 with activity. Patient has night pain, worsening of pain with activity and weight bearing, trendelenberg gait, pain that interfers with activities of daily living, and pain with passive range of motion. Patient has evidence of subchondral cysts, subchondral sclerosis, periarticular osteophytes, and joint space narrowing by imaging studies. This condition presents safety issues increasing the risk of falls.  There is no current active infection.  Patient Active Problem List   Diagnosis Date Noted   Unilateral primary osteoarthritis, right hip 06/16/2021   Right bundle branch block 06/15/2021   Preoperative clearance 06/15/2021   Hyperlipidemia 06/15/2021   Posterior vitreous detachment of left eye 09/13/2020   Nuclear sclerotic cataract of both eyes 09/13/2020   Thickened endometrium 04/07/2020   Vaginal discharge 04/07/2020   S/P insertion of IVC (inferior vena caval) filter 06/17/2019   PFO with atrial septal aneurysm 03/21/2019   Aortic atherosclerosis ([place]) 03/17/2019   Brachial artery occlusion, right ([place]) 03/14/2019   Abdominal pain 03/08/2019   Pure hypercholesterolemia 09/25/2017   Morbid obesity ([place]) 06/07/2017   Encounter for Medicare annual wellness exam 06/07/2017    Vitamin D deficiency 06/07/2017   IBS (irritable bowel syndrome) 08/29/2016   Migraine 08/29/2016   Disorder of musculoskeletal system 03/23/2015   Eye exam abnormal 03/23/2015   NASH (nonalcoholic steatohepatitis) 10/19/2011   Past Medical History:  Diagnosis Date   Acute saddle pulmonary embolism ([place]) 52/7782   Complication of anesthesia    wants neck supported during surgery due to hx of migraine caused by concussion   Concussion 2010, 2000 and 1998   periodiotic migraine and occ dizzines   COVID-19 09/2019   sx covid pneumonia,fever, chills body aches, took monoclonal antibody tx done symptoms resolved after 6 weeks   CTS (carpal tunnel syndrome)    Fatty liver disease, nonalcoholic    In [facility] Study liver biopsy 2010 did not show fatty liver   Headache    History of blood transfusion 1975   after knee surgery   IBS (irritable bowel syndrome)    C/D   Localized swelling of both lower legs    go down with propping up of legs   Morbid obesity ([facility])    Osteoarthritis    oa   Pneumonia 09/2019   PONV (postoperative nausea and vomiting)    watch neck position needs support or gets n/v   Thyroid nodule    sees ent dr Radene Journey for q year   Uterine polyp     Past Surgical History:  Procedure Laterality Date   CARDIAC CATHETERIZATION  03/21/2019   with pfo closure   HYSTEROSCOPY WITH D & C N/A 04/06/2020   Procedure: DILATATION AND CURETTAGE /HYSTEROSCOPY;  Surgeon: Lavonia Drafts, MD;  Location: [facility];  Service: Gynecology;  Laterality: N/A;   IVC FILTER INSERTION  07/07/2019   KNEE SURGERY  1975   Left Patella Tendon Replacement   LIVER BIOPSY  2010   PATENT FORAMEN OVALE(PFO) CLOSURE  03/21/2019   right arm blood clot removal     TONSILLECTOMY  1963   TOTAL KNEE ARTHROPLASTY  12/2003   Left   TOTAL KNEE ARTHROPLASTY  05/2004   Right   UTERINE POLYP REMOVAL  2010    No current facility-administered medications for this encounter.    Current Outpatient Medications  Medication Sig Dispense Refill Last Dose   apixaban (ELIQUIS) 2.5 MG TABS tablet Take 1 tablet (2.5 mg total) by mouth 2 (two) times daily. 60 tablet 12    Cholecalciferol 25 MCG/0.03ML LIQD Take 1,000 Units by mouth once a week.      Digestive Enzymes (ENZYME DIGEST) CAPS Take 1 capsule by mouth daily as needed (heartburn).      Trace Min [id number] (MINERALS PO) Take 1 capsule by mouth daily.      Allergies  Allergen Reactions   Celebrex [Celecoxib] Swelling and Rash   Lactose Intolerance (Gi) Other (See Comments)    Social History   Tobacco Use   Smoking status: Never   Smokeless tobacco: Never  Substance Use Topics   Alcohol use: No    Alcohol/week: 0.0 standard drinks    Family History  Problem Relation Age of Onset   Stroke Mother 15       Deceased   Hypertension Mother    GI problems Mother    Arthritis Mother    Dementia Mother    Heart defect Father 68       Deceased   Parkinson's disease Father    Varicose Veins Father    Stroke Maternal Grandfather    Arthritis Maternal Grandfather    Kidney failure Maternal Grandmother    Gallbladder disease Maternal Grandmother    Diverticulitis Sister        #1   Arthritis Sister    Healthy Sister        #2     Review of Systems  Musculoskeletal:  Positive for gait problem.  All other systems reviewed and are negative.  Objective:  Physical Exam Vitals reviewed.  Constitutional:      Appearance: Normal appearance.  HENT:     Head: Normocephalic and atraumatic.  Eyes:     Extraocular Movements: Extraocular movements intact.  Cardiovascular:     Rate and Rhythm: Normal rate.     Pulses: Normal pulses.  Pulmonary:     Effort: Pulmonary effort is normal.     Breath sounds: Normal breath sounds.  Abdominal:     General: Abdomen is flat.     Palpations: Abdomen is soft.  Musculoskeletal:     Cervical back: Normal range of motion and neck supple.     Right hip:  Tenderness and bony tenderness present. Decreased range of motion. Decreased strength.  Neurological:     Mental Status: She is alert and oriented to person, place, and time.  Psychiatric:        Behavior: Behavior normal.    Vital signs in last 24 hours:    Labs:   Estimated body mass index is 43.56 kg/m as calculated from the following:   Height as of 06/15/21: 5\' 9"  (1.753 m).   Weight as of 06/15/21: 133.8 kg.   Imaging Review Plain radiographs demonstrate severe degenerative joint disease of the right hip(s). The bone quality appears to be good for age and reported activity level.      Assessment/Plan:  End stage arthritis, right hip(s)  The patient history, physical examination, clinical judgement of the provider and imaging studies are consistent with end stage degenerative joint disease of the right hip(s) and total hip arthroplasty is deemed medically necessary. The treatment options including medical management, injection therapy, arthroscopy and arthroplasty were discussed at length. The risks and benefits of total hip arthroplasty were presented and reviewed. The risks due to aseptic loosening, infection, stiffness, dislocation/subluxation,  thromboembolic complications and other imponderables were discussed.  The patient acknowledged the explanation, agreed to proceed with the plan and consent was signed. Patient is being admitted for inpatient treatment for surgery, pain control, PT, OT, prophylactic antibiotics, VTE prophylaxis, progressive ambulation and ADL's and discharge planning.The patient is planning to be discharged home with home health services

## 2021-06-17 ENCOUNTER — Observation Stay (HOSPITAL_COMMUNITY): Payer: Medicare Other

## 2021-06-17 ENCOUNTER — Encounter (HOSPITAL_COMMUNITY): Payer: Self-pay | Admitting: Orthopaedic Surgery

## 2021-06-17 ENCOUNTER — Other Ambulatory Visit: Payer: Self-pay

## 2021-06-17 ENCOUNTER — Ambulatory Visit (HOSPITAL_COMMUNITY): Payer: Medicare Other | Admitting: Physician Assistant

## 2021-06-17 ENCOUNTER — Observation Stay (HOSPITAL_COMMUNITY)
Admission: RE | Admit: 2021-06-17 | Discharge: 2021-06-20 | Disposition: A | Discharge status: Home / Self Care | Payer: Medicare Other | Source: Ambulatory Visit | Attending: Orthopaedic Surgery | Admitting: Orthopaedic Surgery

## 2021-06-17 ENCOUNTER — Encounter (HOSPITAL_COMMUNITY)
Admission: RE | Disposition: A | Discharge status: Home / Self Care | Payer: Self-pay | Source: Ambulatory Visit | Attending: Orthopaedic Surgery

## 2021-06-17 ENCOUNTER — Ambulatory Visit (HOSPITAL_COMMUNITY): Payer: Medicare Other

## 2021-06-17 DIAGNOSIS — M1612 Unilateral primary osteoarthritis, left hip: Secondary | ICD-10-CM | POA: Diagnosis not present

## 2021-06-17 DIAGNOSIS — Z96653 Presence of artificial knee joint, bilateral: Secondary | ICD-10-CM | POA: Diagnosis not present

## 2021-06-17 DIAGNOSIS — Z6841 Body Mass Index (BMI) 40.0 and over, adult: Secondary | ICD-10-CM | POA: Insufficient documentation

## 2021-06-17 DIAGNOSIS — Z79899 Other long term (current) drug therapy: Secondary | ICD-10-CM | POA: Diagnosis not present

## 2021-06-17 DIAGNOSIS — M1611 Unilateral primary osteoarthritis, right hip: Principal | ICD-10-CM | POA: Insufficient documentation

## 2021-06-17 DIAGNOSIS — Z7901 Long term (current) use of anticoagulants: Secondary | ICD-10-CM | POA: Insufficient documentation

## 2021-06-17 DIAGNOSIS — Z8616 Personal history of COVID-19: Secondary | ICD-10-CM | POA: Insufficient documentation

## 2021-06-17 DIAGNOSIS — Z419 Encounter for procedure for purposes other than remedying health state, unspecified: Secondary | ICD-10-CM

## 2021-06-17 DIAGNOSIS — Z471 Aftercare following joint replacement surgery: Secondary | ICD-10-CM | POA: Diagnosis not present

## 2021-06-17 DIAGNOSIS — Z96641 Presence of right artificial hip joint: Secondary | ICD-10-CM

## 2021-06-17 HISTORY — PX: TOTAL HIP ARTHROPLASTY: SHX124

## 2021-06-17 LAB — TYPE AND SCREEN
ABO/RH(D): O POS
Antibody Screen: NEGATIVE

## 2021-06-17 LAB — ABO/RH: ABO/RH(D): O POS

## 2021-06-17 SURGERY — ARTHROPLASTY, HIP, TOTAL, ANTERIOR APPROACH
Anesthesia: Spinal | Site: Hip | Laterality: Right

## 2021-06-17 MED ORDER — PANTOPRAZOLE SODIUM 40 MG PO TBEC
40.0000 mg | DELAYED_RELEASE_TABLET | Freq: Every day | ORAL | Status: DC
  Administered 2021-06-17 – 2021-06-20 (×2): 40 mg via ORAL
  Filled 2021-06-17 (×3): qty 1

## 2021-06-17 MED ORDER — DIPHENHYDRAMINE HCL 12.5 MG/5ML PO ELIX: 12.5000 mg | ORAL_SOLUTION | ORAL | Status: DC | PRN

## 2021-06-17 MED ORDER — DEXAMETHASONE SODIUM PHOSPHATE 10 MG/ML IJ SOLN
INTRAMUSCULAR | Status: DC | PRN
  Administered 2021-06-17: 5 mg via INTRAVENOUS

## 2021-06-17 MED ORDER — POLYETHYLENE GLYCOL 3350 17 G PO PACK
17.0000 g | PACK | Freq: Every day | ORAL | Status: DC | PRN
  Filled 2021-06-17: qty 1

## 2021-06-17 MED ORDER — SODIUM CHLORIDE 0.9 % IR SOLN
Status: DC | PRN
  Administered 2021-06-17: 1000 mL

## 2021-06-17 MED ORDER — ACETAMINOPHEN 500 MG PO TABS: 1000.0000 mg | ORAL_TABLET | Freq: Once | ORAL | Status: AC

## 2021-06-17 MED ORDER — DOCUSATE SODIUM 100 MG PO CAPS
100.0000 mg | ORAL_CAPSULE | Freq: Two times a day (BID) | ORAL | Status: DC
  Administered 2021-06-17 – 2021-06-20 (×5): 100 mg via ORAL
  Filled 2021-06-17 (×5): qty 1

## 2021-06-17 MED ORDER — BUPIVACAINE IN DEXTROSE 0.75-8.25 % IT SOLN
INTRATHECAL | Status: DC | PRN
  Administered 2021-06-17: 1.8 mL via INTRATHECAL

## 2021-06-17 MED ORDER — METHOCARBAMOL 500 MG IVPB - SIMPLE MED
INTRAVENOUS | Status: AC
  Filled 2021-06-17: qty 50

## 2021-06-17 MED ORDER — PHENOL 1.4 % MT LIQD: 1.0000 | OROMUCOSAL | Status: DC | PRN

## 2021-06-17 MED ORDER — MENTHOL 3 MG MT LOZG: 1.0000 | LOZENGE | OROMUCOSAL | Status: DC | PRN

## 2021-06-17 MED ORDER — PHENYLEPHRINE HCL (PRESSORS) 10 MG/ML IV SOLN
INTRAVENOUS | Status: AC
  Filled 2021-06-17: qty 1

## 2021-06-17 MED ORDER — OXYCODONE HCL 5 MG PO TABS: 5.0000 mg | ORAL_TABLET | Freq: Once | ORAL | Status: DC | PRN

## 2021-06-17 MED ORDER — METOCLOPRAMIDE HCL 5 MG PO TABS
5.0000 mg | ORAL_TABLET | Freq: Three times a day (TID) | ORAL | Status: DC | PRN
  Filled 2021-06-17: qty 2

## 2021-06-17 MED ORDER — PHENYLEPHRINE HCL (PRESSORS) 10 MG/ML IV SOLN
INTRAVENOUS | Status: DC | PRN
  Administered 2021-06-17 (×2): 80 ug via INTRAVENOUS
  Administered 2021-06-17: 120 ug via INTRAVENOUS
  Administered 2021-06-17 (×4): 80 ug via INTRAVENOUS

## 2021-06-17 MED ORDER — ORAL CARE MOUTH RINSE: 15.0000 mL | Freq: Once | OROMUCOSAL | Status: AC

## 2021-06-17 MED ORDER — HYDROMORPHONE HCL 1 MG/ML IJ SOLN
0.5000 mg | INTRAMUSCULAR | Status: DC | PRN
  Administered 2021-06-17: 0.5 mg via INTRAVENOUS

## 2021-06-17 MED ORDER — ONDANSETRON HCL 4 MG PO TABS
4.0000 mg | ORAL_TABLET | Freq: Four times a day (QID) | ORAL | Status: DC | PRN
  Administered 2021-06-20: 4 mg via ORAL
  Filled 2021-06-17 (×2): qty 1

## 2021-06-17 MED ORDER — EPHEDRINE SULFATE 50 MG/ML IJ SOLN
INTRAMUSCULAR | Status: DC | PRN
  Administered 2021-06-17: 10 mg via INTRAVENOUS

## 2021-06-17 MED ORDER — 0.9 % SODIUM CHLORIDE (POUR BTL) OPTIME
TOPICAL | Status: DC | PRN
  Administered 2021-06-17: 1000 mL

## 2021-06-17 MED ORDER — FENTANYL CITRATE (PF) 100 MCG/2ML IJ SOLN
INTRAMUSCULAR | Status: AC
  Filled 2021-06-17: qty 2

## 2021-06-17 MED ORDER — SODIUM CHLORIDE 0.9 % IV SOLN: INTRAVENOUS | Status: DC

## 2021-06-17 MED ORDER — LACTATED RINGERS IV SOLN: INTRAVENOUS | Status: DC

## 2021-06-17 MED ORDER — APIXABAN 2.5 MG PO TABS
2.5000 mg | ORAL_TABLET | Freq: Two times a day (BID) | ORAL | Status: DC
  Administered 2021-06-18 – 2021-06-20 (×5): 2.5 mg via ORAL
  Filled 2021-06-17 (×5): qty 1

## 2021-06-17 MED ORDER — METHOCARBAMOL 500 MG PO TABS
500.0000 mg | ORAL_TABLET | Freq: Four times a day (QID) | ORAL | Status: DC | PRN
  Administered 2021-06-17 – 2021-06-19 (×5): 500 mg via ORAL
  Filled 2021-06-17 (×7): qty 1

## 2021-06-17 MED ORDER — ACETAMINOPHEN 325 MG PO TABS: 325.0000 mg | ORAL_TABLET | Freq: Four times a day (QID) | ORAL | Status: DC | PRN

## 2021-06-17 MED ORDER — ALUM & MAG HYDROXIDE-SIMETH 200-200-20 MG/5ML PO SUSP: 30.0000 mL | ORAL | Status: DC | PRN

## 2021-06-17 MED ORDER — HYDROMORPHONE HCL 1 MG/ML IJ SOLN
INTRAMUSCULAR | Status: AC
  Filled 2021-06-17: qty 1

## 2021-06-17 MED ORDER — HYDROCODONE-ACETAMINOPHEN 5-325 MG PO TABS
1.0000 | ORAL_TABLET | ORAL | Status: DC | PRN
Start: 2021-06-17 — End: 2021-06-20
  Administered 2021-06-17 – 2021-06-20 (×9): 2 via ORAL
  Filled 2021-06-17 (×11): qty 2

## 2021-06-17 MED ORDER — CEFAZOLIN SODIUM-DEXTROSE 2-4 GM/100ML-% IV SOLN
2.0000 g | Freq: Four times a day (QID) | INTRAVENOUS | Status: AC
  Administered 2021-06-17 (×2): 2 g via INTRAVENOUS
  Filled 2021-06-17 (×2): qty 100

## 2021-06-17 MED ORDER — POVIDONE-IODINE 10 % EX SWAB
2.0000 "application " | Freq: Once | CUTANEOUS | Status: AC
  Administered 2021-06-17: 2 via TOPICAL

## 2021-06-17 MED ORDER — OXYCODONE HCL 5 MG/5ML PO SOLN: 5.0000 mg | Freq: Once | ORAL | Status: DC | PRN

## 2021-06-17 MED ORDER — PROPOFOL 500 MG/50ML IV EMUL
INTRAVENOUS | Status: DC | PRN
  Administered 2021-06-17: 75 ug/kg/min via INTRAVENOUS

## 2021-06-17 MED ORDER — METOCLOPRAMIDE HCL 5 MG/ML IJ SOLN: 5.0000 mg | Freq: Three times a day (TID) | INTRAMUSCULAR | Status: DC | PRN

## 2021-06-17 MED ORDER — CHLORHEXIDINE GLUCONATE 0.12 % MT SOLN
15.0000 mL | Freq: Once | OROMUCOSAL | Status: AC
  Administered 2021-06-17: 15 mL via OROMUCOSAL

## 2021-06-17 MED ORDER — OXYCODONE HCL 5 MG PO TABS: 5.0000 mg | ORAL_TABLET | ORAL | Status: DC | PRN

## 2021-06-17 MED ORDER — PHENYLEPHRINE HCL-NACL 20-0.9 MG/250ML-% IV SOLN
INTRAVENOUS | Status: DC | PRN
  Administered 2021-06-17: 25 ug/min via INTRAVENOUS

## 2021-06-17 MED ORDER — METHOCARBAMOL 500 MG IVPB - SIMPLE MED
500.0000 mg | Freq: Four times a day (QID) | INTRAVENOUS | Status: DC | PRN
  Administered 2021-06-17: 500 mg via INTRAVENOUS
  Filled 2021-06-17: qty 50

## 2021-06-17 MED ORDER — ONDANSETRON HCL 4 MG/2ML IJ SOLN
INTRAMUSCULAR | Status: DC | PRN
  Administered 2021-06-17: 4 mg via INTRAVENOUS

## 2021-06-17 MED ORDER — TRANEXAMIC ACID-NACL 1000-0.7 MG/100ML-% IV SOLN
1000.0000 mg | INTRAVENOUS | Status: AC
  Administered 2021-06-17: 1000 mg via INTRAVENOUS
  Filled 2021-06-17: qty 100

## 2021-06-17 MED ORDER — FENTANYL CITRATE (PF) 100 MCG/2ML IJ SOLN
INTRAMUSCULAR | Status: DC | PRN
  Administered 2021-06-17: 100 ug via INTRAVENOUS

## 2021-06-17 MED ORDER — CEFAZOLIN IN SODIUM CHLORIDE 3-0.9 GM/100ML-% IV SOLN
3.0000 g | INTRAVENOUS | Status: AC
  Administered 2021-06-17: 3 g via INTRAVENOUS
  Filled 2021-06-17: qty 100

## 2021-06-17 MED ORDER — STERILE WATER FOR IRRIGATION IR SOLN
Status: DC | PRN
  Administered 2021-06-17: 2000 mL

## 2021-06-17 MED ORDER — ONDANSETRON HCL 4 MG/2ML IJ SOLN
4.0000 mg | Freq: Four times a day (QID) | INTRAMUSCULAR | Status: DC | PRN
  Administered 2021-06-18 – 2021-06-19 (×2): 4 mg via INTRAVENOUS
  Filled 2021-06-17 (×2): qty 2

## 2021-06-17 MED ORDER — OXYCODONE HCL 5 MG PO TABS: 10.0000 mg | ORAL_TABLET | ORAL | Status: DC | PRN

## 2021-06-17 MED ORDER — FENTANYL CITRATE PF 50 MCG/ML IJ SOSY: 25.0000 ug | PREFILLED_SYRINGE | INTRAMUSCULAR | Status: DC | PRN

## 2021-06-17 SURGICAL SUPPLY — 41 items
BAG COUNTER SPONGE SURGICOUNT (BAG) ×2 IMPLANT
BAG ZIPLOCK 12X15 (MISCELLANEOUS) ×2 IMPLANT
BENZOIN TINCTURE PRP APPL 2/3 (GAUZE/BANDAGES/DRESSINGS) IMPLANT
BLADE SAW SGTL 18X1.27X75 (BLADE) ×2 IMPLANT
COVER PERINEAL POST (MISCELLANEOUS) ×2 IMPLANT
COVER SURGICAL LIGHT HANDLE (MISCELLANEOUS) ×2 IMPLANT
DRAPE FOOT SWITCH (DRAPES) ×2 IMPLANT
DRAPE STERI IOBAN 125X83 (DRAPES) ×2 IMPLANT
DRAPE U-SHAPE 47X51 STRL (DRAPES) ×4 IMPLANT
DRSG AQUACEL AG ADV 3.5X10 (GAUZE/BANDAGES/DRESSINGS) ×2 IMPLANT
DURAPREP 26ML APPLICATOR (WOUND CARE) ×2 IMPLANT
ELECT REM PT RETURN 15FT ADLT (MISCELLANEOUS) ×2 IMPLANT
GAUZE XEROFORM 1X8 LF (GAUZE/BANDAGES/DRESSINGS) ×2 IMPLANT
GLOVE SRG 8 PF TXTR STRL LF DI (GLOVE) ×2 IMPLANT
GLOVE SURG ENC MOIS LTX SZ7.5 (GLOVE) ×2 IMPLANT
GLOVE SURG NEOPR MICRO LF SZ8 (GLOVE) ×6 IMPLANT
GLOVE SURG POLY ORTHO LF SZ7 (GLOVE) ×6 IMPLANT
GLOVE SURG UNDER POLY LF SZ8 (GLOVE) ×4
GOWN STRL REUS W/TWL XL LVL3 (GOWN DISPOSABLE) ×6 IMPLANT
HANDPIECE INTERPULSE COAX TIP (DISPOSABLE) ×2
HEAD M SROM 36MM PLUS 1.5 (Hips) ×1 IMPLANT
HOLDER FOLEY CATH W/STRAP (MISCELLANEOUS) ×2 IMPLANT
KIT TURNOVER KIT A (KITS) ×2 IMPLANT
LINER NEUTRAL 52X36MM PLUS 4 (Liner) ×2 IMPLANT
PACK ANTERIOR HIP CUSTOM (KITS) ×2 IMPLANT
PIN SECTOR W/GRIP ACE CUP 52MM (Hips) ×2 IMPLANT
SET HNDPC FAN SPRY TIP SCT (DISPOSABLE) ×1 IMPLANT
SPONGE T-LAP 18X18 ~~LOC~~+RFID (SPONGE) ×4 IMPLANT
SROM M HEAD 36MM PLUS 1.5 (Hips) ×2 IMPLANT
STAPLER VISISTAT 35W (STAPLE) IMPLANT
STEM FEMORAL SZ9 STD ACTIS (Stem) ×2 IMPLANT
STRIP CLOSURE SKIN 1/2X4 (GAUZE/BANDAGES/DRESSINGS) IMPLANT
SUT ETHIBOND NAB CT1 #1 30IN (SUTURE) ×2 IMPLANT
SUT ETHILON 2 0 PS N (SUTURE) ×6 IMPLANT
SUT MNCRL AB 4-0 PS2 18 (SUTURE) ×2 IMPLANT
SUT VIC AB 0 CT1 36 (SUTURE) ×2 IMPLANT
SUT VIC AB 1 CT1 36 (SUTURE) ×2 IMPLANT
SUT VIC AB 2-0 CT1 27 (SUTURE) ×4
SUT VIC AB 2-0 CT1 TAPERPNT 27 (SUTURE) ×2 IMPLANT
TRAY FOLEY MTR SLVR 14FR STAT (SET/KITS/TRAYS/PACK) ×2 IMPLANT
YANKAUER SUCT BULB TIP NO VENT (SUCTIONS) ×2 IMPLANT

## 2021-06-17 NOTE — Anesthesia Postprocedure Evaluation (Signed)
Anesthesia Post Note  Patient: Leslie Knapp  Procedure(s) Performed: RIGHT TOTAL HIP ARTHROPLASTY ANTERIOR APPROACH (Right: Hip)     Patient location during evaluation: PACU Anesthesia Type: Spinal Level of consciousness: awake and alert and oriented Pain management: pain level controlled Vital Signs Assessment: post-procedure vital signs reviewed and stable Respiratory status: spontaneous breathing, nonlabored ventilation and respiratory function stable Cardiovascular status: blood pressure returned to baseline Postop Assessment: no apparent nausea or vomiting, spinal receding, no headache and no backache Anesthetic complications: no   No notable events documented.  Last Vitals:  Vitals:   06/17/21 1245 06/17/21 1300  BP: 111/84 114/76  Pulse: 72 69  Resp: 14 14  Temp:  37.1 C  SpO2: 97% 100%    Last Pain:  Vitals:   06/17/21 1300  TempSrc:   PainSc: 0-No pain   Pain Goal: Patients Stated Pain Goal: 4 (06/17/21 0837)  LLE Motor Response: Purposeful movement (06/17/21 1300) LLE Sensation: Increased (06/17/21 1300) RLE Motor Response: Purposeful movement (06/17/21 1300) RLE Sensation: Increased (06/17/21 1300)     Epidural/Spinal Function Cutaneous sensation: Able to Wiggle Toes (06/17/21 1300), Patient able to flex knees: Yes (06/17/21 1300), Patient able to lift hips off bed: No (06/17/21 1300), Back pain beyond tenderness at insertion site: No (06/17/21 1300), Progressively worsening motor and/or sensory loss: No (06/17/21 1300), Bowel and/or bladder incontinence post epidural: No (06/17/21 1300)  Marthenia Rolling

## 2021-06-17 NOTE — Interval H&P Note (Signed)
History and Physical Interval Note: The patient understands that she is here today for a right hip replacement to treat her right hip osteoarthritis.  There has been no acute or interval change in her medical status.  Please see recent H&P.  The risks and benefits of surgery been explained in detail and informed consent is obtained.  The right hip has been marked.  06/17/2021 9:06 AM  Leslie Knapp  has presented today for surgery, with the diagnosis of right hip osteoarthritis.  The various methods of treatment have been discussed with the patient and family. After consideration of risks, benefits and other options for treatment, the patient has consented to  Procedure(s): RIGHT TOTAL HIP ARTHROPLASTY ANTERIOR APPROACH (Right) as a surgical intervention.  The patient's history has been reviewed, patient examined, no change in status, stable for surgery.  I have reviewed the patient's chart and labs.  Questions were answered to the patient's satisfaction.     Mcarthur Rossetti

## 2021-06-17 NOTE — Anesthesia Preprocedure Evaluation (Addendum)
Anesthesia Evaluation  Patient identified by MRN, date of birth, ID band Patient awake    Reviewed: Allergy & Precautions, NPO status , Patient's Chart, lab work & pertinent test results  History of Anesthesia Complications (+) PONV and history of anesthetic complications  Airway Mallampati: II  TM Distance: >3 FB Neck ROM: Full    Dental  (+) Chipped,    Pulmonary PE (2020, on Eliquis (last dose 06/13/21))   Pulmonary exam normal        Cardiovascular Normal cardiovascular exam  TTE 06/15/21: EF 60-65%, mild LVH, grade I diastolic dysfunction, valves ok   Neuro/Psych  Headaches, negative psych ROS   GI/Hepatic negative GI ROS, Neg liver ROS,   Endo/Other  Morbid obesity  Renal/GU negative Renal ROS  negative genitourinary   Musculoskeletal  (+) Arthritis ,   Abdominal   Peds  Hematology negative hematology ROS (+)   Anesthesia Other Findings Day of surgery medications reviewed with patient.  Reproductive/Obstetrics negative OB ROS                            Anesthesia Physical Anesthesia Plan  ASA: 2  Anesthesia Plan: Spinal   Post-op Pain Management: Tylenol PO (pre-op)   Induction:   PONV Risk Score and Plan: 3 and Treatment may vary due to age or medical condition, Ondansetron, Propofol infusion and Dexamethasone  Airway Management Planned: Natural Airway and Simple Face Mask  Additional Equipment: None  Intra-op Plan:   Post-operative Plan:   Informed Consent: I have reviewed the patients History and Physical, chart, labs and discussed the procedure including the risks, benefits and alternatives for the proposed anesthesia with the patient or authorized representative who has indicated his/her understanding and acceptance.       Plan Discussed with: CRNA  Anesthesia Plan Comments:        Anesthesia Quick Evaluation

## 2021-06-17 NOTE — Brief Op Note (Signed)
06/17/2021  11:42 AM  PATIENT:  Leslie Knapp  70 y.o. female  PRE-OPERATIVE DIAGNOSIS:  right hip osteoarthritis  POST-OPERATIVE DIAGNOSIS:  right hip osteoarthritis  PROCEDURE:  Procedure(s): RIGHT TOTAL HIP ARTHROPLASTY ANTERIOR APPROACH (Right)  SURGEON:  Surgeon(s) and Role:    Mcarthur Rossetti, MD - Primary  PHYSICIAN ASSISTANT:  Benita Stabile, PA-C  ANESTHESIA:   spinal  EBL:  150 mL   COUNTS:  YES  DICTATION: .Other Dictation: Dictation Number [id number]  PLAN OF CARE: Admit for overnight observation  PATIENT DISPOSITION:  PACU - hemodynamically stable.   Delay start of Pharmacological VTE agent (>24hrs) due to surgical blood loss or risk of bleeding: no

## 2021-06-17 NOTE — Transfer of Care (Signed)
Immediate Anesthesia Transfer of Care Note  Patient: Leslie Knapp  Procedure(s) Performed: RIGHT TOTAL HIP ARTHROPLASTY ANTERIOR APPROACH (Right: Hip)  Patient Location: PACU  Anesthesia Type:Spinal  Level of Consciousness: awake, alert  and oriented  Airway & Oxygen Therapy: Patient Spontanous Breathing and Patient connected to face mask oxygen  Post-op Assessment: Report given to RN and Post -op Vital signs reviewed and stable  Post vital signs: Reviewed and stable  Last Vitals:  Vitals Value Taken Time  BP 109/73 06/17/21 1216  Temp    Pulse 82 06/17/21 1219  Resp 19 06/17/21 1219  SpO2 100 % 06/17/21 1219  Vitals shown include unvalidated device data.  Last Pain:  Vitals:   06/17/21 0837  TempSrc: Oral  PainSc: 0-No pain      Patients Stated Pain Goal: 4 (58/48/35 0757)  Complications: No notable events documented.

## 2021-06-17 NOTE — Anesthesia Procedure Notes (Signed)
Spinal  Patient location during procedure: OR Start time: 06/17/2021 10:28 AM End time: 06/17/2021 10:33 AM Reason for block: surgical anesthesia Staffing Performed: anesthesiologist  Anesthesiologist: Brennan Bailey, MD Preanesthetic Checklist Completed: patient identified, IV checked, risks and benefits discussed, surgical consent, monitors and equipment checked, pre-op evaluation and timeout performed Spinal Block Patient position: sitting Prep: DuraPrep and site prepped and draped Patient monitoring: continuous pulse ox, blood pressure and heart rate Approach: midline Location: L3-4 Injection technique: single-shot Needle Needle type: Whitacre  Needle gauge: 25 G Needle length: 12.7 cm Assessment Events: CSF return Additional Notes Risks, benefits, and alternative discussed. Patient gave consent to procedure. Prepped and draped in sitting position. Patient sedated but responsive to voice. Difficult spinal due to body habitus, multiple needle redirections at same level. Whitacre 12.7cm needle required to access intrathecal space. Clear CSF obtained. Positive terminal aspiration. No pain or paraesthesias with injection. Patient tolerated procedure well. Vital signs stable. Tawny Asal, MD

## 2021-06-18 DIAGNOSIS — Z8616 Personal history of COVID-19: Secondary | ICD-10-CM | POA: Diagnosis not present

## 2021-06-18 DIAGNOSIS — Z79899 Other long term (current) drug therapy: Secondary | ICD-10-CM | POA: Diagnosis not present

## 2021-06-18 DIAGNOSIS — M1611 Unilateral primary osteoarthritis, right hip: Secondary | ICD-10-CM | POA: Diagnosis not present

## 2021-06-18 DIAGNOSIS — Z7901 Long term (current) use of anticoagulants: Secondary | ICD-10-CM | POA: Diagnosis not present

## 2021-06-18 DIAGNOSIS — Z96653 Presence of artificial knee joint, bilateral: Secondary | ICD-10-CM | POA: Diagnosis not present

## 2021-06-18 LAB — BASIC METABOLIC PANEL
Anion gap: 9 (ref 5–15)
BUN: 10 mg/dL (ref 8–23)
CO2: 23 mmol/L (ref 22–32)
Calcium: 8.7 mg/dL — ABNORMAL LOW (ref 8.9–10.3)
Chloride: 106 mmol/L (ref 98–111)
Creatinine, Ser: 0.52 mg/dL (ref 0.44–1.00)
GFR, Estimated: >60 mL/min (ref >60)
Glucose, Bld: 142 mg/dL — ABNORMAL HIGH (ref 70–99)
Potassium: 4.1 mmol/L (ref 3.5–5.1)
Sodium: 138 mmol/L (ref 135–145)

## 2021-06-18 LAB — CBC
HCT: 40.1 % (ref 36.0–46.0)
Hemoglobin: 13.5 g/dL (ref 12.0–15.0)
MCH: 30.2 pg (ref 26.0–34.0)
MCHC: 33.7 g/dL (ref 30.0–36.0)
MCV: 89.7 fL (ref 80.0–100.0)
Platelets: 178 10*3/uL (ref 150–400)
RBC: 4.47 MIL/uL (ref 3.87–5.11)
RDW: 12.9 % (ref 11.5–15.5)
WBC: 10.6 10*3/uL — ABNORMAL HIGH (ref 4.0–10.5)
nRBC: 0 % (ref 0.0–0.2)

## 2021-06-18 MED ORDER — METHOCARBAMOL 500 MG PO TABS: 500.0000 mg | ORAL_TABLET | Freq: Four times a day (QID) | ORAL | 1 refills | Status: DC | PRN

## 2021-06-18 MED ORDER — HYDROCODONE-ACETAMINOPHEN 5-325 MG PO TABS: 1.0000 | ORAL_TABLET | Freq: Four times a day (QID) | ORAL | 0 refills | Status: DC | PRN

## 2021-06-18 NOTE — TOC Initial Note (Signed)
Transition of Care [facility]) - Initial/Assessment Note    Patient Details  Name: Leslie Knapp MRN: [id number] Date of Birth: 19-Jan-1951  [facility]) CM/SW Contact:    Leslie Mage, LCSW Phone Number: 06/18/2021, 12:31 PM  Clinical Narrative:    Patient seen in follow up to d/c needs.  Leslie Knapp states she has all needed DME.  She lives in [place] with her sister and nephew, says sister works from home as Leslie Knapp and is hoping to return.  She plans on hiring CNA to be in home if she ends up needing two person assist, and if it is doable.  She acknowledges that her workout with PT this AM was rough, states she is supposed to be seen again this afternoon to see how she progresses.   She is hoping for the best.  Says she knows that d/c today is unlikely. TOC will continue to follow during the course of hospitalization.             Expected Discharge Plan:  (SNF v [facility]) Barriers to Discharge: Continued Medical Work up   Patient Goals and CMS Choice        Expected Discharge Plan and Services Expected Discharge Plan:  (SNF v [facility])   Discharge Planning Services: CM Consult   Living arrangements for the past 2 months: Single Family Home                                      Prior Living Arrangements/Services Living arrangements for the past 2 months: Single Family Home Lives with:: Siblings Patient language and need for interpreter reviewed:: Yes        Need for Family Participation in Patient Care: Yes (Comment) Care giver support system in place?: Yes (comment) Current home services: DME Criminal Activity/Legal Involvement Pertinent to Current Situation/Hospitalization: No - Comment as needed  Activities of Daily Living Home Assistive Devices/Equipment: Walker (specify type), Bedside commode/3-in-1, Shower chair without back, Eyeglasses ADL Screening (condition at time of admission) Patient's cognitive ability adequate to safely complete daily activities?:  Yes Is the patient deaf or have difficulty hearing?: No Does the patient have difficulty seeing, even when wearing glasses/contacts?: No Does the patient have difficulty concentrating, remembering, or making decisions?: No Patient able to express need for assistance with ADLs?: Yes Does the patient have difficulty dressing or bathing?: No Independently performs ADLs?: Yes (appropriate for developmental age) Does the patient have difficulty walking or climbing stairs?: Yes Weakness of Legs: Both Weakness of Arms/Hands: None  Permission Sought/Granted                  Emotional Assessment Appearance:: Appears stated age Attitude/Demeanor/Rapport: Engaged Affect (typically observed): Appropriate Orientation: : Oriented to Self, Oriented to Place, Oriented to  Time, Oriented to Situation Alcohol / Substance Use: Not Applicable Psych Involvement: No (comment)  Admission diagnosis:  Status post total hip replacement, right [T70.017] Patient Active Problem List   Diagnosis Date Noted   Status post total hip replacement, right 06/17/2021   Unilateral primary osteoarthritis, right hip 06/16/2021   Right bundle branch block 06/15/2021   Preoperative clearance 06/15/2021   Hyperlipidemia 06/15/2021   Posterior vitreous detachment of left eye 09/13/2020   Nuclear sclerotic cataract of both eyes 09/13/2020   Thickened endometrium 04/07/2020   Vaginal discharge 04/07/2020   S/P insertion of IVC (inferior vena caval) filter 06/17/2019   PFO  with atrial septal aneurysm 03/21/2019   Aortic atherosclerosis ([place]) 03/17/2019   Brachial artery occlusion, 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   Abdominal pain 03/08/2019   Pure hypercholesterolemia 09/25/2017   Morbid obesity ([place]) 06/07/2017   Encounter for Medicare annual wellness exam 06/07/2017   Vitamin D deficiency 06/07/2017   IBS (irritable bowel syndrome) 08/29/2016   Migraine 08/29/2016   Disorder of musculoskeletal system 03/23/2015   Eye  exam abnormal 03/23/2015   NASH (nonalcoholic steatohepatitis) 10/19/2011   PCP:  Leslie Blase, MD Pharmacy:   [place], [place] [address] Phone: [phone] Fax: [phone]     Social Determinants of Health (SDOH) Interventions    Readmission Risk Interventions No flowsheet data found.

## 2021-06-18 NOTE — Progress Notes (Signed)
Subjective: 1 Day Post-Op Procedure(s) (LRB): RIGHT TOTAL HIP ARTHROPLASTY ANTERIOR APPROACH (Right) Patient reports pain as moderate.  Reports limited mobility with the first PT session this morning.  Objective: Vital signs in last 24 hours: Temp:  [97.6 F (36.4 C)-98.9 F (37.2 C)] 98.6 F (37 C) (12/10 0547) Pulse Rate:  [69-102] 82 (12/10 0547) Resp:  [10-21] 16 (12/10 0547) BP: (98-140)/(61-87) 116/69 (12/10 0547) SpO2:  [91 %-100 %] 95 % (12/10 0547)  Intake/Output from previous day: 12/09 0701 - 12/10 0700 In: 3054.7 [P.O.:785; I.V.:2119.7; IV Piggyback:150] Out: 2700 [Urine:2500; Blood:200] Intake/Output this shift: No intake/output data recorded.  Recent Labs    06/18/21 0333  HGB 13.5   Recent Labs    06/18/21 0333  WBC 10.6*  RBC 4.47  HCT 40.1  PLT 178   Recent Labs    06/18/21 0333  NA 138  K 4.1  CL 106  CO2 23  BUN 10  CREATININE 0.52  GLUCOSE 142*  CALCIUM 8.7*   No results for input(s): LABPT, INR in the last 72 hours.  Sensation intact distally Intact pulses distally Dorsiflexion/Plantar flexion intact Incision: dressing C/D/I   Assessment/Plan: 1 Day Post-Op Procedure(s) (LRB): RIGHT TOTAL HIP ARTHROPLASTY ANTERIOR APPROACH (Right) Up with therapy  Discharge to home when clears therapy - not likely today.    Mcarthur Rossetti 06/18/2021, 11:02 AM

## 2021-06-18 NOTE — Progress Notes (Signed)
Physical Therapy Treatment Patient Details Name: Leslie Knapp MRN: [id number] DOB: Jun 23, 1951 Today's Date: 06/18/2021   History of Present Illness Pt s/p R THR and with hx of morbid obesity, bil TKR, RBBB, saddle PE in 2020, need for bil TSR and L THR in future.    PT Comments    Pt s/p R THR and presents with functional mobility limitations 2* generalized weakness, morbid obesity, premorbid deconditioning, post op pain, decreased R LE strength/ROM and need for bil TSR and L THR.  Pt hopes to return to prior living arrangement with assist of sister.   Recommendations for follow up therapy are one component of a multi-disciplinary discharge planning process, led by the attending physician.  Recommendations may be updated based on patient status, additional functional criteria and insurance authorization.  Follow Up Recommendations  Follow physician's recommendations for discharge plan and follow up therapies     Assistance Recommended at Discharge Frequent or constant Supervision/Assistance  Equipment Recommendations  None recommended by PT    Recommendations for Other Services OT consult     Precautions / Restrictions Precautions Precautions: Fall Restrictions Weight Bearing Restrictions: No     Mobility  Bed Mobility Overal bed mobility: Needs Assistance Bed Mobility: Supine to Sit;Sit to Supine     Supine to sit: Max assist;+2 for physical assistance;+2 for safety/equipment Sit to supine: Max assist;+2 for physical assistance;+2 for safety/equipment   General bed mobility comments: Increased time with extensive use of bed pad and bed rails to rotate to/from EOB    Transfers                   General transfer comment: Standing attempted x 2 but pt pain limited    Ambulation/Gait                   Stairs             Wheelchair Mobility    Modified Rankin (Stroke Patients Only)       Balance Overall balance assessment: Needs  assistance Sitting-balance support: No upper extremity supported;Feet supported Sitting balance-Leahy Scale: Fair                                      Cognition Arousal/Alertness: Awake/alert Behavior During Therapy: WFL for tasks assessed/performed Overall Cognitive Status: Within Functional Limits for tasks assessed                                          Exercises Total Joint Exercises Ankle Circles/Pumps: AROM;Both;15 reps;Supine Quad Sets: AROM;Both;10 reps;Supine Heel Slides: AAROM;Right;15 reps;Supine Hip ABduction/ADduction: AAROM;Right;15 reps;Supine    General Comments        Pertinent Vitals/Pain Pain Assessment: 0-10 Pain Score: 7  Pain Location: R hip/thigh Pain Descriptors / Indicators: Aching;Grimacing;Guarding;Moaning;Sore;Tightness Pain Intervention(s): Limited activity within patient's tolerance;Monitored during session;Patient requesting pain meds-RN notified (pt had declined pain meds)    Home Living Family/patient expects to be discharged to:: Private residence Living Arrangements: Other relatives Available Help at Discharge: Available PRN/intermittently Type of Home: House Home Access: [place]: One [place]: Conservation officer, nature (2 wheels);Other (comment);BSC/3in1 Additional Comments: Pt sleeps on PT mat table with rails attached.  Pt uses lift chair    Prior Function  PT Goals (current goals can now be found in the care plan section) Acute Rehab PT Goals Patient Stated Goal: Not have to go to rehab PT Goal Formulation: With patient Time For Goal Achievement: 07/02/21 Potential to Achieve Goals: Fair    Frequency    7X/week      PT Plan      Co-evaluation              AM-PAC PT "6 Clicks" Mobility   Outcome Measure  Help needed turning from your back to your side while in a flat bed without using bedrails?: A Lot Help needed moving from lying on  your back to sitting on the side of a flat bed without using bedrails?: A Lot Help needed moving to and from a bed to a chair (including a wheelchair)?: A Lot Help needed standing up from a chair using your arms (e.g., wheelchair or bedside chair)?: Total Help needed to walk in hospital room?: Total   6 Click Score: 8    End of Session Equipment Utilized During Treatment: Gait belt Activity Tolerance: Patient limited by fatigue;Patient limited by pain Patient left: in bed;with call bell/phone within reach;with bed alarm set Nurse Communication: Mobility status PT Visit Diagnosis: Muscle weakness (generalized) (M62.81);Difficulty in walking, not elsewhere classified (R26.2);Pain Pain - Right/Left: Right Pain - part of body: Hip     Time: 2671-2458 PT Time Calculation (min) (ACUTE ONLY): 52 min  Charges:  $Therapeutic Exercise: 8-22 mins $Therapeutic Activity: 8-22 mins                     Debe Coder PT Acute Rehabilitation Services Pager [phone] Office [phone]    Alessia Gonsalez 06/18/2021, 1:22 PM

## 2021-06-18 NOTE — Discharge Instructions (Signed)

## 2021-06-18 NOTE — Op Note (Signed)
NAME: Leslie Knapp, Leslie Knapp MEDICAL RECORD NO: [id number] ACCOUNT NO: [id number] DATE OF BIRTH: 1951/01/26 FACILITY: [facility]: [facility] PHYSICIAN: Lind Guest. Ninfa Linden, MD  Operative Report   DATE OF PROCEDURE: 06/17/2021  PREOPERATIVE DIAGNOSIS:   1.  Primary osteoarthritis and degenerative joint disease, right hip. 2.  Morbid obesity with a BMI of over 40.  POSTOPERATIVE DIAGNOSIS:   1.  Primary osteoarthritis and degenerative joint disease, right hip. 2.  Morbid obesity with a BMI of over 40.  PROCEDURE:  Right total hip arthroplasty through direct anterior approach.  IMPLANTS:  DePuy sector Gription acetabular component size 52, size 36+4 neutral polyethylene liner, size 9 ACTIS femoral component with standard offset, size 36+1.5 metal hip ball.  SURGEON:  Jean Rosenthal, M.D.   ASSISTANT: Erskine Emery, PA-C.  ANESTHESIA:  Spinal.  ANTIBIOTICS:  3 grams IV Ancef.  ESTIMATED BLOOD LOSS:  390 mL  COMPLICATIONS:  None.  INDICATIONS:  The patient is a 70 year old female who is very pleasant individual.  She has end-stage arthritis of both her hips.  She has actually had knee replacements done remotely.  At this point, her hip pain is detrimentally affecting her mobility,  her quality of life and activities of daily living to the point she does wish to proceed with a total hip arthroplasty on the right side.  Her x-rays do show severe arthritis of the right hip and this correlates also with clinical exam.  She understands  with her morbid obesity, she has a heightened risk of acute blood loss anemia, nerve or vessel injury, fracture, infection, dislocation, DVT, implant failure, skin and soft tissue issues and leg length differences.  She understands our goals are  hopefully decrease pain, improve mobility and overall improve quality of life.  DESCRIPTION OF PROCEDURE:  After informed consent was obtained, appropriate right hip was marked, she was brought to the operating  room and sat up on the stretcher where spinal anesthesia was obtained.  She was then laid in supine position on the  stretcher.  A Foley catheter was placed and traction boots were placed on both her feet.  Next, she was placed supine on the Hana fracture table, the perineal post in place and both legs in line skeletal traction device and no traction applied.  Her  right operative hip was prepped and draped with DuraPrep and sterile drapes.  A timeout was called and she was identified correct patient, correct right hip.  We then made an incision just inferior and posterior to the anterior superior iliac spine and  carried this obliquely down the leg.  We dissected down tensor fascia lata muscle.  The tensor fascia was divided longitudinally to proceed with direct anterior approach to the hip.  We identified and cauterized circumflex vessels and identified the hip  capsule, opened the hip capsule in L-type format finding a moderate joint effusion.  We placed Cobra retractors around the medial and lateral femoral neck and made a femoral neck cut with oscillating saw just proximal to the lesser trochanter and  completed this with an osteotome.  We placed a corkscrew guide in the femoral head and removed the femoral head in its entirety and found a wide area devoid of cartilage.  I then placed a bent Hohmann of the medial acetabular rim and removed remnants of  acetabular labrum and other debris.  We then began reaming under direct visualization from a size 43 reamer in a stepwise increments going up to a size 51 reamer with all reamers placed  under direct visualization, the last reamer was also placed under  direct fluoroscopy, so I could obtain my depth of reaming by inclination and anteversion.  I then placed real DePuy sector Gription acetabular component size 52 and we went with a 36+4 polyethylene liner for that size acetabular component.  Attention was  then turned to the femur with the leg externally  rotated to 120 degrees and adducted. We were able to place a Mueller retractor medially and Hohman retractor behind the greater trochanter.  We released lateral joint capsule and used a box cutting  osteotome to enter femoral canal and a rongeur to lateralize, we then began broaching using the ACTIS broaching system from DePuy from a size 0 going all the way up to a size 9. With a size 9 in place, we trialed a standard offset femoral neck and a  36+15 hip ball, reduced this in acetabulum and it was very tight throughout its arc of motion.  We were pleased with range of motion and stability and assessed this radiographically and we were pleased with the offset and leg length.  We then dislocated  the hip and removed the trial components.  We placed the real ACTIS component, size 9 and the real 36+1.5 metal hip ball and again reduced this in acetabulum and it was stable.  We then irrigated the soft tissue with normal saline solution using  pulsatile lavage.  We closed the joint capsule with interrupted #1 Ethibond suture followed by #1 Vicryl, closed the tensor fascia.  0 Vicryl was used to close the deep tissue and 2-0 Vicryl was used to close the subcutaneous tissue.  The skin was closed  with 2-0 nylon suture.  Xeroform well-padded sterile dressing was applied.  She was taken to recovery room in stable condition.  All final counts were correct.  No complications noted.  Of note, Benita Stabile, PA-C did assist during the entire case and his  assistance was crucial for facilitating every aspect of this case.     Elián.Darby D: 06/17/2021 11:41:04 am T: 06/18/2021 12:21:00 am  JOB: [id number]/ [id number]

## 2021-06-18 NOTE — Plan of Care (Signed)
  Problem: Health Behavior/Discharge Planning: Goal: Ability to manage health-related needs will improve Outcome: Progressing   Problem: Clinical Measurements: Goal: Ability to maintain clinical measurements within normal limits will improve Outcome: Progressing Goal: Will remain free from infection Outcome: Progressing Goal: Diagnostic test results will improve Outcome: Progressing Goal: Respiratory complications will improve Outcome: Progressing Goal: Cardiovascular complication will be avoided Outcome: Progressing   Problem: Activity: Goal: Risk for activity intolerance will decrease Outcome: Progressing   Problem: Nutrition: Goal: Adequate nutrition will be maintained Outcome: Progressing   Problem: Coping: Goal: Level of anxiety will decrease Outcome: Progressing   Problem: Elimination: Goal: Will not experience complications related to bowel motility Outcome: Progressing Goal: Will not experience complications related to urinary retention Outcome: Progressing   Problem: Pain Managment: Goal: General experience of comfort will improve Outcome: Progressing   Problem: Safety: Goal: Ability to remain free from injury will improve Outcome: Progressing   Problem: Skin Integrity: Goal: Risk for impaired skin integrity will decrease Outcome: Progressing   Problem: Education: Goal: Knowledge of the prescribed therapeutic regimen will improve Outcome: Progressing Goal: Understanding of discharge needs will improve Outcome: Progressing   Problem: Activity: Goal: Ability to avoid complications of mobility impairment will improve Outcome: Progressing Goal: Ability to tolerate increased activity will improve Outcome: Progressing   Problem: Clinical Measurements: Goal: Postoperative complications will be avoided or minimized Outcome: Progressing   Problem: Pain Management: Goal: Pain level will decrease with appropriate interventions Outcome: Progressing    Problem: Skin Integrity: Goal: Will show signs of wound healing Outcome: Progressing

## 2021-06-18 NOTE — Progress Notes (Signed)
Physical Therapy Treatment Patient Details Name: Leslie Knapp MRN: [id number] DOB: 08/18/1950 Today's Date: 06/18/2021   History of Present Illness Pt s/p R THR and with hx of morbid obesity, bil TKR, RBBB, saddle PE in 2020, need for bil TSR and L THR in future.    PT Comments    Pt is very cooperative and progressing with mobility but requiring increased time and significant assist of 2 to safely perform basic mobility tasks.  Pt hopeful for return home rather than to rehab and states she now has 2 person assist available at home.   Recommendations for follow up therapy are one component of a multi-disciplinary discharge planning process, led by the attending physician.  Recommendations may be updated based on patient status, additional functional criteria and insurance authorization.  Follow Up Recommendations  Follow physician's recommendations for discharge plan and follow up therapies     Assistance Recommended at Discharge Frequent or constant Supervision/Assistance  Equipment Recommendations  None recommended by PT    Recommendations for Other Services OT consult     Precautions / Restrictions Precautions Precautions: Fall Restrictions Weight Bearing Restrictions: No     Mobility  Bed Mobility Overal bed mobility: Needs Assistance Bed Mobility: Supine to Sit;Sit to Supine     Supine to sit: Mod assist;+2 for physical assistance;+2 for safety/equipment Sit to supine: Mod assist;Max assist;+2 for physical assistance;+2 for safety/equipment   General bed mobility comments: Increased time with extensive use of bed pad and bed rails to rotate to/from EOB.  Pt with slide sheet under to increase ease of movement    Transfers Overall transfer level: Needs assistance Equipment used: Rolling walker (2 wheels) Transfers: Sit to/from Stand Sit to Stand: Mod assist;+2 physical assistance;+2 safety/equipment;From elevated surface           General transfer comment:  Multiple attempts before achieving standing.  Extensive use of bed to lift pt with further assist to bring wt up and fwd and to balance in standing with RW    Ambulation/Gait Ambulation/Gait assistance: Min assist;+2 physical assistance;+2 safety/equipment Gait Distance (Feet): 3 Feet Assistive device: Rolling walker (2 wheels) Gait Pattern/deviations: Step-to pattern;Decreased step length - right;Decreased step length - left;Shuffle;Trunk flexed Gait velocity: decr     General Gait Details: Pt able to take short shuffling steps to back up to bed and to side-step up to top   Stairs             Wheelchair Mobility    Modified Rankin (Stroke Patients Only)       Balance Overall balance assessment: Needs assistance Sitting-balance support: No upper extremity supported;Feet supported Sitting balance-Leahy Scale: Fair     Standing balance support: Bilateral upper extremity supported Standing balance-Leahy Scale: Poor                              Cognition Arousal/Alertness: Awake/alert Behavior During Therapy: WFL for tasks assessed/performed Overall Cognitive Status: Within Functional Limits for tasks assessed                                          Exercises      General Comments        Pertinent Vitals/Pain Pain Assessment: 0-10 Pain Score: 5  Pain Location: R hip/thigh Pain Descriptors / Indicators: Aching;Grimacing;Guarding;Moaning;Sore;Tightness Pain Intervention(s): Limited activity within patient's tolerance;Monitored during session;Premedicated  before session    Home Living                          Prior Function            PT Goals (current goals can now be found in the care plan section) Acute Rehab PT Goals Patient Stated Goal: Not have to go to rehab PT Goal Formulation: With patient Time For Goal Achievement: 07/02/21 Potential to Achieve Goals: Fair Progress towards PT goals: Progressing toward  goals    Frequency    7X/week      PT Plan Current plan remains appropriate    Co-evaluation              AM-PAC PT "6 Clicks" Mobility   Outcome Measure  Help needed turning from your back to your side while in a flat bed without using bedrails?: A Lot Help needed moving from lying on your back to sitting on the side of a flat bed without using bedrails?: A Lot Help needed moving to and from a bed to a chair (including a wheelchair)?: A Lot Help needed standing up from a chair using your arms (e.g., wheelchair or bedside chair)?: A Lot Help needed to walk in hospital room?: Total Help needed climbing 3-5 steps with a railing? : Total 6 Click Score: 10    End of Session Equipment Utilized During Treatment: Gait belt Activity Tolerance: Patient limited by fatigue;Patient limited by pain Patient left: in bed;with call bell/phone within reach;with bed alarm set Nurse Communication: Mobility status PT Visit Diagnosis: Muscle weakness (generalized) (M62.81);Difficulty in walking, not elsewhere classified (R26.2);Pain Pain - Right/Left: Right Pain - part of body: Hip     Time: 0454-0981 PT Time Calculation (min) (ACUTE ONLY): 39 min  Charges:  $Therapeutic Activity: 38-52 mins                     Debe Coder PT Acute Rehabilitation Services Pager [phone] Office [phone]    Temple Sporer 06/18/2021, 5:55 PM

## 2021-06-19 DIAGNOSIS — Z96653 Presence of artificial knee joint, bilateral: Secondary | ICD-10-CM | POA: Diagnosis not present

## 2021-06-19 DIAGNOSIS — Z8616 Personal history of COVID-19: Secondary | ICD-10-CM | POA: Diagnosis not present

## 2021-06-19 DIAGNOSIS — M1611 Unilateral primary osteoarthritis, right hip: Secondary | ICD-10-CM | POA: Diagnosis not present

## 2021-06-19 DIAGNOSIS — Z7901 Long term (current) use of anticoagulants: Secondary | ICD-10-CM | POA: Diagnosis not present

## 2021-06-19 DIAGNOSIS — Z79899 Other long term (current) drug therapy: Secondary | ICD-10-CM | POA: Diagnosis not present

## 2021-06-19 NOTE — Progress Notes (Signed)
  Subjective: Leslie Knapp is a 70 y.o. female s/p right THA.  They are POD 2.  Pt's pain is controlled at times and moderate to severe at times.  Pt has ambulated with great difficulty.  She was able to ambulate 4 steps in physical therapy yesterday.  She required 2 person assist.  She states that her sister will be staying with her at home and she also has hired a CNA to help her so she will have 2 people available.  She also have a third person available in the next week when her grandson comes home.  She denies any chest pain, shortness of breath, calf pain, abdominal pain.  Denies any subjective fevers, chills, night sweats.  She wants to avoid going to a rehab center.  She also had pure wick "malfunction" with urination throughout much of the bed sheets and her gown.   Objective: Vital signs in last 24 hours: Temp:  [98.4 F (36.9 C)-100.3 F (37.9 C)] 100.3 F (37.9 C) (12/11 0512) Pulse Rate:  [79-92] 92 (12/11 0512) Resp:  [16-18] 16 (12/11 0512) BP: (95-107)/(54-64) 107/64 (12/11 0512) SpO2:  [92 %-94 %] 93 % (12/11 0512)  Intake/Output from previous day: 12/10 0701 - 12/11 0700 In: 1088.3 [P.O.:240; I.V.:848.3] Out: 250 [Urine:250] Intake/Output this shift: Total I/O In: 120 [P.O.:120] Out: -   Exam:  No gross blood or drainage overlying the dressing.  Very minimal erythema posterior to the incision with no circumferential erythema noted around the incision. 2+ DP pulse Sensation intact distally in the right foot Able to dorsiflex and plantarflex the right foot No calf tenderness.  Negative Homans' sign.   Labs: Recent Labs    06/18/21 0333  HGB 13.5   Recent Labs    06/18/21 0333  WBC 10.6*  RBC 4.47  HCT 40.1  PLT 178   Recent Labs    06/18/21 0333  NA 138  K 4.1  CL 106  CO2 23  BUN 10  CREATININE 0.52  GLUCOSE 142*  CALCIUM 8.7*   No results for input(s): LABPT, INR in the last 72 hours.  Assessment/Plan: Pt is POD 2 s/p right THA.     -Plan to discharge to home versus SNF in coming days pending patient's pain and PT eval  -WBAT with a walker  -Patient reports that she has significant improvement in pain with keeping her leg completely straight but increasing pain with any movement of the hip.  She has struggled in physical therapy but she has 2 people at home that can help her so if she continues to be able to manage with a 2 person assist, could consider discharge home.  She also had difficulty with urination due to pure wick and the bed sheets were removed today and will be replaced with clean, nonsoiled sheets.  The incision has no surrounding erythema and is dry.  There is no residual moisture around the bandage or any disruption to the seal of the bandage.     Leslie Knapp 06/19/2021, 10:10 AM

## 2021-06-19 NOTE — Progress Notes (Signed)
Physical Therapy Treatment Patient Details Name: Leslie Knapp MRN: [id number] DOB: [birth year] Today's Date: 06/19/2021   History of Present Illness Pt s/p R THR and with hx of morbid obesity, bil TKR, RBBB, saddle PE in 2020, need for bil TSR and L THR in future.    PT Comments    Pt very motivated to progress and not have to dc to SNF for rehab.  Noted improvement in all tasks with decreased assist required and pt able to ambulate limited distance in room with RW and min assist of 2 for safety.   Recommendations for follow up therapy are one component of a multi-disciplinary discharge planning process, led by the attending physician.  Recommendations may be updated based on patient status, additional functional criteria and insurance authorization.  Follow Up Recommendations  Follow physician's recommendations for discharge plan and follow up therapies     Assistance Recommended at Discharge Frequent or constant Supervision/Assistance  Equipment Recommendations  None recommended by PT    Recommendations for Other Services OT consult     Precautions / Restrictions Precautions Precautions: Fall Restrictions Weight Bearing Restrictions: No RLE Weight Bearing: Weight bearing as tolerated     Mobility  Bed Mobility Overal bed mobility: Needs Assistance Bed Mobility: Supine to Sit;Sit to Supine     Supine to sit: Mod assist;+2 for physical assistance;+2 for safety/equipment Sit to supine: Mod assist;+2 for physical assistance   General bed mobility comments: Increased time with extensive use of bed rails to rotate to/from EOB.  Pt with maxi-slide sheet under to increase ease of movement    Transfers Overall transfer level: Needs assistance Equipment used: Rolling walker (2 wheels) Transfers: Sit to/from Stand Sit to Stand: +2 physical assistance;+2 safety/equipment;From elevated surface;Min assist           General transfer comment: Use of bed to lift pt with  further assist to bring wt up and fwd and to balance in standing with RW    Ambulation/Gait Ambulation/Gait assistance: Min assist;+2 physical assistance;+2 safety/equipment Gait Distance (Feet): 23 Feet Assistive device: Rolling walker (2 wheels) Gait Pattern/deviations: Step-to pattern;Decreased step length - right;Decreased step length - left;Shuffle;Trunk flexed Gait velocity: decr     General Gait Details: Pt walking from bed to window seat x 4 - stating did not want to get too far from bed   Stairs             Wheelchair Mobility    Modified Rankin (Stroke Patients Only)       Balance Overall balance assessment: Needs assistance Sitting-balance support: No upper extremity supported;Feet supported Sitting balance-Leahy Scale: Good     Standing balance support: Bilateral upper extremity supported Standing balance-Leahy Scale: Poor                              Cognition Arousal/Alertness: Awake/alert Behavior During Therapy: WFL for tasks assessed/performed Overall Cognitive Status: Within Functional Limits for tasks assessed                                          Exercises Total Joint Exercises Ankle Circles/Pumps: AROM;Both;15 reps;Supine Quad Sets: AROM;Both;10 reps;Supine Heel Slides: AAROM;Right;Supine;20 reps Hip ABduction/ADduction: AAROM;Right;15 reps;Supine    General Comments        Pertinent Vitals/Pain Pain Assessment: 0-10 Pain Score: 4  Pain Location: R hip/thigh Pain Descriptors /  Indicators: Aching;Grimacing;Guarding;Moaning;Sore;Tightness;Burning Pain Intervention(s): Limited activity within patient's tolerance;Monitored during session;Premedicated before session;Ice applied    Home Living                          Prior Function            PT Goals (current goals can now be found in the care plan section) Acute Rehab PT Goals Patient Stated Goal: Not have to go to rehab PT Goal  Formulation: With patient Time For Goal Achievement: 07/02/21 Potential to Achieve Goals: Fair Progress towards PT goals: Progressing toward goals    Frequency    7X/week      PT Plan Current plan remains appropriate    Co-evaluation              AM-PAC PT "6 Clicks" Mobility   Outcome Measure  Help needed turning from your back to your side while in a flat bed without using bedrails?: A Lot Help needed moving from lying on your back to sitting on the side of a flat bed without using bedrails?: A Lot Help needed moving to and from a bed to a chair (including a wheelchair)?: A Lot Help needed standing up from a chair using your arms (e.g., wheelchair or bedside chair)?: A Lot Help needed to walk in hospital room?: Total Help needed climbing 3-5 steps with a railing? : Total 6 Click Score: 10    End of Session Equipment Utilized During Treatment: Gait belt Activity Tolerance: Patient tolerated treatment well;Patient limited by fatigue Patient left: in bed;with call bell/phone within reach;with nursing/sitter in room Nurse Communication: Mobility status PT Visit Diagnosis: Muscle weakness (generalized) (M62.81);Difficulty in walking, not elsewhere classified (R26.2);Pain Pain - Right/Left: Right Pain - part of body: Hip     Time: 4081-4481 PT Time Calculation (min) (ACUTE ONLY): 40 min  Charges:  $Gait Training: 8-22 mins $Therapeutic Exercise: 8-22 mins $Therapeutic Activity: 8-22 mins                     Debe Coder PT Acute Rehabilitation Services Pager [phone] Office [phone]    Deshanti Adcox 06/19/2021, 12:54 PM

## 2021-06-19 NOTE — Progress Notes (Signed)
Physical Therapy Treatment Patient Details Name: Leslie Knapp MRN: [id number] DOB: [birth year] Today's Date: 06/19/2021   History of Present Illness Pt s/p R THR and with hx of morbid obesity, bil TKR, RBBB, saddle PE in 2020, need for bil TSR and L THR in future.    PT Comments    Pt continues to progress with mobility - requiring decreasing level of assist for all tasks and up to ambulate 44' this pm.  Pt plans dc to home with HHPT follow up and reports she will have assist of her sister who is RN and a friend who is a [employer].  Recommendations for follow up therapy are one component of a multi-disciplinary discharge planning process, led by the attending physician.  Recommendations may be updated based on patient status, additional functional criteria and insurance authorization.  Follow Up Recommendations  Follow physician's recommendations for discharge plan and follow up therapies     Assistance Recommended at Discharge Frequent or constant Supervision/Assistance  Equipment Recommendations  None recommended by PT    Recommendations for Other Services OT consult     Precautions / Restrictions Precautions Precautions: Fall Restrictions Weight Bearing Restrictions: No RLE Weight Bearing: Weight bearing as tolerated     Mobility  Bed Mobility Overal bed mobility: Needs Assistance Bed Mobility: Supine to Sit;Sit to Supine     Supine to sit: Min assist;+2 for physical assistance Sit to supine: Min assist;Mod assist;+2 for physical assistance   General bed mobility comments: Increased time with extensive use of bed rails to rotate to/from EOB.  Pt with maxi-slide sheet under to increase ease of movement.  Pt up on L side of bed and in on R side.  Assist to control trunk and manage LEs    Transfers Overall transfer level: Needs assistance Equipment used: Rolling walker (2 wheels) Transfers: Sit to/from Stand Sit to Stand: +2 physical assistance;+2 safety/equipment;From  elevated surface;Min assist           General transfer comment: Use of bed to lift pt with further assist to bring wt up and fwd and to balance in standing with RW    Ambulation/Gait Ambulation/Gait assistance: Min guard;+2 safety/equipment Gait Distance (Feet): 44 Feet Assistive device: Rolling walker (2 wheels) Gait Pattern/deviations: Step-to pattern;Decreased step length - right;Decreased step length - left;Shuffle;Trunk flexed Gait velocity: decr     General Gait Details: Increased time with cues for posture and position from RW and multiple short standing rest breaks to complete task.   Stairs             Wheelchair Mobility    Modified Rankin (Stroke Patients Only)       Balance Overall balance assessment: Needs assistance Sitting-balance support: No upper extremity supported;Feet supported Sitting balance-Leahy Scale: Good     Standing balance support: Bilateral upper extremity supported Standing balance-Leahy Scale: Poor                              Cognition Arousal/Alertness: Awake/alert Behavior During Therapy: WFL for tasks assessed/performed Overall Cognitive Status: Within Functional Limits for tasks assessed                                          Exercises Total Joint Exercises Ankle Circles/Pumps: AROM;Both;15 reps;Supine Quad Sets: AROM;Both;10 reps;Supine Heel Slides: AAROM;Right;Supine;20 reps Hip ABduction/ADduction: AAROM;Right;15 reps;Supine  General Comments        Pertinent Vitals/Pain Pain Assessment: 0-10 Pain Score: 4  Pain Location: R hip/thigh Pain Descriptors / Indicators: Aching;Grimacing;Moaning;Sore;Tightness;Burning Pain Intervention(s): Limited activity within patient's tolerance;Monitored during session;Premedicated before session;Ice applied    Home Living                          Prior Function            PT Goals (current goals can now be found in the care  plan section) Acute Rehab PT Goals Patient Stated Goal: Not have to go to rehab PT Goal Formulation: With patient Time For Goal Achievement: 07/02/21 Potential to Achieve Goals: Fair Progress towards PT goals: Progressing toward goals    Frequency    7X/week      PT Plan Current plan remains appropriate    Co-evaluation              AM-PAC PT "6 Clicks" Mobility   Outcome Measure  Help needed turning from your back to your side while in a flat bed without using bedrails?: A Lot Help needed moving from lying on your back to sitting on the side of a flat bed without using bedrails?: A Lot Help needed moving to and from a bed to a chair (including a wheelchair)?: A Lot Help needed standing up from a chair using your arms (e.g., wheelchair or bedside chair)?: A Lot Help needed to walk in hospital room?: A Little Help needed climbing 3-5 steps with a railing? : A Lot 6 Click Score: 13    End of Session Equipment Utilized During Treatment: Gait belt Activity Tolerance: Patient tolerated treatment well;Other (comment) (limited by nausea) Patient left: in bed;with call bell/phone within reach;with nursing/sitter in room Nurse Communication: Mobility status PT Visit Diagnosis: Muscle weakness (generalized) (M62.81);Difficulty in walking, not elsewhere classified (R26.2);Pain Pain - Right/Left: Right Pain - part of body: Hip     Time: 3570-1779 PT Time Calculation (min) (ACUTE ONLY): 36 min  Charges:  $Gait Training: 23-37 mins $Therapeutic Exercise: 8-22 mins $Therapeutic Activity: 8-22 mins                     Debe Coder PT Acute Rehabilitation Services Pager [phone] Office [phone]    Jaquay Morneault 06/19/2021, 3:54 PM

## 2021-06-19 NOTE — Plan of Care (Signed)
  Problem: Pain Managment: Goal: General experience of comfort will improve Outcome: Progressing   

## 2021-06-20 DIAGNOSIS — Z79899 Other long term (current) drug therapy: Secondary | ICD-10-CM | POA: Diagnosis not present

## 2021-06-20 DIAGNOSIS — Z96653 Presence of artificial knee joint, bilateral: Secondary | ICD-10-CM | POA: Diagnosis not present

## 2021-06-20 DIAGNOSIS — Z7901 Long term (current) use of anticoagulants: Secondary | ICD-10-CM | POA: Diagnosis not present

## 2021-06-20 DIAGNOSIS — M1611 Unilateral primary osteoarthritis, right hip: Secondary | ICD-10-CM | POA: Diagnosis not present

## 2021-06-20 DIAGNOSIS — Z8616 Personal history of COVID-19: Secondary | ICD-10-CM | POA: Diagnosis not present

## 2021-06-20 MED ORDER — ONDANSETRON HCL 4 MG PO TABS: 4.0000 mg | ORAL_TABLET | Freq: Three times a day (TID) | ORAL | 0 refills | Status: DC | PRN

## 2021-06-20 NOTE — Progress Notes (Signed)
Physical Therapy Treatment Patient Details Name: Leslie Knapp MRN: [id number] DOB: [birth year] Today's Date: 06/20/2021   History of Present Illness Pt s/p R THR and with hx of morbid obesity, bil TKR, RBBB, saddle PE in 2020, need for bil TSR and L THR in future.    PT Comments    Pt is meeting PT goals this am. Supine<>sit improved to min/guard to min assist. Transfers with min/guard assist from elevated surface and encouragement, ambulating household distance.  Pt sleeps on PT mat table that she can elevate, also has lift chair at home. Has one "very small" step to enter her home, however  plans to likely utilize ramp.  She is pleased with her progress and ready to d/c with family assist from PT standpoint.   Recommendations for follow up therapy are one component of a multi-disciplinary discharge planning process, led by the attending physician.  Recommendations may be updated based on patient status, additional functional criteria and insurance authorization.  Follow Up Recommendations  Follow physician's recommendations for discharge plan and follow up therapies     Assistance Recommended at Discharge Frequent or constant Supervision/Assistance  Equipment Recommendations  None recommended by PT    Recommendations for Other Services       Precautions / Restrictions Precautions Precautions: Fall Restrictions Weight Bearing Restrictions: No     Mobility  Bed Mobility   Bed Mobility: Supine to Sit;Sit to Supine     Supine to sit: Min guard Sit to supine: Min assist   General bed mobility comments: cues for  technique, use of gait belt as leg lifter for R LE, pt uses leg loop tethered to end of bed to self assist trunk to upright. able to come to sit with min/guard.  light min assist to lift LLE into bed. incr time.    Transfers Overall transfer level: Needs assistance Equipment used: Rolling walker (2 wheels) Transfers: Sit to/from Stand Sit to Stand: Min  guard;From elevated surface           General transfer comment: cues for technique, to power up with  bil LEs, min/guard for safety. good control of descent    Ambulation/Gait Ambulation/Gait assistance: Supervision;Min guard Gait Distance (Feet): 50 Feet Assistive device: Rolling walker (2 wheels) Gait Pattern/deviations: Step-to pattern;Decreased stance time - right       General Gait Details: cues for posture and position from RW. no rest breaks needed. slow pace but steady without LOB   Stairs             Wheelchair Mobility    Modified Rankin (Stroke Patients Only)       Balance     Sitting balance-Leahy Scale: Good     Standing balance support: Bilateral upper extremity supported Standing balance-Leahy Scale: Poor                              Cognition Arousal/Alertness: Awake/alert Behavior During Therapy: WFL for tasks assessed/performed Overall Cognitive Status: Within Functional Limits for tasks assessed                                          Exercises      General Comments        Pertinent Vitals/Pain Pain Assessment: 0-10 Pain Score: 4  Pain Location: R hip/thigh Pain Descriptors / Indicators: Burning;Sore Pain Intervention(s): Limited  activity within patient's tolerance;Monitored during session;Premedicated before session    Home Living                          Prior Function            PT Goals (current goals can now be found in the care plan section) Acute Rehab PT Goals Patient Stated Goal: Not have to go to rehab PT Goal Formulation: With patient Time For Goal Achievement: 07/02/21 Potential to Achieve Goals: Fair Progress towards PT goals: Progressing toward goals    Frequency    7X/week      PT Plan Current plan remains appropriate    Co-evaluation              AM-PAC PT "6 Clicks" Mobility   Outcome Measure  Help needed turning from your back to your side  while in a flat bed without using bedrails?: A Little Help needed moving from lying on your back to sitting on the side of a flat bed without using bedrails?: A Little Help needed moving to and from a bed to a chair (including a wheelchair)?: A Little Help needed standing up from a chair using your arms (e.g., wheelchair or bedside chair)?: A Little Help needed to walk in hospital room?: A Little Help needed climbing 3-5 steps with a railing? : A Lot 6 Click Score: 17    End of Session Equipment Utilized During Treatment: Gait belt Activity Tolerance: Patient tolerated treatment well Patient left: in bed;with call bell/phone within reach   PT Visit Diagnosis: Muscle weakness (generalized) (M62.81);Difficulty in walking, not elsewhere classified (R26.2);Pain Pain - Right/Left: Right Pain - part of body: Hip     Time: 7867-5449 PT Time Calculation (min) (ACUTE ONLY): 35 min  Charges:  $Gait Training: 8-22 mins $Therapeutic Activity: 8-22 mins                     Baxter Flattery, PT  Acute Rehab Dept ([place]) [phone] Pager [phone]  06/20/2021    [facility] 06/20/2021, 11:19 AM

## 2021-06-20 NOTE — Plan of Care (Signed)
Plan of care reviewed and discussed with the patient. 

## 2021-06-20 NOTE — Progress Notes (Signed)
Patient discharged to home w/ family. Given all belongings, instructions. Verbalized understanding of instructions. Escorted to pov via w/c. 

## 2021-06-20 NOTE — Discharge Summary (Signed)
Patient ID: Leslie Knapp MRN: [id number] DOB/AGE: Jul 16, 1950 70 y.o.  Admit date: 06/17/2021 Discharge date: 06/20/2021  Admission Diagnoses:  Principal Problem:   Unilateral primary osteoarthritis, right hip Active Problems:   Status post total hip replacement, right   Discharge Diagnoses:  Same  Past Medical History:  Diagnosis Date   Acute saddle pulmonary embolism ([place]) 87/5643   Complication of anesthesia    wants neck supported during surgery due to hx of migraine caused by concussion   Concussion 2010, 2000 and 1998   periodiotic migraine and occ dizzines   COVID-19 09/2019   sx covid pneumonia,fever, chills body aches, took monoclonal antibody tx done symptoms resolved after 6 weeks   CTS (carpal tunnel syndrome)    Fatty liver disease, nonalcoholic    In [facility] liver biopsy 2010 did not show fatty liver   Headache    History of blood transfusion 1975   after knee surgery   IBS (irritable bowel syndrome)    C/D   Localized swelling of both lower legs    go down with propping up of legs   Morbid obesity ([place])    Osteoarthritis    oa   Pneumonia 09/2019   PONV (postoperative nausea and vomiting)    watch neck position needs support or gets n/v   Thyroid nodule    sees ent dr Radene Journey for q year   Uterine polyp     Surgeries: Procedure(s): RIGHT TOTAL HIP ARTHROPLASTY ANTERIOR APPROACH on 06/17/2021   Consultants:   Discharged Condition: Improved  Hospital Course: Leslie Knapp is an 70 y.o. female who was admitted 06/17/2021 for operative treatment ofUnilateral primary osteoarthritis, right hip. Patient has severe unremitting pain that affects sleep, daily activities, and work/hobbies. After pre-op clearance the patient was taken to the operating room on 06/17/2021 and underwent  Procedure(s): RIGHT TOTAL HIP ARTHROPLASTY ANTERIOR APPROACH.    Patient was given perioperative antibiotics:  Anti-infectives (From admission, onward)    Start      Dose/Rate Route Frequency Ordered Stop   06/17/21 1700  ceFAZolin (ANCEF) IVPB 2g/100 mL premix        2 g 200 mL/hr over 30 Minutes Intravenous Every 6 hours 06/17/21 1355 06/18/21 0659   06/17/21 0815  ceFAZolin (ANCEF) IVPB 3g/100 mL premix        3 g 200 mL/hr over 30 Minutes Intravenous On call to O.R. 06/17/21 0808 06/17/21 1044        Patient was given sequential compression devices, early ambulation, and chemoprophylaxis to prevent DVT.  Patient benefited maximally from hospital stay and there were no complications.    Recent vital signs: Patient Vitals for the past 24 hrs:  BP Temp Temp src Pulse Resp SpO2  06/20/21 0505 115/67 99.1 F (37.3 C) Oral 83 20 93 %  06/19/21 2111 113/71 99.5 F (37.5 C) Oral 81 20 95 %  06/19/21 1409 111/66 99 F (37.2 C) Oral 82 18 93 %     Recent laboratory studies:  Recent Labs    06/18/21 0333  WBC 10.6*  HGB 13.5  HCT 40.1  PLT 178  NA 138  K 4.1  CL 106  CO2 23  BUN 10  CREATININE 0.52  GLUCOSE 142*  CALCIUM 8.7*     Discharge Medications:   Allergies as of 06/20/2021       Reactions   Celebrex [celecoxib] Swelling, Rash   Lactose Intolerance (gi) Other (See Comments)        Medication  List     TAKE these medications    apixaban 2.5 MG Tabs tablet Commonly known as: ELIQUIS Take 1 tablet (2.5 mg total) by mouth 2 (two) times daily.   Cholecalciferol 25 MCG/0.03ML Liqd Take 1,000 Units by mouth once a week.   Enzyme Digest Caps Take 1 capsule by mouth daily as needed (heartburn).   HYDROcodone-acetaminophen 5-325 MG tablet Commonly known as: NORCO/VICODIN Take 1-2 tablets by mouth every 6 (six) hours as needed for moderate pain.   methocarbamol 500 MG tablet Commonly known as: ROBAXIN Take 1 tablet (500 mg total) by mouth every 6 (six) hours as needed for muscle spasms.   MINERALS PO Take 1 capsule by mouth daily.   ondansetron 4 MG tablet Commonly known as: Zofran Take 1 tablet (4 mg total)  by mouth every 8 (eight) hours as needed for nausea or vomiting.               Durable Medical Equipment  (From admission, onward)           Start     Ordered   06/17/21 1640  DME 3 n 1  Once        06/17/21 1639   06/17/21 1640  DME Walker rolling  Once       Question Answer Comment  Walker: With 5 Inch Wheels   Patient needs a walker to treat with the following condition Status post total replacement of right hip      06/17/21 1639            Diagnostic Studies: CT CARDIAC SCORING (SELF PAY ONLY)  Addendum Date: 06/15/2021   ADDENDUM REPORT: 06/15/2021 16:07 CLINICAL DATA:  Cardiovascular Disease Risk stratification EXAM: Coronary Calcium Score TECHNIQUE: A gated, non-contrast computed tomography scan of the heart was performed using 30mm slice thickness. Axial images were analyzed on a dedicated workstation. Calcium scoring of the coronary arteries was performed using the Agatston method. FINDINGS: Coronary arteries: Normal origins. Coronary Calcium Score: Left main: 0 Left anterior descending artery: 97 Left circumflex artery: 3 Right coronary artery: 0 Total: 100 Percentile: 79 Pericardium: Normal. Aorta: Normal caliber of ascending aorta. Aortic atherosclerosis noted. Presence of atrial septal occluder. Non-cardiac: See separate report from [facility]. IMPRESSION: Coronary calcium score of 100. This was 79th percentile for age-, race-, and sex-matched controls. Aortic atherosclerosis. Presence of atrial septal occluder. RECOMMENDATIONS: Coronary artery calcium (CAC) score is a strong predictor of incident coronary heart disease (CHD) and provides predictive information beyond traditional risk factors. CAC scoring is reasonable to use in the decision to withhold, postpone, or initiate statin therapy in intermediate-risk or selected borderline-risk asymptomatic adults (age 19-75 years and LDL-C >=70 to <190 mg/dL) who do not have diabetes or established atherosclerotic  cardiovascular disease (ASCVD).* In intermediate-risk (10-year ASCVD risk >=7.5% to <20%) adults or selected borderline-risk (10-year ASCVD risk >=5% to <7.5%) adults in whom a CAC score is measured for the purpose of making a treatment decision the following recommendations have been made: If CAC=0, it is reasonable to withhold statin therapy and reassess in 5 to 10 years, as long as higher risk conditions are absent (diabetes mellitus, family history of premature CHD in first degree relatives (males <55 years; females <65 years), cigarette smoking, or LDL >=190 mg/dL). If CAC is 1 to 99, it is reasonable to initiate statin therapy for patients >=40 years of age. If CAC is >=100 or >=75th percentile, it is reasonable to initiate statin therapy at any age. Cardiology  referral should be considered for patients with CAC scores >=400 or >=75th percentile. *2018 AHA/ACC/AACVPR/AAPA/ABC/ACPM/ADA/AGS/APhA/ASPC/NLA/PCNA Guideline on the Management of Blood Cholesterol: A Report of the American College of Cardiology/American Heart Association Task Force on Clinical Practice Guidelines. J Am Coll Cardiol. 2019;73(24):3168-3209. Buford Dresser, MD Electronically Signed   By: Buford Dresser M.D.   On: 06/15/2021 16:07   Result Date: 06/15/2021 EXAM: OVER-READ INTERPRETATION  CT CHEST The following report is an over-read performed by radiologist Dr. Rolm Baptise of [facility], [place] on 06/15/2021. This over-read does not include interpretation of cardiac or coronary anatomy or pathology. The coronary calcium score interpretation by the cardiologist is attached. COMPARISON:  10/03/2019 FINDINGS: Vascular: Heart is normal size. Scattered aortic calcifications. No aneurysm. Mediastinum/Nodes: No adenopathy Lungs/Pleura: No confluent opacities or effusions. Upper Abdomen: Imaging into the upper abdomen demonstrates no acute findings. Musculoskeletal: Chest wall soft tissues are unremarkable. No acute bony  abnormality. IMPRESSION: No acute extra cardiac abnormality. Aortic atherosclerosis. Electronically Signed: By: Rolm Baptise M.D. On: 06/15/2021 14:03   DG Pelvis Portable  Result Date: 06/17/2021 CLINICAL DATA:  Status post right hip replacement. EXAM: PORTABLE PELVIS 1-2 VIEWS COMPARISON:  Intraoperative fluoroscopic images earlier today FINDINGS: Sequelae of right total hip arthroplasty are again identified. The prosthetic components appear normally aligned on these AP images. Postoperative gas is noted in the adjacent soft tissues. No acute fracture is identified. There is advanced left hip osteoarthrosis. IMPRESSION: Right total hip arthroplasty without evidence of acute complication. Electronically Signed   By: Logan Bores M.D.   On: 06/17/2021 14:28   DG C-Arm 1-60 Min-No Report  Result Date: 06/17/2021 Fluoroscopy was utilized by the requesting physician.  No radiographic interpretation.   DG C-Arm 1-60 Min-No Report  Result Date: 06/17/2021 Fluoroscopy was utilized by the requesting physician.  No radiographic interpretation.   ECHOCARDIOGRAM COMPLETE  Result Date: 06/15/2021    ECHOCARDIOGRAM REPORT   Patient Name:   Leslie Knapp Date of Exam: 06/15/2021 Medical Rec #:  [id number]       Height:       69.0 in Accession #:    [id number]      Weight:       295.0 lb Date of Birth:  1951/02/04       BSA:          2.437 m Patient Age:    38 years        BP:           126/80 mmHg Patient Gender: F               HR:           90 bpm. Exam Location:  [facility] Procedure: 2D Echo, 3D Echo, Cardiac Doppler and Color Doppler Indications:    Z01.818 Pre-Operative Evaluation  History:        Patient has prior history of Echocardiogram examinations, most                 recent 11/23/2017. Abnormal ECG, Arrythmias:RBBB,                 Signs/Symptoms:Edema and Fatigue; Risk Factors:Dyslipidemia.                 Pre-Operative Eval for Right Hip Replacement (scheduled for                 Friday Dec.  9th), PFO with ASA status post PFO Closure (30 mm  Amplatzer Device- 03-24-2019), Obesity, Acute Saddle Pulmonary                 Embolism (2020).  Sonographer:    Deliah Boston RDCS Referring Phys: MICHAEL HILTS IMPRESSIONS  1. Left ventricular ejection fraction, by estimation, is 60 to 65%. Left ventricular ejection fraction by 3D volume is 63 %. The left ventricle has normal function. The left ventricle has no regional wall motion abnormalities. There is mild concentric left ventricular hypertrophy. Left ventricular diastolic parameters are consistent with Grade I diastolic dysfunction (impaired relaxation).  2. Right ventricular systolic function is normal. The right ventricular size is normal. Tricuspid regurgitation signal is inadequate for assessing PA pressure.  3. The mitral valve is normal in structure. Trivial mitral valve regurgitation. No evidence of mitral stenosis.  4. The aortic valve is tricuspid. There is mild thickening of the aortic valve. Aortic valve regurgitation is trivial. Aortic valve sclerosis is present, with no evidence of aortic valve stenosis.  5. The inferior vena cava is normal in size with greater than 50% respiratory variability, suggesting right atrial pressure of 3 mmHg. Comparison(s): Compared to prior echo in 2019, there is no significant change. FINDINGS  Left Ventricle: Left ventricular ejection fraction, by estimation, is 60 to 65%. Left ventricular ejection fraction by 3D volume is 63 %. The left ventricle has normal function. The left ventricle has no regional wall motion abnormalities. The left ventricular internal cavity size was normal in size. There is mild concentric left ventricular hypertrophy. Left ventricular diastolic parameters are consistent with Grade I diastolic dysfunction (impaired relaxation). Right Ventricle: The right ventricular size is normal. No increase in right ventricular wall thickness. Right ventricular systolic function is  normal. Tricuspid regurgitation signal is inadequate for assessing PA pressure. Left Atrium: Left atrial size was normal in size. Right Atrium: Right atrial size was normal in size. Pericardium: There is no evidence of pericardial effusion. Mitral Valve: The mitral valve is normal in structure. Mild mitral annular calcification. Trivial mitral valve regurgitation. No evidence of mitral valve stenosis. Tricuspid Valve: The tricuspid valve is normal in structure. Tricuspid valve regurgitation is trivial. Aortic Valve: The aortic valve is tricuspid. There is mild thickening of the aortic valve. Aortic valve regurgitation is trivial. Aortic valve sclerosis is present, with no evidence of aortic valve stenosis. Pulmonic Valve: The pulmonic valve was not well visualized. Pulmonic valve regurgitation is trivial. Aorta: The aortic root and ascending aorta are structurally normal, with no evidence of dilitation. Venous: The inferior vena cava is normal in size with greater than 50% respiratory variability, suggesting right atrial pressure of 3 mmHg. IAS/Shunts: No atrial level shunt detected by color flow Doppler.  LEFT VENTRICLE PLAX 2D LVIDd:         4.27 cm         Diastology LVIDs:         2.60 cm         LV e' medial:    7.18 cm/s LV PW:         0.90 cm         LV E/e' medial:  9.6 LV IVS:        0.80 cm         LV e' lateral:   7.77 cm/s                                LV E/e' lateral: 8.9  3D Volume EF                                LV 3D EF:    Left                                             ventricul                                             ar                                             ejection                                             fraction                                             by 3D                                             volume is                                             63 %.                                 3D Volume EF:                                3D EF:         63 %                                LV EDV:       160 ml                                LV ESV:       59 ml                                LV SV:        101 ml RIGHT VENTRICLE RV S prime:     15.10 cm/s TAPSE (M-mode): 1.8 cm LEFT ATRIUM             Index        RIGHT ATRIUM  Index LA diam:        3.90 cm 1.60 cm/m   RA Area:     16.00 cm LA Vol (A2C):   61.1 ml 25.07 ml/m  RA Volume:   42.50 ml  17.44 ml/m LA Vol (A4C):   49.0 ml 20.11 ml/m LA Biplane Vol: 57.5 ml 23.59 ml/m  AORTIC VALVE LVOT Vmax:   109.40 cm/s LVOT Vmean:  73.780 cm/s LVOT VTI:    0.217 m  AORTA Ao Asc diam: 3.10 cm MITRAL VALVE MV Area (PHT): cm         SHUNTS MV Decel Time: 201 msec    Systemic VTI: 0.22 m MV E velocity: 68.97 cm/s MV A velocity: 84.37 cm/s MV E/A ratio:  0.82 Gwyndolyn Kaufman MD Electronically signed by Gwyndolyn Kaufman MD Signature Date/Time: 06/15/2021/4:09:52 PM    Final    DG HIP OPERATIVE UNILAT W OR W/O PELVIS RIGHT  Result Date: 06/17/2021 CLINICAL DATA:  Arthroplasty EXAM: OPERATIVE RIGHT HIP (WITH PELVIS IF PERFORMED) multiple VIEWS TECHNIQUE: Fluoroscopic spot image(s) were submitted for interpretation post-operatively. COMPARISON:  01/11/2021 FINDINGS: RIGHT hip total arthroplasty. No complicating features. Expected soft tissue change IMPRESSION: No complication following RIGHT hip arthroplasty Electronically Signed   By: Suzy Bouchard M.D.   On: 06/17/2021 13:31    Disposition: Discharge disposition: 01-Home or [place]     Mcarthur Rossetti, MD Follow up on 06/30/2021.   Specialty: Orthopedic Surgery Contact information: [address] [place] [address] [phone]                  Signed: Mcarthur Rossetti 06/20/2021, 11:15 AM

## 2021-06-20 NOTE — TOC Transition Note (Signed)
Transition of Care [facility]) - CM/SW Discharge Note   Patient Details  Name: Leslie Knapp MRN: [id number] Date of Birth: 22-Dec-1950  [facility] CM/SW Contact:  Lennart Pall, LCSW Phone Number: 06/20/2021, 11:44 AM   Clinical Narrative:    Met with pt this morning who is very pleased with her progress and ready for dc home today. She is agreeable with recommendations for HHPT.  Has had [facility] in the past - referral placed with them.  Has all needed DME at home.  No further TOC needs.   Final next level of care: Home w Home Health Services Barriers to Discharge: Barriers Resolved   Patient Goals and CMS Choice Patient states their goals for this hospitalization and ongoing recovery are:: return home      Discharge Placement                       Discharge Plan and Services   Discharge Planning Services: CM Consult            DME Arranged: N/A DME Agency: NA       HH Arranged: PT HH Agency: [place] ([facility]) Date HH Agency Contacted: 06/20/21 Time [place]: [id number] Representative spoke with at [place]: [place] Determinants of Health ([place]) Interventions     Readmission Risk Interventions No flowsheet data found.

## 2021-06-20 NOTE — Progress Notes (Signed)
Patient ID: Leslie Knapp, female   DOB: [birth year], 70 y.o.   MRN: [id number] The patient's vital signs are stable and her right operative hip is stable.  She has been very slow to mobilize.  The goal is getting her home soon with home health therapy.  She says she has her sister at home who has a nursing background and she is hired someone else.  She will have other family members coming in for the holidays.  We will see how she does this morning to determine will she be able to be discharged home this afternoon versus tomorrow.

## 2021-06-22 ENCOUNTER — Telehealth: Payer: Self-pay | Admitting: Physician Assistant

## 2021-06-22 NOTE — Telephone Encounter (Signed)
Called and advised.

## 2021-06-22 NOTE — Telephone Encounter (Signed)
Received call from Evelena Asa) with [facility] needing verbal orders for HHPT 2 Wk 4. The number to contact [facility] is [phone]

## 2021-06-23 ENCOUNTER — Encounter (HOSPITAL_COMMUNITY): Payer: Self-pay | Admitting: Orthopaedic Surgery

## 2021-06-23 ENCOUNTER — Other Ambulatory Visit: Payer: Self-pay

## 2021-06-23 ENCOUNTER — Emergency Department (HOSPITAL_BASED_OUTPATIENT_CLINIC_OR_DEPARTMENT_OTHER)
Admission: EM | Admit: 2021-06-23 | Discharge: 2021-06-23 | Disposition: A | Discharge status: Home / Self Care | Payer: Medicare Other | Attending: Emergency Medicine | Admitting: Emergency Medicine

## 2021-06-23 ENCOUNTER — Emergency Department (HOSPITAL_BASED_OUTPATIENT_CLINIC_OR_DEPARTMENT_OTHER): Payer: Medicare Other

## 2021-06-23 DIAGNOSIS — Z8616 Personal history of COVID-19: Secondary | ICD-10-CM | POA: Diagnosis not present

## 2021-06-23 DIAGNOSIS — K5939 Other megacolon: Secondary | ICD-10-CM | POA: Diagnosis not present

## 2021-06-23 DIAGNOSIS — Z96653 Presence of artificial knee joint, bilateral: Secondary | ICD-10-CM | POA: Diagnosis not present

## 2021-06-23 DIAGNOSIS — I700 Atherosclerosis of aorta: Secondary | ICD-10-CM | POA: Diagnosis not present

## 2021-06-23 DIAGNOSIS — K449 Diaphragmatic hernia without obstruction or gangrene: Secondary | ICD-10-CM | POA: Diagnosis not present

## 2021-06-23 DIAGNOSIS — Z7901 Long term (current) use of anticoagulants: Secondary | ICD-10-CM | POA: Insufficient documentation

## 2021-06-23 DIAGNOSIS — K5730 Diverticulosis of large intestine without perforation or abscess without bleeding: Secondary | ICD-10-CM | POA: Diagnosis not present

## 2021-06-23 DIAGNOSIS — R109 Unspecified abdominal pain: Secondary | ICD-10-CM | POA: Diagnosis not present

## 2021-06-23 DIAGNOSIS — K5900 Constipation, unspecified: Secondary | ICD-10-CM | POA: Insufficient documentation

## 2021-06-23 DIAGNOSIS — R1084 Generalized abdominal pain: Secondary | ICD-10-CM | POA: Diagnosis not present

## 2021-06-23 DIAGNOSIS — R112 Nausea with vomiting, unspecified: Secondary | ICD-10-CM | POA: Insufficient documentation

## 2021-06-23 DIAGNOSIS — Z96641 Presence of right artificial hip joint: Secondary | ICD-10-CM | POA: Insufficient documentation

## 2021-06-23 DIAGNOSIS — K6389 Other specified diseases of intestine: Secondary | ICD-10-CM | POA: Diagnosis not present

## 2021-06-23 LAB — COMPREHENSIVE METABOLIC PANEL
ALT: 22 U/L (ref 0–44)
AST: 25 U/L (ref 15–41)
Albumin: 3 g/dL — ABNORMAL LOW (ref 3.5–5.0)
Alkaline Phosphatase: 91 U/L (ref 38–126)
Anion gap: 9 (ref 5–15)
BUN: 12 mg/dL (ref 8–23)
CO2: 27 mmol/L (ref 22–32)
Calcium: 8.8 mg/dL — ABNORMAL LOW (ref 8.9–10.3)
Chloride: 100 mmol/L (ref 98–111)
Creatinine, Ser: 0.57 mg/dL (ref 0.44–1.00)
GFR, Estimated: >60 mL/min (ref >60)
Glucose, Bld: 115 mg/dL — ABNORMAL HIGH (ref 70–99)
Potassium: 3.9 mmol/L (ref 3.5–5.1)
Sodium: 136 mmol/L (ref 135–145)
Total Bilirubin: 0.8 mg/dL (ref 0.3–1.2)
Total Protein: 6.7 g/dL (ref 6.5–8.1)

## 2021-06-23 LAB — CBC WITH DIFFERENTIAL/PLATELET
Abs Immature Granulocytes: 0.03 10*3/uL (ref 0.00–0.07)
Basophils Absolute: 0 10*3/uL (ref 0.0–0.1)
Basophils Relative: 0 %
Eosinophils Absolute: 0.1 10*3/uL (ref 0.0–0.5)
Eosinophils Relative: 1 %
HCT: 43.3 % (ref 36.0–46.0)
Hemoglobin: 14.8 g/dL (ref 12.0–15.0)
Immature Granulocytes: 0 %
Lymphocytes Relative: 18 %
Lymphs Abs: 1.9 10*3/uL (ref 0.7–4.0)
MCH: 30.1 pg (ref 26.0–34.0)
MCHC: 34.2 g/dL (ref 30.0–36.0)
MCV: 88 fL (ref 80.0–100.0)
Monocytes Absolute: 0.8 10*3/uL (ref 0.1–1.0)
Monocytes Relative: 8 %
Neutro Abs: 7.6 10*3/uL (ref 1.7–7.7)
Neutrophils Relative %: 73 %
Platelets: 272 10*3/uL (ref 150–400)
RBC: 4.92 MIL/uL (ref 3.87–5.11)
RDW: 12.6 % (ref 11.5–15.5)
WBC: 10.4 10*3/uL (ref 4.0–10.5)
nRBC: 0 % (ref 0.0–0.2)

## 2021-06-23 MED ORDER — IOHEXOL 300 MG/ML  SOLN
125.0000 mL | Freq: Once | INTRAMUSCULAR | Status: AC | PRN
  Administered 2021-06-23: 125 mL via INTRAVENOUS

## 2021-06-23 MED ORDER — FLEET ENEMA 7-19 GM/118ML RE ENEM
1.0000 | ENEMA | Freq: Once | RECTAL | Status: AC
  Administered 2021-06-23: 1 via RECTAL
  Filled 2021-06-23: qty 1

## 2021-06-23 MED ORDER — FLEET ENEMA 7-19 GM/118ML RE ENEM: 1.0000 | ENEMA | Freq: Once | RECTAL | Status: DC

## 2021-06-23 MED ORDER — MAGNESIUM CITRATE PO SOLN: 1.0000 | Freq: Once | ORAL | 0 refills | Status: AC

## 2021-06-23 MED ORDER — ONDANSETRON HCL 4 MG PO TABS: 4.0000 mg | ORAL_TABLET | Freq: Four times a day (QID) | ORAL | 0 refills | Status: DC

## 2021-06-23 MED ORDER — POLYETHYLENE GLYCOL 3350 17 G PO PACK: 17.0000 g | PACK | Freq: Two times a day (BID) | ORAL | 0 refills | Status: DC

## 2021-06-23 MED ORDER — SODIUM CHLORIDE 0.9 % IV BOLUS
1000.0000 mL | Freq: Once | INTRAVENOUS | Status: AC
  Administered 2021-06-23: 1000 mL via INTRAVENOUS

## 2021-06-23 MED ORDER — SENNOSIDES-DOCUSATE SODIUM 8.6-50 MG PO TABS: 1.0000 | ORAL_TABLET | Freq: Every day | ORAL | 0 refills | Status: DC

## 2021-06-23 NOTE — ED Notes (Signed)
Pt transported to Xray via stretcher at this time.  ?

## 2021-06-23 NOTE — ED Notes (Signed)
Asked PT for urine sample, PT stated she could not go at this time.

## 2021-06-23 NOTE — ED Triage Notes (Signed)
Pt BIB [facility] from home C/O constipation, abd pain, and dark urine. Pt reports having L total hip replacement 1 week ago. Last BM the day of surgery. Pt reports taking Colace, Miralax, and MOM without relief.

## 2021-06-23 NOTE — ED Provider Notes (Signed)
[facility] Provider Note   CSN: [id number] Arrival date & time: 06/23/21  1426     History Chief Complaint  Patient presents with   Constipation    Leslie Knapp is a 70 y.o. female.  With pertinent past medical history of right hip replacement Friday, 06/17/2021, who presents to the emergency department with complaints of constipation.  She states that after hip replacement on Friday she was given Norco which she said made her upset on her stomach.  She states that she was discharged on Monday without having a bowel movement.  She states that she called her physician who requested that she do Colace, MiraLAX and milk of magnesium.  She states that she tried all of these without relief of symptoms.  She states that her sister is a nurse who disimpacted her on Tuesday and Wednesday with hard stool.  However she did not have a bowel movement after she was disimpacted.  She states that this morning her sister attempted to disimpact her but there was no stool present.  She endorses nausea and vomiting.  Endorses intermittent sharp abdominal pain that is quick.  Denies fevers.  She states that she is passing gas but has not had a full bowel movement since surgery.  She is also worried about being dehydrated as her urine has been darker.  She states that her primary care was concerned about her having a UTI and that infection traveling to her hip.  She is up and walking at home about 35 feet a day.  Surgical site has been well-healing and she is not having pain in the hip.   Constipation Associated symptoms: abdominal pain, nausea and vomiting   Associated symptoms: no dysuria and no fever       Past Medical History:  Diagnosis Date   Acute saddle pulmonary embolism ([place]) [id number]   Complication of anesthesia    wants neck supported during surgery due to hx of migraine caused by concussion   Concussion 2010, 2000 and 1998   periodiotic migraine and occ  dizzines   COVID-19 09/2019   sx covid pneumonia,fever, chills body aches, took monoclonal antibody tx done symptoms resolved after 6 weeks   CTS (carpal tunnel syndrome)    Fatty liver disease, nonalcoholic    In [facility] liver biopsy 2010 did not show fatty liver   Headache    History of blood transfusion 1975   after knee surgery   IBS (irritable bowel syndrome)    C/D   Localized swelling of both lower legs    go down with propping up of legs   Morbid obesity ([place])    Osteoarthritis    oa   Pneumonia 09/2019   PONV (postoperative nausea and vomiting)    watch neck position needs support or gets n/v   Thyroid nodule    sees ent dr Radene Journey for q year   Uterine polyp     Patient Active Problem List   Diagnosis Date Noted   Status post total hip replacement, right 06/17/2021   Unilateral primary osteoarthritis, right hip 06/16/2021   Right bundle branch block 06/15/2021   Preoperative clearance 06/15/2021   Hyperlipidemia 06/15/2021   Posterior vitreous detachment of left eye 09/13/2020   Nuclear sclerotic cataract of both eyes 09/13/2020   Thickened endometrium 04/07/2020   Vaginal discharge 04/07/2020   S/P insertion of IVC (inferior vena caval) filter 06/17/2019   PFO with atrial septal aneurysm 03/21/2019   Aortic atherosclerosis (  [place]) 03/17/2019   Brachial artery occlusion, 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   Abdominal pain 03/08/2019   Pure hypercholesterolemia 09/25/2017   Morbid obesity ([place]) 06/07/2017   Encounter for Medicare annual wellness exam 06/07/2017   Vitamin D deficiency 06/07/2017   IBS (irritable bowel syndrome) 08/29/2016   Migraine 08/29/2016   Disorder of musculoskeletal system 03/23/2015   Eye exam abnormal 03/23/2015   NASH (nonalcoholic steatohepatitis) 10/19/2011    Past Surgical History:  Procedure Laterality Date   CARDIAC CATHETERIZATION  03/21/2019   with pfo closure   HYSTEROSCOPY WITH D & C N/A 04/06/2020   Procedure: DILATATION  AND CURETTAGE /HYSTEROSCOPY;  Surgeon: Lavonia Drafts, MD;  Location: [facility];  Service: Gynecology;  Laterality: N/A;   IVC FILTER INSERTION  07/07/2019   KNEE SURGERY  1975   Left Patella Tendon Replacement   LIVER BIOPSY  2010   PATENT FORAMEN OVALE(PFO) CLOSURE  03/21/2019   right arm blood clot removal     TONSILLECTOMY  1963   TOTAL HIP ARTHROPLASTY Right 06/17/2021   Procedure: RIGHT TOTAL HIP ARTHROPLASTY ANTERIOR APPROACH;  Surgeon: Mcarthur Rossetti, MD;  Location: [facility];  Service: Orthopedics;  Laterality: Right;   TOTAL KNEE ARTHROPLASTY  12/2003   Left   TOTAL KNEE ARTHROPLASTY  05/2004   Right   UTERINE POLYP REMOVAL  2010     OB History     Gravida  0   Para  0   Term  0   Preterm  0   AB  0   Living  0      SAB  0   IAB  0   Ectopic  0   Multiple  0   Live Births  0           Family History  Problem Relation Age of Onset   Stroke Mother 50       Deceased   Hypertension Mother    GI problems Mother    Arthritis Mother    Dementia Mother    Heart defect Father 26       Deceased   Parkinson's disease Father    Varicose Veins Father    Stroke Maternal Grandfather    Arthritis Maternal Grandfather    Kidney failure Maternal Grandmother    Gallbladder disease Maternal Grandmother    Diverticulitis Sister        #1   Arthritis Sister    Healthy Sister        #2    Social History   Tobacco Use   Smoking status: Never   Smokeless tobacco: Never  Vaping Use   Vaping Use: Never used  Substance Use Topics   Alcohol use: No    Alcohol/week: 0.0 standard drinks   Drug use: No    Home Medications Prior to Admission medications   Medication Sig Start Date End Date Taking? Authorizing Provider  apixaban (ELIQUIS) 2.5 MG TABS tablet Take 1 tablet (2.5 mg total) by mouth 2 (two) times daily. 03/04/21   Volanda Napoleon, MD  Cholecalciferol 25 MCG/0.03ML LIQD Take 1,000 Units by mouth once a week.     [provider]  Digestive Enzymes (ENZYME DIGEST) CAPS Take 1 capsule by mouth daily as needed (heartburn).    [provider]  HYDROcodone-acetaminophen (NORCO/VICODIN) 5-325 MG tablet Take 1-2 tablets by mouth every 6 (six) hours as needed for moderate pain. 06/18/21   Mcarthur Rossetti, MD  methocarbamol (ROBAXIN) 500 MG tablet Take  1 tablet (500 mg total) by mouth every 6 (six) hours as needed for muscle spasms. 06/18/21   Mcarthur Rossetti, MD  ondansetron (ZOFRAN) 4 MG tablet Take 1 tablet (4 mg total) by mouth every 8 (eight) hours as needed for nausea or vomiting. 06/20/21   Mcarthur Rossetti, MD  Trace Min [id number] (MINERALS PO) Take 1 capsule by mouth daily.    [provider]    Allergies    Celebrex [celecoxib] and Lactose intolerance (gi)  Review of Systems   Review of Systems  Constitutional:  Positive for appetite change. Negative for chills and fever.  Respiratory:  Negative for shortness of breath.   Gastrointestinal:  Positive for abdominal pain, constipation, nausea and vomiting.  Genitourinary:  Positive for decreased urine volume. Negative for dysuria and flank pain.  All other systems reviewed and are negative.  Physical Exam Updated Vital Signs BP 140/87 (BP Location: Right Wrist)    Pulse 89    Temp 99.2 F (37.3 C) (Oral)    Resp 16    Ht 5\' 9"  (1.753 m)    Wt 133.4 kg    SpO2 95%    BMI 43.42 kg/m   Physical Exam Vitals and nursing note reviewed.  Constitutional:      General: She is not in acute distress.    Appearance: Normal appearance. She is obese. She is not ill-appearing or toxic-appearing.  HENT:     Head: Normocephalic and atraumatic.     Nose: Nose normal.     Mouth/Throat:     Mouth: Mucous membranes are moist.     Pharynx: Oropharynx is clear.  Eyes:     General: No scleral icterus.    Extraocular Movements: Extraocular movements intact.     Pupils: Pupils are equal, round, and  reactive to light.  Cardiovascular:     Rate and Rhythm: Normal rate and regular rhythm.     Pulses: Normal pulses.     Heart sounds: No murmur heard. Pulmonary:     Effort: Pulmonary effort is normal. No respiratory distress.     Breath sounds: Normal breath sounds.  Abdominal:     General: Abdomen is protuberant. Bowel sounds are normal. There is no distension.     Palpations: Abdomen is soft.     Tenderness: There is abdominal tenderness in the epigastric area. There is no guarding.  Musculoskeletal:        General: No swelling or tenderness.     Cervical back: Normal range of motion and neck supple.     Left hip: Normal.     Comments: Right hip with postoperative dressing.  There appears to be no redness, swelling or pain to the area.  Skin:    General: Skin is warm and dry.     Capillary Refill: Capillary refill takes less than 2 seconds.     Findings: No erythema.  Neurological:     General: No focal deficit present.     Mental Status: She is alert and oriented to person, place, and time. Mental status is at baseline.  Psychiatric:        Mood and Affect: Mood normal.        Behavior: Behavior normal.        Thought Content: Thought content normal.        Judgment: Judgment normal.    ED Results / Procedures / Treatments   Labs (all labs ordered are listed, but only abnormal results are displayed) Labs Reviewed  COMPREHENSIVE METABOLIC PANEL - Abnormal; Notable for the following components:      Result Value   Glucose, Bld 115 (*)    Calcium 8.8 (*)    Albumin 3.0 (*)    All other components within normal limits  CBC WITH DIFFERENTIAL/PLATELET  URINALYSIS, ROUTINE W REFLEX MICROSCOPIC   EKG None  Radiology CT ABDOMEN PELVIS W CONTRAST  Result Date: 06/23/2021 CLINICAL DATA:  Bowel obstruction suspected EXAM: CT ABDOMEN AND PELVIS WITH CONTRAST TECHNIQUE: Multidetector CT imaging of the abdomen and pelvis was performed using the standard protocol following  bolus administration of intravenous contrast. CONTRAST:  140mL OMNIPAQUE IOHEXOL 300 MG/ML  SOLN COMPARISON:  03/08/2019 FINDINGS: Lower chest: Lung bases are clear. Hepatobiliary: Liver is within normal limits. Layering gallbladder sludge. No associated inflammatory changes. No intrahepatic or extrahepatic ductal dilatation. Pancreas: Within normal limits. Spleen: Within normal limits. Adrenals/Urinary Tract: Adrenal glands are within normal limits. Kidneys are within normal limits.  No hydronephrosis. Bladder is within normal limits. Stomach/Bowel: Stomach is notable for a tiny hiatal hernia. No evidence of bowel obstruction. Normal appendix (series 2/image 65). Left colonic diverticulosis, without evidence of diverticulitis. Right colon is mildly distended, possibly reflecting adynamic colonic ileus. Vascular/Lymphatic: No evidence of abdominal aortic aneurysm. Atherosclerotic calcifications of the abdominal aorta and branch vessels. No suspicious abdominopelvic lymphadenopathy. Reproductive: Uterus and bilateral ovaries are unremarkable. Other: Trace pelvic ascites. Musculoskeletal: Degenerative changes of the lower thoracic spine. Right hip arthroplasty, without evidence of complication. Moderate degenerative changes of the left hip. IMPRESSION: Left colonic diverticulosis, without evidence of diverticulitis. No evidence of bowel obstruction. Normal appendix. Mild dilatation of the right colon, possibly reflecting adynamic colonic ileus. Layering gallbladder sludge, without associated inflammatory changes. Electronically Signed   By: Julian Hy M.D.   On: 06/23/2021 19:56   DG Abd Acute W/Chest  Result Date: 06/23/2021 CLINICAL DATA:  Provided history: Constipation, abdominal pain, dark urine. EXAM: DG ABDOMEN ACUTE WITH 1 VIEW CHEST COMPARISON:  CT abdomen/pelvis 03/08/2019. Abdominal radiographs 03/07/2019. CT chest 10/03/2019. Prior chest radiographs 10/01/2019 and earlier. FINDINGS: Heart size  within normal limits. Aortic atherosclerosis. No appreciable airspace consolidation. No evidence of pleural effusion or pneumothorax. The right colon is mildly distended and there is an air-fluid level at what appears to be the hepatic flexure. No dilated loops of small bowel are identified. Large stool burden within the sigmoid colon and rectum. Degenerative changes of the spine. Prior right total hip arthroplasty. Advanced degenerative changes of the left femoroacetabular joint. IMPRESSION: Large stool burden within the sigmoid colon and rectum. The right colon is mildly distended, and there is an air-fluid level at what appears to be the hepatic flexure. Findings suggest constipation/impaction, and are consistent with the provided history. No evidence of acute cardiopulmonary abnormality. Aortic Atherosclerosis (ICD10-I70.0). Advanced degenerative changes of the left femoroacetabular joint. Electronically Signed   By: Kellie Simmering D.O.   On: 06/23/2021 16:24    Procedures Fecal disimpaction  Date/Time: 06/23/2021 8:03 PM Performed by: Mickie Hillier, PA-C Authorized by: Mickie Hillier, PA-C  Consent: Verbal consent obtained. Risks and benefits: risks, benefits and alternatives were discussed Consent given by: patient Patient understanding: patient states understanding of the procedure being performed Patient consent: the patient's understanding of the procedure matches consent given Procedure consent: procedure consent matches procedure scheduled Relevant documents: relevant documents present and verified Test results: test results available and properly labeled Site marked: the operative site was marked Imaging studies: imaging studies available Local anesthesia used: no  Anesthesia:  Local anesthesia used: no  Sedation: Patient sedated: no  Patient tolerance: patient tolerated the procedure well with no immediate complications Comments: Moderate volume hard stool disimpacted.     Medications Ordered in ED Medications  iohexol (OMNIPAQUE) 300 MG/ML solution 125 mL (has no administration in time range)  sodium chloride 0.9 % bolus 1,000 mL (0 mLs Intravenous Stopped 06/23/21 1805)  sodium phosphate (FLEET) 7-19 GM/118ML enema 1 enema (1 enema Rectal Given 06/23/21 1725)  -Soapsuds enema  ED Course  I have reviewed the triage vital signs and the nursing notes.  Pertinent labs & imaging results that were available during my care of the patient were reviewed by me and considered in my medical decision making (see chart for details).    MDM Rules/Calculators/A&P 70 year old female who presents emergency department with constipation.  X-ray abdomen acute without small bowel obstruction, does show large volume stool in the sigmoid and rectum. I personally digitally disimpacted the patient.  There was a moderate volume of hard stool.  We will proceed with enema now that she is disimpacted. -Given Fleet enema with minimal output.  She did have some hard stool removed during Fleet enema. -Attempt soapsuds enema with only mucus like output per RN.  Given that we have had no progression despite disimpacting and enema will CT scan abdomen to make sure there is no acute obstruction not seen on plain film. -CT shows no evidence of bowel obstruction.  There is mild dilation of the right colon possibly reflecting adynamic colonic ileus. -P.o. trial here in the emergency department without vomiting. Again she has a nonperitoneal abdomen.  Her abdomen is soft and nontender to palpation.  She does have a moderate to large volume of stool in the left colon.  Will prescribe her multiple laxatives and stool softeners for her to take until she is having loose stool.  She given back off of medications afterward.  We will also prescribe her Zofran for nausea.  She is instructed to return to the emergency department should she begin to have fevers, worsening abdominal pain or distention.   Answered all questions at bedside.  She verbalizes understanding of discharge instructions. Safe for discharge  Final Clinical Impression(s) / ED Diagnoses Final diagnoses:  Constipation, unspecified constipation type    Rx / DC Orders ED Discharge Orders          Ordered    polyethylene glycol (MIRALAX) 17 g packet  2 times daily        06/23/21 2020    senna-docusate (SENOKOT-S) 8.6-50 MG tablet  Daily        06/23/21 2020    magnesium citrate SOLN   Once        06/23/21 2020             Mickie Hillier, PA-C 06/23/21 2021    Orpah Greek, MD 06/26/21 2330

## 2021-06-23 NOTE — ED Notes (Signed)
Patient discharged to home.  All discharge instructions reviewed.  Patient verbalized understanding via teachback method.  VS WDL.  Respirations even and unlabored.  Wheelchair out of ED.

## 2021-06-23 NOTE — Discharge Instructions (Signed)
You were seen in the emergency department today for constipation.  While you were here we gave you 2 enemas, and disimpacted you.  Your colon appears to be moving slowly after surgery.  I am prescribing you multiple laxatives as well as Zofran for nausea.  Please continue to eat a bland diet and push lots of fluids as tolerated.  Please continue to take your laxatives until you are having soft stools and then you may back off.  Please return to the emergency department for worsening abdominal pain or distention or fevers.

## 2021-06-23 NOTE — ED Notes (Signed)
Soaps suds enema complete. Pt had small BM after 250 mL instilled X 2. Pt had no stool after remaining ~500 mL enema instilled. Pt tolerated well. PA notified. Awaiting new orders.

## 2021-06-29 ENCOUNTER — Telehealth: Payer: Medicare Other

## 2021-06-30 ENCOUNTER — Ambulatory Visit (INDEPENDENT_AMBULATORY_CARE_PROVIDER_SITE_OTHER): Payer: Medicare Other | Admitting: Physician Assistant

## 2021-06-30 ENCOUNTER — Other Ambulatory Visit: Payer: Self-pay

## 2021-06-30 ENCOUNTER — Encounter: Payer: Self-pay | Admitting: Physician Assistant

## 2021-06-30 DIAGNOSIS — Z96641 Presence of right artificial hip joint: Secondary | ICD-10-CM

## 2021-06-30 NOTE — Progress Notes (Signed)
HPI: Leslie Knapp returns today status post right total hip arthroplasty 06/17/2021.  She is overall doing well.  Unfortunately she did develop severe constipation seen in the ER.  She has had to stop all narcotics.  She does state that her bowel movements getting back to normal.  She has no pain except for whenever she is up ambulating in regards to the right hip.  She has had no fevers chills.  Physical exam: Right hip surgical incisions healing well well approximated with interrupted nylon sutures.  There is some areas they are not completely healed therefore stitches were retained.  Right calf supple nontender dorsiflexion plantarflexion right ankle intact.  Ambulates with a rolling walker.  Impression: Status post right total hip arthroplasty   Plan: We will have her wash the incision with antibacterial soap at least every other day and then dry it off completely.  The upper portion of the incision she is to keep clean and dry she may need to place some 4 x 4's in the proximal wound area to keep it dry.  We will see her back next Thursday to remove the sutures.  She will call us sooner or return sooner she has any questions or concerns.  She is on chronic Eliquis.

## 2021-07-01 ENCOUNTER — Telehealth: Payer: Self-pay | Admitting: Physician Assistant

## 2021-07-01 NOTE — Telephone Encounter (Signed)
Called and worked in Thursday @ 2pm

## 2021-07-01 NOTE — Telephone Encounter (Signed)
Pt called needed an post op removal of stitches appt next Thursday. Please open slot and call pt with time. Pt phone number is [phone].

## 2021-07-07 ENCOUNTER — Ambulatory Visit (INDEPENDENT_AMBULATORY_CARE_PROVIDER_SITE_OTHER): Payer: Medicare Other | Admitting: Physician Assistant

## 2021-07-07 ENCOUNTER — Encounter: Payer: Self-pay | Admitting: Physician Assistant

## 2021-07-07 DIAGNOSIS — Z96641 Presence of right artificial hip joint: Secondary | ICD-10-CM

## 2021-07-07 NOTE — Progress Notes (Signed)
HPI: Mrs. Pepperman comes in today for incision check.  Again she is status post right total hip arthroplasty 06/17/2021.  She states overall she is slowly improving.  She is ambulating with a walker.  Physical exam: Surgical incisions well approximated with interrupted nylon sutures no signs of infection or wound dehiscence.  Right calf supple nontender dorsiflexion plantarflexion right ankle intact.  Impression: Status post right total hip arthroplasty 06/17/2021  Plan: She will continue to work on range of motion strengthening right lower extremity.  Sutures removed Steri-Strips applied.  Surgical incision scar tissue massage encouraged.  Follow-up with [place] in 1 month sooner if there is any questions or concerns.

## 2021-07-14 ENCOUNTER — Telehealth: Payer: Self-pay

## 2021-07-14 NOTE — Telephone Encounter (Signed)
Cecilia with [facility] called requesting verbal orders for Pt extension visit twice a week for two week and once a week for three weeks and she would like to start next week

## 2021-07-14 NOTE — Telephone Encounter (Signed)
Verbal order given  

## 2021-07-21 ENCOUNTER — Encounter: Payer: Medicare Other | Admitting: Orthopaedic Surgery

## 2021-07-26 ENCOUNTER — Telehealth: Payer: Self-pay | Admitting: General Practice

## 2021-07-26 NOTE — Telephone Encounter (Signed)
Left message for patient to contact our office to schedule annual exam in March with Dr. Ihor Dow.

## 2021-07-26 NOTE — Telephone Encounter (Signed)
-----   Message from Maurine Minister, [place] sent at 03/21/2021 10:06 AM EDT ----- Regarding: Annual Return in about 6 months (around 09/18/2021) for in person.  Annual with Dr. Eugenie Norrie.

## 2021-08-04 ENCOUNTER — Ambulatory Visit (INDEPENDENT_AMBULATORY_CARE_PROVIDER_SITE_OTHER): Payer: Medicare Other | Admitting: Physician Assistant

## 2021-08-04 ENCOUNTER — Other Ambulatory Visit: Payer: Self-pay

## 2021-08-04 ENCOUNTER — Encounter: Payer: Self-pay | Admitting: Physician Assistant

## 2021-08-04 DIAGNOSIS — Z96641 Presence of right artificial hip joint: Secondary | ICD-10-CM

## 2021-08-04 NOTE — Progress Notes (Addendum)
HPI: Leslie Knapp returns today 6-week status post right total hip arthroplasty.  Patient overall is doing well.  She has some groin pain.  She is having lateral hip pain and muscle pain in her left thigh.  She has been doing abduction exercises with physical therapy.  She also notes weakness due to deconditioning upper quarter and has gait balance issues.  She is taking ibuprofen only.  Physical exam: Right hip surgical incisions healing well no signs of infection.  Right hip fluid motion.  Tenderness over the right hip trochanteric region.  Right calf supple nontender dorsiflexion plantarflexion right ankle intact.  Impression: Status post right total hip arthroplasty 06/17/2021  Plan: She will stop doing abduction exercises.  I explained to her that she needs to just primarily walk for exercise.  If she needs to go to outpatient therapy for gait and balance we can definitely arrange this.  See her back in approximately 5 weeks see how she is doing overall if she continues to have lateral hip pain over the trochanteric region that is causing her significant discomfort would recommend cortisone injection at that time.  Questions were encouraged and answered at length.

## 2021-09-08 ENCOUNTER — Ambulatory Visit (INDEPENDENT_AMBULATORY_CARE_PROVIDER_SITE_OTHER): Payer: Medicare Other | Admitting: Physician Assistant

## 2021-09-08 ENCOUNTER — Encounter: Payer: Self-pay | Admitting: Physician Assistant

## 2021-09-08 VITALS — Ht 69.0 in | Wt 298.8 lb

## 2021-09-08 DIAGNOSIS — Z96641 Presence of right artificial hip joint: Secondary | ICD-10-CM

## 2021-09-08 DIAGNOSIS — M1612 Unilateral primary osteoarthritis, left hip: Secondary | ICD-10-CM

## 2021-09-08 NOTE — Progress Notes (Signed)
HPI: Mrs. Bartek returns today 2 months 3 weeks status post right total hip arthroplasty.  States right hip is overall doing well.  She is walking some without a walker.  She states she is not having much pain at all.  However she is having more pain in her left hip which she has known osteoarthritis which she is moderate to severe on radiograph.  She is no longer taking any pain medication.  She denies any chest pain, shortness of breath, fevers, or chills.  Patient is asking about scheduling left total hip sometime in May. ? ?Review of systems see HPI otherwise negative or noncontributory. ? ?Physical exam:Weight 298.8 Height 5 ft 9 in ?General: Well-developed well-nourished female no acute distress mood and affect appropriate. ? ?Bilateral hips: Good range of motion right hip without pain.  Surgical incisions well-healed no signs of infection or wound breakdown.  Left hip limited external rotation and virtually no internal rotation.  Calves are supple and nontender bilaterally dorsiflexion plantarflexion intact bilaterally. ? ?Impression: Status post right total hip arthroplasty 06/17/2021 ?Left hip osteoarthritis ? ?Plan: We will work on scheduling her for left total hip arthroplasty sometime in May.  She will follow-up with [place] postoperatively in the left total hip arthroplasty.  Questions were encouraged and answered by Dr. Ninfa Linden myself.  Risk benefits of surgery discussed with the patient.  She understands risk benefits as she is recently under gone a right total hip arthroplasty.  She is on Eliquis and will need to stop her Eliquis for 4 days prior to surgery. ? ? ? ? ? ? ? ?

## 2021-09-20 ENCOUNTER — Inpatient Hospital Stay: Payer: Medicare Other | Attending: Hematology & Oncology

## 2021-09-20 ENCOUNTER — Encounter: Payer: Self-pay | Admitting: Hematology & Oncology

## 2021-09-20 ENCOUNTER — Inpatient Hospital Stay (HOSPITAL_BASED_OUTPATIENT_CLINIC_OR_DEPARTMENT_OTHER): Payer: Medicare Other | Admitting: Hematology & Oncology

## 2021-09-20 ENCOUNTER — Other Ambulatory Visit: Payer: Self-pay

## 2021-09-20 VITALS — BP 128/81 | HR 85 | Temp 97.5°F | Resp 16 | Wt 292.0 lb

## 2021-09-20 DIAGNOSIS — D6859 Other primary thrombophilia: Secondary | ICD-10-CM | POA: Diagnosis not present

## 2021-09-20 DIAGNOSIS — Z7901 Long term (current) use of anticoagulants: Secondary | ICD-10-CM | POA: Diagnosis not present

## 2021-09-20 DIAGNOSIS — K5900 Constipation, unspecified: Secondary | ICD-10-CM | POA: Insufficient documentation

## 2021-09-20 DIAGNOSIS — I2692 Saddle embolus of pulmonary artery without acute cor pulmonale: Secondary | ICD-10-CM | POA: Insufficient documentation

## 2021-09-20 DIAGNOSIS — I70208 Unspecified atherosclerosis of native arteries of extremities, other extremity: Secondary | ICD-10-CM | POA: Diagnosis not present

## 2021-09-20 DIAGNOSIS — M2550 Pain in unspecified joint: Secondary | ICD-10-CM | POA: Diagnosis not present

## 2021-09-20 DIAGNOSIS — Z886 Allergy status to analgesic agent status: Secondary | ICD-10-CM | POA: Insufficient documentation

## 2021-09-20 DIAGNOSIS — I742 Embolism and thrombosis of arteries of the upper extremities: Secondary | ICD-10-CM | POA: Diagnosis present

## 2021-09-20 DIAGNOSIS — Z79899 Other long term (current) drug therapy: Secondary | ICD-10-CM | POA: Diagnosis not present

## 2021-09-20 LAB — CMP (CANCER CENTER ONLY)
ALT: 8 U/L (ref 0–44)
AST: 15 U/L (ref 15–41)
Albumin: 4.1 g/dL (ref 3.5–5.0)
Alkaline Phosphatase: 108 U/L (ref 38–126)
Anion gap: 6 (ref 5–15)
BUN: 17 mg/dL (ref 8–23)
CO2: 29 mmol/L (ref 22–32)
Calcium: 10 mg/dL (ref 8.9–10.3)
Chloride: 105 mmol/L (ref 98–111)
Creatinine: 0.71 mg/dL (ref 0.44–1.00)
GFR, Estimated: >60 mL/min (ref >60)
Glucose, Bld: 96 mg/dL (ref 70–99)
Potassium: 4.4 mmol/L (ref 3.5–5.1)
Sodium: 140 mmol/L (ref 135–145)
Total Bilirubin: 0.6 mg/dL (ref 0.3–1.2)
Total Protein: 6.6 g/dL (ref 6.5–8.1)

## 2021-09-20 LAB — CBC WITH DIFFERENTIAL (CANCER CENTER ONLY)
Abs Immature Granulocytes: 0.02 10*3/uL (ref 0.00–0.07)
Basophils Absolute: 0 10*3/uL (ref 0.0–0.1)
Basophils Relative: 1 %
Eosinophils Absolute: 0.2 10*3/uL (ref 0.0–0.5)
Eosinophils Relative: 3 %
HCT: 47.5 % — ABNORMAL HIGH (ref 36.0–46.0)
Hemoglobin: 15.6 g/dL — ABNORMAL HIGH (ref 12.0–15.0)
Immature Granulocytes: 0 %
Lymphocytes Relative: 38 %
Lymphs Abs: 2.1 10*3/uL (ref 0.7–4.0)
MCH: 29.2 pg (ref 26.0–34.0)
MCHC: 32.8 g/dL (ref 30.0–36.0)
MCV: 89 fL (ref 80.0–100.0)
Monocytes Absolute: 0.5 10*3/uL (ref 0.1–1.0)
Monocytes Relative: 9 %
Neutro Abs: 2.8 10*3/uL (ref 1.7–7.7)
Neutrophils Relative %: 49 %
Platelet Count: 236 10*3/uL (ref 150–400)
RBC: 5.34 MIL/uL — ABNORMAL HIGH (ref 3.87–5.11)
RDW: 12.7 % (ref 11.5–15.5)
WBC Count: 5.7 10*3/uL (ref 4.0–10.5)
nRBC: 0 % (ref 0.0–0.2)

## 2021-09-20 NOTE — Progress Notes (Signed)
?Hematology and Oncology Follow Up Visit ? ?Leslie Knapp ?[id number] ?09/09/1950 71 y.o. ?09/20/2021 ? ? 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  ?Current Therapy:        ?Eliquis 2.5 mg PO BID --changed on 03/04/2021 ?  ?Interim History:  Leslie Knapp is here today for follow-up.  She had her right hip operated on back in December.  She did very well.  She had problems with constipation afterwards. ? ?The next surgery is going to be in May.  Will be on May 19.  This will be her left hip. ? ?She is on Eliquis 2.5 mg p.o. twice daily.  She had no problems with bleeding during the procedure.  She had a spinal done.  She had no issues with the spinal. ? ?I told her that she can stop the Eliquis 3 days prior to surgery if she does not have a spinal.  Again, she is only on 2.5 mg twice a day of the Eliquis. ? ?She has had no problems with headache.  There is been no cough or shortness of breath.  She has had no chest wall pain..  She is very happy that she is back in the pool.  She like does her water aerobics 3 times a week.  She really enjoys this. ? ?Overall, I would say performance status is ECOG 1.   ? ? ?Medications:  ?Allergies as of 09/20/2021   ? ?   Reactions  ? Celebrex [celecoxib] Swelling, Rash  ? Lactose Intolerance (gi) Other (See Comments)  ? ?  ? ?  ?Medication List  ?  ? ?  ? Accurate as of September 20, 2021  8:10 AM. If you have any questions, ask your nurse or doctor.  ?  ?  ? ?  ? ?STOP taking these medications   ? ?methocarbamol 500 MG tablet ?Commonly known as: ROBAXIN ?Stopped by: Volanda Napoleon, MD ?  ?ondansetron 4 MG tablet ?Commonly known as: ZOFRAN ?Stopped by: Volanda Napoleon, MD ?  ? ?  ? ?TAKE these medications   ? ?apixaban 2.5 MG Tabs tablet ?Commonly known as: ELIQUIS ?Take 1 tablet (2.5 mg total) by mouth 2 (two) times daily. ?  ?Cholecalciferol 25  MCG/0.03ML Liqd ?Take 1,000 Units by mouth once a week. ?  ?Enzyme Digest Caps ?Take 1 capsule by mouth daily as needed (heartburn). ?  ?MINERALS PO ?Take 1 capsule by mouth daily. ?  ?polyethylene glycol 17 g packet ?Commonly known as: MiraLax ?Take 17 g by mouth 2 (two) times daily. ?  ?senna-docusate 8.6-50 MG tablet ?Commonly known as: Senokot-S ?Take 1 tablet by mouth daily. ?  ? ?  ? ? ?Allergies:  ?Allergies  ?Allergen Reactions  ? Celebrex [Celecoxib] Swelling and Rash  ? Lactose Intolerance (Gi) Other (See Comments)  ? ? ?Past Medical History, Surgical history, Social history, and Family History were reviewed and updated. ? ?Review of Systems: ?Review of Systems  ?Constitutional: Negative.   ?HENT: Negative.    ?Eyes: Negative.   ?Respiratory: Negative.    ?Cardiovascular: Negative.   ?Gastrointestinal: Negative.   ?Genitourinary: Negative.   ?Musculoskeletal:  Positive for joint pain.  ?Skin: Negative.   ?Neurological: Negative.   ?Endo/Heme/Allergies: Negative.   ?Psychiatric/Behavioral: Negative.    ? ? ?Physical Exam: ? weight is 292 lb (132.5 kg). Her oral temperature is 97.5 ?F (36.4 ?C) (abnormal). Her blood  pressure is 128/81 and her pulse is 85. Her respiration is 16 and oxygen saturation is 99%.  ? ?Wt Readings from Last 3 Encounters:  ?09/20/21 292 lb (132.5 kg)  ?09/08/21 298 lb 12.8 oz (135.5 kg)  ?06/23/21 294 lb (133.4 kg)  ? ? ?Physical Exam ?Vitals reviewed.  ?HENT:  ?   Head: Normocephalic and atraumatic.  ?Eyes:  ?   Pupils: Pupils are equal, round, and reactive to light.  ?Cardiovascular:  ?   Rate and Rhythm: Normal rate and regular rhythm.  ?   Heart sounds: Normal heart sounds.  ?Pulmonary:  ?   Effort: Pulmonary effort is normal.  ?   Breath sounds: Normal breath sounds.  ?Abdominal:  ?   General: Bowel sounds are normal.  ?   Palpations: Abdomen is soft.  ?Musculoskeletal:     ?   General: No tenderness or deformity. Normal range of motion.  ?   Cervical back: Normal range of  motion.  ?Lymphadenopathy:  ?   Cervical: No cervical adenopathy.  ?Skin: ?   General: Skin is warm and dry.  ?   Findings: No erythema or rash.  ?Neurological:  ?   Mental Status: She is alert and oriented to person, place, and time.  ?Psychiatric:     ?   Behavior: Behavior normal.     ?   Thought Content: Thought content normal.     ?   Judgment: Judgment normal.  ? ? ? ?Lab Results  ?Component Value Date  ? WBC 5.7 09/20/2021  ? HGB 15.6 (H) 09/20/2021  ? HCT 47.5 (H) 09/20/2021  ? MCV 89.0 09/20/2021  ? PLT 236 09/20/2021  ? ?Lab Results  ?Component Value Date  ? FERRITIN 129 08/23/2020  ? IRON 63 08/23/2020  ? TIBC 383 08/23/2020  ? UIBC 319 08/23/2020  ? IRONPCTSAT 16 (L) 08/23/2020  ? ?Lab Results  ?Component Value Date  ? RBC 5.34 (H) 09/20/2021  ? ?No results found for: KPAFRELGTCHN, LAMBDASER, KAPLAMBRATIO ?No results found for: IGGSERUM, IGA, IGMSERUM ?No results found for: TOTALPROTELP, ALBUMINELP, A1GS, A2GS, BETS, BETA2SER, GAMS, MSPIKE, SPEI ?  Chemistry   ?   ?Component Value Date/Time  ? NA 136 06/23/2021 1539  ? NA 140 09/24/2019 1934  ? K 3.9 06/23/2021 1539  ? CL 100 06/23/2021 1539  ? CO2 27 06/23/2021 1539  ? BUN 12 06/23/2021 1539  ? BUN 15 09/24/2019 1934  ? CREATININE 0.57 06/23/2021 1539  ? CREATININE 0.64 06/14/2021 0818  ? GLU 87 05/21/2018 0000  ?    ?Component Value Date/Time  ? CALCIUM 8.8 (L) 06/23/2021 1539  ? ALKPHOS 91 06/23/2021 1539  ? AST 25 06/23/2021 1539  ? AST 15 06/14/2021 0818  ? ALT 22 06/23/2021 1539  ? ALT 10 06/14/2021 0818  ? BILITOT 0.8 06/23/2021 1539  ? BILITOT 0.8 06/14/2021 0818  ?  ? ? ? ?Impression and Plan: Leslie Knapp is a very pleasant 71 yo caucasian female with history of bilateral saddle pulmonary emboli with right heart strain as well as a right brachial artery thrombus. ? ?Her initial lab work up did show both a protein C and a protein S deficiency at the time.  However, we have repeated these and everything is normal now. ? ?We got her through their  first hip surgery.  We now get ready for the second hip surgery.  This will be in May. ? ?I would like to see her back the week she has her  surgery just make sure everything is doing okay for her. ? ?I am just happy that she is back in the swimming pool.  She really enjoys this.  Hopefully, this will help her lose little weight. ? ? ? ?Volanda Napoleon, MD ?3/14/20238:10 AM ?

## 2021-09-22 ENCOUNTER — Other Ambulatory Visit: Payer: Self-pay

## 2021-09-26 ENCOUNTER — Encounter: Payer: Self-pay | Admitting: Orthopaedic Surgery

## 2021-10-21 ENCOUNTER — Other Ambulatory Visit (HOSPITAL_BASED_OUTPATIENT_CLINIC_OR_DEPARTMENT_OTHER): Payer: Self-pay | Admitting: Family Medicine

## 2021-10-21 DIAGNOSIS — Z1231 Encounter for screening mammogram for malignant neoplasm of breast: Secondary | ICD-10-CM

## 2021-10-24 ENCOUNTER — Other Ambulatory Visit (HOSPITAL_BASED_OUTPATIENT_CLINIC_OR_DEPARTMENT_OTHER): Payer: Self-pay | Admitting: Family Medicine

## 2021-10-24 DIAGNOSIS — E041 Nontoxic single thyroid nodule: Secondary | ICD-10-CM

## 2021-10-26 ENCOUNTER — Ambulatory Visit (HOSPITAL_BASED_OUTPATIENT_CLINIC_OR_DEPARTMENT_OTHER)
Admission: RE | Admit: 2021-10-26 | Discharge: 2021-10-26 | Disposition: A | Discharge status: Home / Self Care | Payer: Medicare Other | Source: Ambulatory Visit | Attending: Family Medicine | Admitting: Family Medicine

## 2021-10-26 DIAGNOSIS — E041 Nontoxic single thyroid nodule: Secondary | ICD-10-CM

## 2021-11-04 ENCOUNTER — Other Ambulatory Visit: Payer: Self-pay | Admitting: Physician Assistant

## 2021-11-04 DIAGNOSIS — M1612 Unilateral primary osteoarthritis, left hip: Secondary | ICD-10-CM

## 2021-11-15 NOTE — Progress Notes (Addendum)
Anesthesia Review: ? ?PCP: DR  Legrand Como Hilts  ?Cardiologist : dr Lorie Phenix LOV 06/15/21  ?Chest x-ray : ?EKG : 06/15/21  ?Ct Card Scoring- 06/15/21  ?Echo : 06/15/21  ?Stress test: ?Cardiac Cath :  ?Activity level:  ?Sleep Study/ CPAP : ?Fasting Blood Sugar :      / Checks Blood Sugar -- times a day:   ?Blood Thinner/ Instructions /Last Dose: ?ASA / Instructions/ Last Dose :   ?Eliquis - stop 3 days prior  ?

## 2021-11-16 NOTE — Progress Notes (Signed)
DUE TO COVID-19 ONLY  2  VISITOR IS ALLOWED TO COME WITH YOU AND STAY IN THE WAITING ROOM ONLY DURING PRE OP AND PROCEDURE DAY OF SURGERY.   4  VISITOR  MAY VISIT WITH YOU AFTER SURGERY IN YOUR PRIVATE ROOM DURING VISITING HOURS ONLY! ?YOU MAY HAVE ONE PERSON SPEND THE NITE WITH YOU IN YOUR ROOM AFTER SURGERY.   ? ? Your procedure is scheduled on:  ? 11/25/2021  ? Report to [facility] Main  Entrance ? ? Report to admitting at    0630            AM ?DO NOT BRING INSURANCE CARD, PICTURE ID OR WALLET DAY OF SURGERY.  ?  ? ? Call this number if you have problems the morning of surgery [phone]  ? ? REMEMBER: NO  SOLID FOODS , CANDY, GUM OR MINTS AFTER MIDNITE THE NITE BEFORE SURGERY .       [facility] Kitchen CLEAR LIQUIDS UNTIL      0615am           DAY OF SURGERY.      PLEASE FINISH ENSURE DRINK PER SURGEON ORDER  WHICH NEEDS TO BE COMPLETED AT   0615am       MORNING OF SURGERY.   ? ? ? ? ?CLEAR LIQUID DIET ? ? ?Foods Allowed      ?WATER ?BLACK COFFEE ( SUGAR OK, NO MILK, CREAM OR CREAMER) REGULAR AND DECAF  ?TEA ( SUGAR OK NO MILK, CREAM, OR CREAMER) REGULAR AND DECAF  ?PLAIN JELLO ( NO RED)  ?FRUIT ICES ( NO RED, NO FRUIT PULP)  ?POPSICLES ( NO RED)  ?JUICE- APPLE, WHITE GRAPE AND WHITE CRANBERRY  ?SPORT DRINK LIKE GATORADE ( NO RED)  ?CLEAR BROTH ( VEGETABLE , CHICKEN OR BEEF)                                                               ? ?    ? ?BRUSH YOUR TEETH MORNING OF SURGERY AND RINSE YOUR MOUTH OUT, NO CHEWING GUM CANDY OR MINTS. ?  ? ? Take these medicines the morning of surgery with A SIP OF WATER:  none  ? ? ?DO NOT TAKE ANY DIABETIC MEDICATIONS DAY OF YOUR SURGERY ?                  ?            You may not have any metal on your body including hair pins and  ?            piercings  Do not wear jewelry, make-up, lotions, powders or perfumes, deodorant ?            Do not wear nail polish on your fingernails.   ?           IF YOU ARE A FEMALE AND WANT TO SHAVE UNDER ARMS OR LEGS PRIOR TO SURGERY YOU MUST DO  SO AT LEAST 48 HOURS PRIOR TO SURGERY.  ?            Men may shave face and neck. ? ? Do not bring valuables to the hospital. [facility] NOT ?            RESPONSIBLE   FOR  VALUABLES. ? Contacts, dentures or bridgework may not be worn into surgery. ? Leave suitcase in the car. After surgery it may be brought to your room. ? ?  ? Patients discharged the day of surgery will not be allowed to drive home. IF YOU ARE HAVING SURGERY AND GOING HOME THE SAME DAY, YOU MUST HAVE AN ADULT TO DRIVE YOU HOME AND BE WITH YOU FOR 24 HOURS. YOU MAY GO HOME BY TAXI OR UBER OR ORTHERWISE, BUT AN ADULT MUST ACCOMPANY YOU HOME AND STAY WITH YOU FOR 24 HOURS. ?  ? ?            Please read over the following fact sheets you were given: ?_____________________________________________________________________ ? ?[facility] - Preparing for Surgery ?Before surgery, you can play an important role.  Because skin is not sterile, your skin needs to be as free of germs as possible.  You can reduce the number of germs on your skin by washing with CHG (chlorahexidine gluconate) soap before surgery.  CHG is an antiseptic cleaner which kills germs and bonds with the skin to continue killing germs even after washing. ?Please DO NOT use if you have an allergy to CHG or antibacterial soaps.  If your skin becomes reddened/irritated stop using the CHG and inform your nurse when you arrive at Short Stay. ?Do not shave (including legs and underarms) for at least 48 hours prior to the first CHG shower.  You may shave your face/neck. ?Please follow these instructions carefully: ? 1.  Shower with CHG Soap the night before surgery and the  morning of Surgery. ? 2.  If you choose to wash your hair, wash your hair first as usual with your  normal  shampoo. ? 3.  After you shampoo, rinse your hair and body thoroughly to remove the  shampoo.                           4.  Use CHG as you would any other liquid soap.  You can apply chg directly  to the skin and wash   ?                     Gently with a scrungie or clean washcloth. ? 5.  Apply the CHG Soap to your body ONLY FROM THE NECK DOWN.   Do not use on face/ open      ?                     Wound or open sores. Avoid contact with eyes, ears mouth and genitals (private parts).  ?                     Production manager,  Genitals (private parts) with your normal soap. ?            6.  Wash thoroughly, paying special attention to the area where your surgery  will be performed. ? 7.  Thoroughly rinse your body with warm water from the neck down. ? 8.  DO NOT shower/wash with your normal soap after using and rinsing off  the CHG Soap. ?               9.  Pat yourself dry with a clean towel. ?           10.  Wear clean pajamas. ?  11.  Place clean sheets on your bed the night of your first shower and do not  sleep with pets. ?Day of Surgery : ?Do not apply any lotions/deodorants the morning of surgery.  Please wear clean clothes to the hospital/surgery center. ? ?FAILURE TO FOLLOW THESE INSTRUCTIONS MAY RESULT IN THE CANCELLATION OF YOUR SURGERY ?PATIENT SIGNATURE_________________________________ ? ?NURSE SIGNATURE__________________________________ ? ?________________________________________________________________________  ? ? ?           ?

## 2021-11-17 ENCOUNTER — Other Ambulatory Visit: Payer: Self-pay

## 2021-11-17 ENCOUNTER — Encounter (HOSPITAL_COMMUNITY): Payer: Self-pay

## 2021-11-17 ENCOUNTER — Encounter (HOSPITAL_COMMUNITY)
Admission: RE | Admit: 2021-11-17 | Discharge: 2021-11-17 | Disposition: A | Discharge status: Home / Self Care | Payer: Medicare Other | Source: Ambulatory Visit | Attending: Orthopaedic Surgery | Admitting: Orthopaedic Surgery

## 2021-11-17 VITALS — BP 158/82 | HR 79 | Temp 98.3°F | Resp 16 | Ht 69.0 in | Wt 292.0 lb

## 2021-11-17 DIAGNOSIS — Z01818 Encounter for other preprocedural examination: Secondary | ICD-10-CM | POA: Insufficient documentation

## 2021-11-17 DIAGNOSIS — M1612 Unilateral primary osteoarthritis, left hip: Secondary | ICD-10-CM | POA: Insufficient documentation

## 2021-11-17 HISTORY — DX: Fibromyalgia: M79.7

## 2021-11-17 LAB — CBC
HCT: 47.7 % — ABNORMAL HIGH (ref 36.0–46.0)
Hemoglobin: 16 g/dL — ABNORMAL HIGH (ref 12.0–15.0)
MCH: 30 pg (ref 26.0–34.0)
MCHC: 33.5 g/dL (ref 30.0–36.0)
MCV: 89.3 fL (ref 80.0–100.0)
Platelets: 208 10*3/uL (ref 150–400)
RBC: 5.34 MIL/uL — ABNORMAL HIGH (ref 3.87–5.11)
RDW: 13.1 % (ref 11.5–15.5)
WBC: 7.4 10*3/uL (ref 4.0–10.5)
nRBC: 0 % (ref 0.0–0.2)

## 2021-11-17 LAB — BASIC METABOLIC PANEL
Anion gap: 9 (ref 5–15)
BUN: 18 mg/dL (ref 8–23)
CO2: 24 mmol/L (ref 22–32)
Calcium: 9.4 mg/dL (ref 8.9–10.3)
Chloride: 105 mmol/L (ref 98–111)
Creatinine, Ser: 0.72 mg/dL (ref 0.44–1.00)
GFR, Estimated: >60 mL/min (ref >60)
Glucose, Bld: 91 mg/dL (ref 70–99)
Potassium: 4.3 mmol/L (ref 3.5–5.1)
Sodium: 138 mmol/L (ref 135–145)

## 2021-11-17 LAB — SURGICAL PCR SCREEN
MRSA, PCR: NEGATIVE
Staphylococcus aureus: NEGATIVE

## 2021-11-22 ENCOUNTER — Inpatient Hospital Stay (HOSPITAL_BASED_OUTPATIENT_CLINIC_OR_DEPARTMENT_OTHER): Payer: Medicare Other | Admitting: Family

## 2021-11-22 ENCOUNTER — Encounter: Payer: Self-pay | Admitting: Family

## 2021-11-22 ENCOUNTER — Inpatient Hospital Stay: Payer: Medicare Other | Attending: Hematology & Oncology

## 2021-11-22 ENCOUNTER — Ambulatory Visit: Payer: Medicare Other | Admitting: Hematology & Oncology

## 2021-11-22 VITALS — BP 132/87 | HR 82 | Temp 98.1°F | Resp 18 | Wt 293.8 lb

## 2021-11-22 DIAGNOSIS — Z79899 Other long term (current) drug therapy: Secondary | ICD-10-CM | POA: Diagnosis not present

## 2021-11-22 DIAGNOSIS — M7989 Other specified soft tissue disorders: Secondary | ICD-10-CM | POA: Insufficient documentation

## 2021-11-22 DIAGNOSIS — Z886 Allergy status to analgesic agent status: Secondary | ICD-10-CM | POA: Insufficient documentation

## 2021-11-22 DIAGNOSIS — Z7901 Long term (current) use of anticoagulants: Secondary | ICD-10-CM | POA: Insufficient documentation

## 2021-11-22 DIAGNOSIS — I742 Embolism and thrombosis of arteries of the upper extremities: Secondary | ICD-10-CM | POA: Insufficient documentation

## 2021-11-22 DIAGNOSIS — R202 Paresthesia of skin: Secondary | ICD-10-CM | POA: Diagnosis not present

## 2021-11-22 DIAGNOSIS — D6859 Other primary thrombophilia: Secondary | ICD-10-CM | POA: Diagnosis not present

## 2021-11-22 DIAGNOSIS — I70208 Unspecified atherosclerosis of native arteries of extremities, other extremity: Secondary | ICD-10-CM | POA: Diagnosis not present

## 2021-11-22 DIAGNOSIS — R200 Anesthesia of skin: Secondary | ICD-10-CM | POA: Insufficient documentation

## 2021-11-22 LAB — CBC WITH DIFFERENTIAL (CANCER CENTER ONLY)
Abs Immature Granulocytes: 0.01 10*3/uL (ref 0.00–0.07)
Basophils Absolute: 0 10*3/uL (ref 0.0–0.1)
Basophils Relative: 1 %
Eosinophils Absolute: 0.2 10*3/uL (ref 0.0–0.5)
Eosinophils Relative: 3 %
HCT: 49 % — ABNORMAL HIGH (ref 36.0–46.0)
Hemoglobin: 16.1 g/dL — ABNORMAL HIGH (ref 12.0–15.0)
Immature Granulocytes: 0 %
Lymphocytes Relative: 34 %
Lymphs Abs: 1.8 10*3/uL (ref 0.7–4.0)
MCH: 29.4 pg (ref 26.0–34.0)
MCHC: 32.9 g/dL (ref 30.0–36.0)
MCV: 89.6 fL (ref 80.0–100.0)
Monocytes Absolute: 0.4 10*3/uL (ref 0.1–1.0)
Monocytes Relative: 8 %
Neutro Abs: 2.8 10*3/uL (ref 1.7–7.7)
Neutrophils Relative %: 54 %
Platelet Count: 207 10*3/uL (ref 150–400)
RBC: 5.47 MIL/uL — ABNORMAL HIGH (ref 3.87–5.11)
RDW: 13.1 % (ref 11.5–15.5)
WBC Count: 5.1 10*3/uL (ref 4.0–10.5)
nRBC: 0 % (ref 0.0–0.2)

## 2021-11-22 LAB — CMP (CANCER CENTER ONLY)
ALT: 10 U/L (ref 0–44)
AST: 17 U/L (ref 15–41)
Albumin: 4.3 g/dL (ref 3.5–5.0)
Alkaline Phosphatase: 122 U/L (ref 38–126)
Anion gap: 5 (ref 5–15)
BUN: 13 mg/dL (ref 8–23)
CO2: 32 mmol/L (ref 22–32)
Calcium: 10 mg/dL (ref 8.9–10.3)
Chloride: 106 mmol/L (ref 98–111)
Creatinine: 0.67 mg/dL (ref 0.44–1.00)
GFR, Estimated: >60 mL/min (ref >60)
Glucose, Bld: 103 mg/dL — ABNORMAL HIGH (ref 70–99)
Potassium: 5.2 mmol/L — ABNORMAL HIGH (ref 3.5–5.1)
Sodium: 143 mmol/L (ref 135–145)
Total Bilirubin: 0.7 mg/dL (ref 0.3–1.2)
Total Protein: 7.3 g/dL (ref 6.5–8.1)

## 2021-11-22 NOTE — Progress Notes (Signed)
?Hematology and Oncology Follow Up Visit ? ?Leslie Knapp ?[id number] ?Dec 01, 1950 71 y.o. ?11/22/2021 ? ? 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  ?Current Therapy:        ?Eliquis 2.5 mg PO BID --changed on 03/04/2021 ?  ?Interim History:  Leslie Knapp is here today for follow-up. She is scheduled to have a left total hip arthroplasty on 5/19.  ?We will have her hold her the Eliquis starting 2 days prior to surgery (tomorrow) and restart the day after (Saturday). She is in agreement with the plan.  ?No episodes of bleeding. No bruising or petechiae.  ?No fever, chills, n/v, cough, rash, dizziness, SOB, chest pain, palpitations, abdominal pain or changes in bowel or bladder habits.  ?She has minimal swelling in her lower extremities. She wears compression stockings daily. Pedal pulses are 2+.  ?She has intermittent tingling and numbness in her toes.  ?No falls or syncope to report.  ?Appetite and hydration are good. Weight stable at 293 lbs.  ? ?ECOG Performance Status: 1 - Symptomatic but completely ambulatory ? ?Medications:  ?Allergies as of 11/22/2021   ? ?   Reactions  ? Celebrex [celecoxib] Swelling, Rash  ? Lactose Intolerance (gi) Other (See Comments)  ? ?  ? ?  ?Medication List  ?  ? ?  ? Accurate as of Nov 22, 2021  8:33 AM. If you have any questions, ask your nurse or doctor.  ?  ?  ? ?  ? ?apixaban 2.5 MG Tabs tablet ?Commonly known as: ELIQUIS ?Take 1 tablet (2.5 mg total) by mouth 2 (two) times daily. ?  ?Cholecalciferol 25 MCG/0.03ML Liqd ?Take 1,000 Units by mouth daily. ?  ?Enzyme Digest Caps ?Take 1 capsule by mouth daily as needed (heartburn). ?  ?ibuprofen 200 MG tablet ?Commonly known as: ADVIL ?Take 400 mg by mouth at bedtime as needed. ?  ?MINERALS PO ?Take 1 capsule by mouth daily. ?  ? ?  ? ? ?Allergies:  ?Allergies  ?Allergen Reactions  ? Celebrex [Celecoxib]  Swelling and Rash  ? Lactose Intolerance (Gi) Other (See Comments)  ? ? ?Past Medical History, Surgical history, Social history, and Family History were reviewed and updated. ? ?Review of Systems: ?All other 10 point review of systems is negative.  ? ?Physical Exam: ? weight is 293 lb 12.8 oz (133.3 kg). Her oral temperature is 98.1 ?F (36.7 ?C). Her blood pressure is 132/87 and her pulse is 82. Her respiration is 18 and oxygen saturation is 99%.  ? ?Wt Readings from Last 3 Encounters:  ?11/22/21 293 lb 12.8 oz (133.3 kg)  ?11/17/21 292 lb (132.5 kg)  ?09/20/21 292 lb (132.5 kg)  ? ? ?Ocular: Sclerae unicteric, pupils equal, round and reactive to light ?Ear-nose-throat: Oropharynx clear, dentition fair ?Lymphatic: No cervical or supraclavicular adenopathy ?Lungs no rales or rhonchi, good excursion bilaterally ?Heart regular rate and rhythm, no murmur appreciated ?Abd soft, nontender, positive bowel sounds ?MSK no focal spinal tenderness, no joint edema ?Neuro: non-focal, well-oriented, appropriate affect ?Breasts: Deferred  ? ?Lab Results  ?Component Value Date  ? WBC 5.1 11/22/2021  ? HGB 16.1 (H) 11/22/2021  ? HCT 49.0 (H) 11/22/2021  ? MCV 89.6 11/22/2021  ? PLT 207 11/22/2021  ? ?Lab Results  ?Component Value Date  ? FERRITIN 129 08/23/2020  ? IRON 63 08/23/2020  ? TIBC 383 08/23/2020  ? UIBC 319 08/23/2020  ?  IRONPCTSAT 16 (L) 08/23/2020  ? ?Lab Results  ?Component Value Date  ? RBC 5.47 (H) 11/22/2021  ? ?No results found for: KPAFRELGTCHN, LAMBDASER, KAPLAMBRATIO ?No results found for: IGGSERUM, IGA, IGMSERUM ?No results found for: TOTALPROTELP, ALBUMINELP, A1GS, A2GS, BETS, BETA2SER, GAMS, MSPIKE, SPEI ?  Chemistry   ?   ?Component Value Date/Time  ? NA 143 11/22/2021 0754  ? NA 140 09/24/2019 1934  ? K 5.2 (H) 11/22/2021 0754  ? CL 106 11/22/2021 0754  ? CO2 32 11/22/2021 0754  ? BUN 13 11/22/2021 0754  ? BUN 15 09/24/2019 1934  ? CREATININE 0.67 11/22/2021 0754  ? GLU 87 05/21/2018 0000  ?    ?Component  Value Date/Time  ? CALCIUM 10.0 11/22/2021 0754  ? ALKPHOS 122 11/22/2021 0754  ? AST 17 11/22/2021 0754  ? ALT 10 11/22/2021 0754  ? BILITOT 0.7 11/22/2021 0754  ?  ? ? ? ?Impression and Plan: Leslie Knapp is a very pleasant 71 yo caucasian female with history of bilateral saddle pulmonary emboli with right heart strain as well as a right brachial artery thrombus. She was noted to have a protein C and protein S deficiency.  ?She will be having a left total hip arthroplasty on 5/19.  ?She will hold her Eliquis 2 days prior to surgery and restart the day after. She verbalized understanding.  ?Follow-up in 4 months.  ? ?Lottie Dawson, NP ?5/16/20238:33 AM ? ?

## 2021-11-23 ENCOUNTER — Ambulatory Visit (HOSPITAL_BASED_OUTPATIENT_CLINIC_OR_DEPARTMENT_OTHER)
Admission: RE | Admit: 2021-11-23 | Discharge: 2021-11-23 | Disposition: A | Discharge status: Home / Self Care | Payer: Medicare Other | Source: Ambulatory Visit | Attending: Family Medicine | Admitting: Family Medicine

## 2021-11-23 ENCOUNTER — Encounter (HOSPITAL_BASED_OUTPATIENT_CLINIC_OR_DEPARTMENT_OTHER): Payer: Self-pay

## 2021-11-23 DIAGNOSIS — Z1231 Encounter for screening mammogram for malignant neoplasm of breast: Secondary | ICD-10-CM | POA: Diagnosis present

## 2021-11-24 DIAGNOSIS — M1612 Unilateral primary osteoarthritis, left hip: Secondary | ICD-10-CM

## 2021-11-24 NOTE — H&P (Signed)
TOTAL HIP ADMISSION H&P  Patient is admitted for left total hip arthroplasty.  Subjective:  Chief Complaint: left hip pain  HPI: Leslie Knapp, 71 y.o. female, has a history of pain and functional disability in the left hip(s) due to arthritis and patient has failed non-surgical conservative treatments for greater than 12 weeks to include NSAID's and/or analgesics, corticosteriod injections, flexibility and strengthening excercises, use of assistive devices, weight reduction as appropriate, and activity modification.  Onset of symptoms was gradual starting 2 years ago with gradually worsening course since that time.The patient noted no past surgery on the left hip(s).  Patient currently rates pain in the left hip at 10 out of 10 with activity. Patient has night pain, worsening of pain with activity and weight bearing, pain that interfers with activities of daily living, and pain with passive range of motion. Patient has evidence of subchondral sclerosis, periarticular osteophytes, and joint space narrowing by imaging studies. This condition presents safety issues increasing the risk of falls.  There is no current active infection.  Patient Active Problem List   Diagnosis Date Noted   Unilateral primary osteoarthritis, left hip 11/24/2021   Status post total hip replacement, right 06/17/2021   Unilateral primary osteoarthritis, right hip 06/16/2021   Right bundle branch block 06/15/2021   Preoperative clearance 06/15/2021   Hyperlipidemia 06/15/2021   Posterior vitreous detachment of left eye 09/13/2020   Nuclear sclerotic cataract of both eyes 09/13/2020   Thickened endometrium 04/07/2020   Vaginal discharge 04/07/2020   S/P insertion of IVC (inferior vena caval) filter 06/17/2019   PFO with atrial septal aneurysm 03/21/2019   Aortic atherosclerosis ([place]) 03/17/2019   Brachial artery occlusion, 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   Abdominal pain 03/08/2019   Pure hypercholesterolemia 09/25/2017    Morbid obesity ([place]) 06/07/2017   Encounter for Medicare annual wellness exam 06/07/2017   Vitamin D deficiency 06/07/2017   IBS (irritable bowel syndrome) 08/29/2016   Migraine 08/29/2016   Disorder of musculoskeletal system 03/23/2015   Eye exam abnormal 03/23/2015   NASH (nonalcoholic steatohepatitis) 10/19/2011   Past Medical History:  Diagnosis Date   Acute saddle pulmonary embolism ([place]) 82/5053   Complication of anesthesia    wants neck supported during surgery due to hx of migraine caused by concussion   Concussion 2010, 2000 and 1998   periodiotic migraine and occ dizzines   COVID-19 09/2019   sx covid pneumonia,fever, chills body aches, took monoclonal antibody tx done symptoms resolved after 6 weeks   CTS (carpal tunnel syndrome)    Fatty liver disease, nonalcoholic    In [facility] Study liver biopsy 2010 did not show fatty liver   Fibromyalgia    History of blood transfusion 1975   after knee surgery   IBS (irritable bowel syndrome)    C/D   Localized swelling of both lower legs    go down with propping up of legs   Morbid obesity ([place])    Osteoarthritis    oa   Pneumonia 09/2019   PONV (postoperative nausea and vomiting)    watch neck position needs support or gets n/v   Thyroid nodule    sees ent dr Radene Journey for q year   Uterine polyp     Past Surgical History:  Procedure Laterality Date   CARDIAC CATHETERIZATION  03/21/2019   with pfo closure   HYSTEROSCOPY WITH D & C N/A 04/06/2020   Procedure: DILATATION AND CURETTAGE /HYSTEROSCOPY;  Surgeon: Lavonia Drafts, MD;  Location: [place];  Service: Gynecology;  Laterality: N/A;   IVC FILTER INSERTION  07/07/2019   IVC FILTER REMOVAL     2021   KNEE SURGERY  1975   Left Patella Tendon Replacement   LIVER BIOPSY  2010   PATENT FORAMEN OVALE(PFO) CLOSURE  03/21/2019   right arm blood clot removal     right knee surgery      TONSILLECTOMY  1963   TOTAL HIP ARTHROPLASTY Right  06/17/2021   Procedure: RIGHT TOTAL HIP ARTHROPLASTY ANTERIOR APPROACH;  Surgeon: Mcarthur Rossetti, MD;  Location: [facility];  Service: Orthopedics;  Laterality: Right;   TOTAL KNEE ARTHROPLASTY  12/2003   Left   TOTAL KNEE ARTHROPLASTY  05/2004   Right   UTERINE POLYP REMOVAL  2010    No current facility-administered medications for this encounter.   Current Outpatient Medications  Medication Sig Dispense Refill Last Dose   apixaban (ELIQUIS) 2.5 MG TABS tablet Take 1 tablet (2.5 mg total) by mouth 2 (two) times daily. 60 tablet 12    Cholecalciferol 25 MCG/0.03ML LIQD Take 1,000 Units by mouth daily.      Digestive Enzymes (ENZYME DIGEST) CAPS Take 1 capsule by mouth daily as needed (heartburn).      Trace Min [id number] (MINERALS PO) Take 1 capsule by mouth daily.      ibuprofen (ADVIL) 200 MG tablet Take 400 mg by mouth at bedtime as needed.      Allergies  Allergen Reactions   Celebrex [Celecoxib] Swelling and Rash   Lactose Intolerance (Gi) Other (See Comments)    Social History   Tobacco Use   Smoking status: Never   Smokeless tobacco: Never  Substance Use Topics   Alcohol use: No    Alcohol/week: 0.0 standard drinks    Family History  Problem Relation Age of Onset   Stroke Mother 64       Deceased   Hypertension Mother    GI problems Mother    Arthritis Mother    Dementia Mother    Heart defect Father 49       Deceased   Parkinson's disease Father    Varicose Veins Father    Stroke Maternal Grandfather    Arthritis Maternal Grandfather    Kidney failure Maternal Grandmother    Gallbladder disease Maternal Grandmother    Diverticulitis Sister        #1   Arthritis Sister    Healthy Sister        #2     Review of Systems  Musculoskeletal:  Positive for gait problem.  All other systems reviewed and are negative.  Objective:  Physical Exam Vitals reviewed.  Constitutional:      Appearance: Normal appearance.  HENT:     Head:  Normocephalic and atraumatic.  Eyes:     Extraocular Movements: Extraocular movements intact.     Pupils: Pupils are equal, round, and reactive to light.  Cardiovascular:     Rate and Rhythm: Normal rate.  Pulmonary:     Effort: Pulmonary effort is normal.     Breath sounds: Normal breath sounds.  Abdominal:     Palpations: Abdomen is soft.  Musculoskeletal:     Cervical back: Normal range of motion and neck supple.     Left hip: Tenderness and bony tenderness present. Decreased range of motion. Decreased strength.  Neurological:     Mental Status: She is alert and oriented to person, place, and time.  Psychiatric:        Behavior:  Behavior normal.    Vital signs in last 24 hours:    Labs:   Estimated body mass index is 43.39 kg/m as calculated from the following:   Height as of 11/17/21: '5\' 9"'$  (1.753 m).   Weight as of 11/22/21: 133.3 kg.   Imaging Review Plain radiographs demonstrate severe degenerative joint disease of the left hip(s). The bone quality appears to be good for age and reported activity level.      Assessment/Plan:  End stage arthritis, left hip(s)  The patient history, physical examination, clinical judgement of the provider and imaging studies are consistent with end stage degenerative joint disease of the left hip(s) and total hip arthroplasty is deemed medically necessary. The treatment options including medical management, injection therapy, arthroscopy and arthroplasty were discussed at length. The risks and benefits of total hip arthroplasty were presented and reviewed. The risks due to aseptic loosening, infection, stiffness, dislocation/subluxation,  thromboembolic complications and other imponderables were discussed.  The patient acknowledged the explanation, agreed to proceed with the plan and consent was signed. Patient is being admitted for inpatient treatment for surgery, pain control, PT, OT, prophylactic antibiotics, VTE prophylaxis,  progressive ambulation and ADL's and discharge planning.The patient is planning to be discharged home with home health services

## 2021-11-25 ENCOUNTER — Ambulatory Visit (HOSPITAL_COMMUNITY): Payer: Medicare Other

## 2021-11-25 ENCOUNTER — Observation Stay (HOSPITAL_COMMUNITY): Payer: Medicare Other

## 2021-11-25 ENCOUNTER — Encounter (HOSPITAL_COMMUNITY): Payer: Self-pay | Admitting: Orthopaedic Surgery

## 2021-11-25 ENCOUNTER — Other Ambulatory Visit: Payer: Self-pay

## 2021-11-25 ENCOUNTER — Ambulatory Visit (HOSPITAL_COMMUNITY): Payer: Medicare Other | Admitting: Physician Assistant

## 2021-11-25 ENCOUNTER — Inpatient Hospital Stay (HOSPITAL_COMMUNITY)
Admission: RE | Admit: 2021-11-25 | Discharge: 2021-11-28 | DRG: 470 | Disposition: A | Discharge status: Home Health | Payer: Medicare Other | Attending: Orthopaedic Surgery | Admitting: Orthopaedic Surgery

## 2021-11-25 ENCOUNTER — Encounter (HOSPITAL_COMMUNITY)
Admission: RE | Disposition: A | Discharge status: Home Health | Payer: Self-pay | Source: Home / Self Care | Attending: Orthopaedic Surgery

## 2021-11-25 ENCOUNTER — Ambulatory Visit (HOSPITAL_BASED_OUTPATIENT_CLINIC_OR_DEPARTMENT_OTHER): Payer: Medicare Other | Admitting: Anesthesiology

## 2021-11-25 DIAGNOSIS — Z7901 Long term (current) use of anticoagulants: Secondary | ICD-10-CM

## 2021-11-25 DIAGNOSIS — Z888 Allergy status to other drugs, medicaments and biological substances status: Secondary | ICD-10-CM

## 2021-11-25 DIAGNOSIS — I700 Atherosclerosis of aorta: Secondary | ICD-10-CM | POA: Diagnosis present

## 2021-11-25 DIAGNOSIS — Z6841 Body Mass Index (BMI) 40.0 and over, adult: Secondary | ICD-10-CM | POA: Diagnosis not present

## 2021-11-25 DIAGNOSIS — E739 Lactose intolerance, unspecified: Secondary | ICD-10-CM | POA: Diagnosis present

## 2021-11-25 DIAGNOSIS — M797 Fibromyalgia: Secondary | ICD-10-CM

## 2021-11-25 DIAGNOSIS — Z96642 Presence of left artificial hip joint: Secondary | ICD-10-CM

## 2021-11-25 DIAGNOSIS — M1612 Unilateral primary osteoarthritis, left hip: Secondary | ICD-10-CM

## 2021-11-25 DIAGNOSIS — I951 Orthostatic hypotension: Secondary | ICD-10-CM | POA: Diagnosis not present

## 2021-11-25 DIAGNOSIS — K589 Irritable bowel syndrome without diarrhea: Secondary | ICD-10-CM | POA: Diagnosis present

## 2021-11-25 DIAGNOSIS — E7800 Pure hypercholesterolemia, unspecified: Secondary | ICD-10-CM | POA: Diagnosis present

## 2021-11-25 DIAGNOSIS — Z86711 Personal history of pulmonary embolism: Secondary | ICD-10-CM

## 2021-11-25 DIAGNOSIS — E559 Vitamin D deficiency, unspecified: Secondary | ICD-10-CM | POA: Diagnosis present

## 2021-11-25 DIAGNOSIS — G43909 Migraine, unspecified, not intractable, without status migrainosus: Secondary | ICD-10-CM | POA: Diagnosis present

## 2021-11-25 DIAGNOSIS — Z96641 Presence of right artificial hip joint: Secondary | ICD-10-CM | POA: Diagnosis present

## 2021-11-25 DIAGNOSIS — Z95828 Presence of other vascular implants and grafts: Secondary | ICD-10-CM

## 2021-11-25 DIAGNOSIS — Z8616 Personal history of COVID-19: Secondary | ICD-10-CM

## 2021-11-25 HISTORY — PX: TOTAL HIP ARTHROPLASTY: SHX124

## 2021-11-25 LAB — TYPE AND SCREEN
ABO/RH(D): O POS
Antibody Screen: NEGATIVE

## 2021-11-25 SURGERY — ARTHROPLASTY, HIP, TOTAL, ANTERIOR APPROACH
Anesthesia: Monitor Anesthesia Care | Site: Hip | Laterality: Left

## 2021-11-25 MED ORDER — DEXAMETHASONE SODIUM PHOSPHATE 10 MG/ML IJ SOLN
INTRAMUSCULAR | Status: AC
  Filled 2021-11-25: qty 1

## 2021-11-25 MED ORDER — ACETAMINOPHEN 325 MG PO TABS
325.0000 mg | ORAL_TABLET | Freq: Four times a day (QID) | ORAL | Status: DC | PRN
  Administered 2021-11-25: 650 mg via ORAL
  Filled 2021-11-25 (×3): qty 2

## 2021-11-25 MED ORDER — CEFAZOLIN IN SODIUM CHLORIDE 3-0.9 GM/100ML-% IV SOLN
3.0000 g | INTRAVENOUS | Status: AC
  Administered 2021-11-25: 3 g via INTRAVENOUS

## 2021-11-25 MED ORDER — AMISULPRIDE (ANTIEMETIC) 5 MG/2ML IV SOLN: 10.0000 mg | Freq: Once | INTRAVENOUS | Status: DC | PRN

## 2021-11-25 MED ORDER — EPHEDRINE 5 MG/ML INJ
INTRAVENOUS | Status: AC
  Filled 2021-11-25: qty 5

## 2021-11-25 MED ORDER — ONDANSETRON HCL 4 MG PO TABS: 4.0000 mg | ORAL_TABLET | Freq: Four times a day (QID) | ORAL | Status: DC | PRN

## 2021-11-25 MED ORDER — LACTATED RINGERS IV SOLN: INTRAVENOUS | Status: DC

## 2021-11-25 MED ORDER — APIXABAN 2.5 MG PO TABS
2.5000 mg | ORAL_TABLET | Freq: Two times a day (BID) | ORAL | Status: DC
  Administered 2021-11-26 – 2021-11-28 (×5): 2.5 mg via ORAL
  Filled 2021-11-25 (×5): qty 1

## 2021-11-25 MED ORDER — PROPOFOL 500 MG/50ML IV EMUL
INTRAVENOUS | Status: AC
  Filled 2021-11-25: qty 50

## 2021-11-25 MED ORDER — ACETAMINOPHEN 500 MG PO TABS: 1000.0000 mg | ORAL_TABLET | Freq: Once | ORAL | Status: AC

## 2021-11-25 MED ORDER — PROPOFOL 500 MG/50ML IV EMUL
INTRAVENOUS | Status: DC | PRN
  Administered 2021-11-25: 100 ug/kg/min via INTRAVENOUS

## 2021-11-25 MED ORDER — OXYCODONE HCL 5 MG/5ML PO SOLN: 5.0000 mg | Freq: Once | ORAL | Status: DC | PRN

## 2021-11-25 MED ORDER — METHOCARBAMOL 500 MG PO TABS
500.0000 mg | ORAL_TABLET | Freq: Four times a day (QID) | ORAL | Status: DC | PRN
  Administered 2021-11-25 – 2021-11-26 (×3): 500 mg via ORAL
  Filled 2021-11-25 (×3): qty 1

## 2021-11-25 MED ORDER — ONDANSETRON HCL 4 MG/2ML IJ SOLN: 4.0000 mg | Freq: Once | INTRAMUSCULAR | Status: DC | PRN

## 2021-11-25 MED ORDER — HYDROMORPHONE HCL 1 MG/ML IJ SOLN: 0.2500 mg | INTRAMUSCULAR | Status: DC | PRN

## 2021-11-25 MED ORDER — LIDOCAINE HCL (PF) 2 % IJ SOLN
INTRAMUSCULAR | Status: AC
  Filled 2021-11-25: qty 5

## 2021-11-25 MED ORDER — BUPIVACAINE IN DEXTROSE 0.75-8.25 % IT SOLN
INTRATHECAL | Status: DC | PRN
Start: 2021-11-25 — End: 2021-11-25
  Administered 2021-11-25: 1.8 mL via INTRATHECAL

## 2021-11-25 MED ORDER — OXYCODONE HCL 5 MG PO TABS: 5.0000 mg | ORAL_TABLET | ORAL | Status: DC | PRN

## 2021-11-25 MED ORDER — SORBITOL 70 % SOLN: 30.0000 mL | Freq: Every day | Status: DC | PRN

## 2021-11-25 MED ORDER — PHENOL 1.4 % MT LIQD: 1.0000 | OROMUCOSAL | Status: DC | PRN

## 2021-11-25 MED ORDER — PROPOFOL 10 MG/ML IV BOLUS
INTRAVENOUS | Status: AC
  Filled 2021-11-25: qty 20

## 2021-11-25 MED ORDER — CEFAZOLIN IN SODIUM CHLORIDE 3-0.9 GM/100ML-% IV SOLN
INTRAVENOUS | Status: AC
  Filled 2021-11-25: qty 100

## 2021-11-25 MED ORDER — OXYCODONE HCL 5 MG PO TABS: 5.0000 mg | ORAL_TABLET | Freq: Once | ORAL | Status: DC | PRN

## 2021-11-25 MED ORDER — DIPHENHYDRAMINE HCL 12.5 MG/5ML PO ELIX
12.5000 mg | ORAL_SOLUTION | ORAL | Status: DC | PRN
  Administered 2021-11-26: 25 mg via ORAL
  Filled 2021-11-25: qty 10

## 2021-11-25 MED ORDER — TRANEXAMIC ACID-NACL 1000-0.7 MG/100ML-% IV SOLN
INTRAVENOUS | Status: AC
  Filled 2021-11-25: qty 100

## 2021-11-25 MED ORDER — CEFAZOLIN SODIUM-DEXTROSE 2-4 GM/100ML-% IV SOLN
2.0000 g | Freq: Four times a day (QID) | INTRAVENOUS | Status: AC
  Administered 2021-11-25 (×2): 2 g via INTRAVENOUS
  Filled 2021-11-25 (×2): qty 100

## 2021-11-25 MED ORDER — ONDANSETRON HCL 4 MG/2ML IJ SOLN
INTRAMUSCULAR | Status: AC
  Filled 2021-11-25: qty 2

## 2021-11-25 MED ORDER — PHENYLEPHRINE 80 MCG/ML (10ML) SYRINGE FOR IV PUSH (FOR BLOOD PRESSURE SUPPORT)
PREFILLED_SYRINGE | INTRAVENOUS | Status: DC | PRN
  Administered 2021-11-25 (×5): 160 ug via INTRAVENOUS

## 2021-11-25 MED ORDER — ALUM & MAG HYDROXIDE-SIMETH 200-200-20 MG/5ML PO SUSP: 30.0000 mL | ORAL | Status: DC | PRN

## 2021-11-25 MED ORDER — METOCLOPRAMIDE HCL 5 MG PO TABS: 5.0000 mg | ORAL_TABLET | Freq: Three times a day (TID) | ORAL | Status: DC | PRN

## 2021-11-25 MED ORDER — SODIUM CHLORIDE 0.9 % IV SOLN: INTRAVENOUS | Status: DC

## 2021-11-25 MED ORDER — METHOCARBAMOL 500 MG IVPB - SIMPLE MED: 500.0000 mg | Freq: Four times a day (QID) | INTRAVENOUS | Status: DC | PRN

## 2021-11-25 MED ORDER — ACETAMINOPHEN 500 MG PO TABS
ORAL_TABLET | ORAL | Status: AC
  Administered 2021-11-25: 1000 mg via ORAL
  Filled 2021-11-25: qty 2

## 2021-11-25 MED ORDER — DEXAMETHASONE SODIUM PHOSPHATE 10 MG/ML IJ SOLN
INTRAMUSCULAR | Status: DC | PRN
  Administered 2021-11-25: 10 mg via INTRAVENOUS

## 2021-11-25 MED ORDER — ONDANSETRON HCL 4 MG/2ML IJ SOLN
INTRAMUSCULAR | Status: DC | PRN
  Administered 2021-11-25: 4 mg via INTRAVENOUS

## 2021-11-25 MED ORDER — TRANEXAMIC ACID-NACL 1000-0.7 MG/100ML-% IV SOLN
1000.0000 mg | INTRAVENOUS | Status: AC
  Administered 2021-11-25: 1000 mg via INTRAVENOUS

## 2021-11-25 MED ORDER — ORAL CARE MOUTH RINSE: 15.0000 mL | Freq: Once | OROMUCOSAL | Status: AC

## 2021-11-25 MED ORDER — VITAMIN D 25 MCG (1000 UNIT) PO TABS
1000.0000 [IU] | ORAL_TABLET | Freq: Every day | ORAL | Status: DC
  Administered 2021-11-26 – 2021-11-28 (×3): 1000 [IU] via ORAL
  Filled 2021-11-25 (×3): qty 1

## 2021-11-25 MED ORDER — CHLORHEXIDINE GLUCONATE 0.12 % MT SOLN
15.0000 mL | Freq: Once | OROMUCOSAL | Status: AC
  Administered 2021-11-25: 15 mL via OROMUCOSAL

## 2021-11-25 MED ORDER — SODIUM CHLORIDE 0.9 % IR SOLN
Status: DC | PRN
  Administered 2021-11-25: 1000 mL

## 2021-11-25 MED ORDER — HYDROMORPHONE HCL 1 MG/ML IJ SOLN: 0.5000 mg | INTRAMUSCULAR | Status: DC | PRN

## 2021-11-25 MED ORDER — METOCLOPRAMIDE HCL 5 MG/ML IJ SOLN: 5.0000 mg | Freq: Three times a day (TID) | INTRAMUSCULAR | Status: DC | PRN

## 2021-11-25 MED ORDER — PROPOFOL 10 MG/ML IV BOLUS
INTRAVENOUS | Status: DC | PRN
  Administered 2021-11-25: 20 mg via INTRAVENOUS
  Administered 2021-11-25: 10 mg via INTRAVENOUS
  Administered 2021-11-25: 20 mg via INTRAVENOUS

## 2021-11-25 MED ORDER — POVIDONE-IODINE 10 % EX SWAB
2.0000 "application " | Freq: Once | CUTANEOUS | Status: AC
  Administered 2021-11-25: 2 via TOPICAL

## 2021-11-25 MED ORDER — CHOLECALCIFEROL 25 MCG/0.03ML PO LIQD: 1000.0000 [IU] | Freq: Every day | ORAL | Status: DC

## 2021-11-25 MED ORDER — MENTHOL 3 MG MT LOZG: 1.0000 | LOZENGE | OROMUCOSAL | Status: DC | PRN

## 2021-11-25 MED ORDER — POLYETHYLENE GLYCOL 3350 17 G PO PACK: 17.0000 g | PACK | Freq: Every day | ORAL | Status: DC | PRN

## 2021-11-25 MED ORDER — EPHEDRINE SULFATE-NACL 50-0.9 MG/10ML-% IV SOSY
PREFILLED_SYRINGE | INTRAVENOUS | Status: DC | PRN
  Administered 2021-11-25 (×3): 5 mg via INTRAVENOUS

## 2021-11-25 MED ORDER — ONDANSETRON HCL 4 MG/2ML IJ SOLN: 4.0000 mg | Freq: Four times a day (QID) | INTRAMUSCULAR | Status: DC | PRN

## 2021-11-25 MED ORDER — PANTOPRAZOLE SODIUM 40 MG PO TBEC
40.0000 mg | DELAYED_RELEASE_TABLET | Freq: Every day | ORAL | Status: DC
  Filled 2021-11-25 (×2): qty 1

## 2021-11-25 MED ORDER — DOCUSATE SODIUM 100 MG PO CAPS
100.0000 mg | ORAL_CAPSULE | Freq: Two times a day (BID) | ORAL | Status: DC
  Administered 2021-11-26 – 2021-11-28 (×5): 100 mg via ORAL
  Filled 2021-11-25 (×6): qty 1

## 2021-11-25 MED ORDER — PROPOFOL 1000 MG/100ML IV EMUL
INTRAVENOUS | Status: AC
  Filled 2021-11-25: qty 100

## 2021-11-25 MED ORDER — 0.9 % SODIUM CHLORIDE (POUR BTL) OPTIME
TOPICAL | Status: DC | PRN
  Administered 2021-11-25: 1000 mL

## 2021-11-25 MED ORDER — HYDROCODONE-ACETAMINOPHEN 5-325 MG PO TABS
1.0000 | ORAL_TABLET | ORAL | Status: DC | PRN
  Administered 2021-11-26: 1 via ORAL
  Filled 2021-11-25: qty 1

## 2021-11-25 MED ORDER — PHENYLEPHRINE 80 MCG/ML (10ML) SYRINGE FOR IV PUSH (FOR BLOOD PRESSURE SUPPORT)
PREFILLED_SYRINGE | INTRAVENOUS | Status: AC
Start: 2021-11-25 — End: ?
  Filled 2021-11-25: qty 10

## 2021-11-25 MED ORDER — OXYCODONE HCL 5 MG PO TABS: 10.0000 mg | ORAL_TABLET | ORAL | Status: DC | PRN

## 2021-11-25 SURGICAL SUPPLY — 43 items
ACETAB CUP W/GRIPTION 54 (Plate) ×2 IMPLANT
ARTICULEZE HEAD (Hips) ×2 IMPLANT
BAG COUNTER SPONGE SURGICOUNT (BAG) ×2 IMPLANT
BAG ZIPLOCK 12X15 (MISCELLANEOUS) ×1 IMPLANT
BENZOIN TINCTURE PRP APPL 2/3 (GAUZE/BANDAGES/DRESSINGS) IMPLANT
BLADE SAW SGTL 18X1.27X75 (BLADE) ×2 IMPLANT
COVER PERINEAL POST (MISCELLANEOUS) ×2 IMPLANT
COVER SURGICAL LIGHT HANDLE (MISCELLANEOUS) ×2 IMPLANT
CUP ACETAB W/GRIPTION 54 (Plate) IMPLANT
DRAPE FOOT SWITCH (DRAPES) ×2 IMPLANT
DRAPE STERI IOBAN 125X83 (DRAPES) ×2 IMPLANT
DRAPE U-SHAPE 47X51 STRL (DRAPES) ×4 IMPLANT
DRESSING AQUACEL AG SP 3.5X10 (GAUZE/BANDAGES/DRESSINGS) IMPLANT
DRSG AQUACEL AG ADV 3.5X10 (GAUZE/BANDAGES/DRESSINGS) ×2 IMPLANT
DRSG AQUACEL AG SP 3.5X10 (GAUZE/BANDAGES/DRESSINGS) ×2
DURAPREP 26ML APPLICATOR (WOUND CARE) ×2 IMPLANT
ELECT REM PT RETURN 15FT ADLT (MISCELLANEOUS) ×2 IMPLANT
GAUZE XEROFORM 1X8 LF (GAUZE/BANDAGES/DRESSINGS) ×2 IMPLANT
GLOVE BIO SURGEON STRL SZ7.5 (GLOVE) ×2 IMPLANT
GLOVE BIOGEL PI IND STRL 8 (GLOVE) ×2 IMPLANT
GLOVE BIOGEL PI INDICATOR 8 (GLOVE) ×2
GLOVE ECLIPSE 8.0 STRL XLNG CF (GLOVE) ×2 IMPLANT
GOWN STRL REUS W/ TWL XL LVL3 (GOWN DISPOSABLE) ×2 IMPLANT
GOWN STRL REUS W/TWL XL LVL3 (GOWN DISPOSABLE) ×4
HANDPIECE INTERPULSE COAX TIP (DISPOSABLE) ×2
HEAD ARTICULEZE (Hips) IMPLANT
HOLDER FOLEY CATH W/STRAP (MISCELLANEOUS) ×2 IMPLANT
KIT TURNOVER KIT A (KITS) ×1 IMPLANT
LINER NEUTRAL 54X36MM PLUS 4 (Hips) ×1 IMPLANT
PACK ANTERIOR HIP CUSTOM (KITS) ×2 IMPLANT
SET HNDPC FAN SPRY TIP SCT (DISPOSABLE) ×1 IMPLANT
STAPLER VISISTAT 35W (STAPLE) ×1 IMPLANT
STEM FEMORAL SZ9 STD ACTIS (Stem) ×1 IMPLANT
STRIP CLOSURE SKIN 1/2X4 (GAUZE/BANDAGES/DRESSINGS) IMPLANT
SUT ETHIBOND NAB CT1 #1 30IN (SUTURE) ×2 IMPLANT
SUT ETHILON 2 0 PS N (SUTURE) IMPLANT
SUT MNCRL AB 4-0 PS2 18 (SUTURE) IMPLANT
SUT VIC AB 0 CT1 36 (SUTURE) ×2 IMPLANT
SUT VIC AB 1 CT1 36 (SUTURE) ×2 IMPLANT
SUT VIC AB 2-0 CT1 27 (SUTURE) ×10
SUT VIC AB 2-0 CT1 TAPERPNT 27 (SUTURE) ×2 IMPLANT
TRAY FOLEY MTR SLVR 16FR STAT (SET/KITS/TRAYS/PACK) ×1 IMPLANT
YANKAUER SUCT BULB TIP NO VENT (SUCTIONS) ×2 IMPLANT

## 2021-11-25 NOTE — Anesthesia Procedure Notes (Signed)
Spinal  Patient location during procedure: OR Start time: 11/25/2021 9:42 AM End time: 11/25/2021 9:52 AM Reason for block: surgical anesthesia Staffing Performed: anesthesiologist  Anesthesiologist: Pervis Hocking, DO Preanesthetic Checklist Completed: patient identified, IV checked, risks and benefits discussed, surgical consent, monitors and equipment checked, pre-op evaluation and timeout performed Spinal Block Patient position: sitting Prep: DuraPrep and site prepped and draped Patient monitoring: cardiac monitor, continuous pulse ox and blood pressure Approach: midline Location: L3-4 Injection technique: single-shot Needle Needle type: Pencan  Needle gauge: 24 G Needle length: 12.7 cm Assessment Sensory level: T6 Events: CSF return and second provider Additional Notes Functioning IV was confirmed and monitors were applied. Sterile prep and drape, including hand hygiene and sterile gloves were used. The patient was positioned and the spine was prepped. The skin was anesthetized with lidocaine.  Free flow of clear CSF was obtained prior to injecting local anesthetic into the CSF.  The spinal needle aspirated freely following injection.  The needle was carefully withdrawn.  The patient tolerated the procedure well.    Longer spinal needle required

## 2021-11-25 NOTE — Transfer of Care (Signed)
Immediate Anesthesia Transfer of Care Note  Patient: Leslie Knapp  Procedure(s) Performed: LEFT TOTAL HIP ARTHROPLASTY ANTERIOR APPROACH (Left: Hip)  Patient Location: PACU  Anesthesia Type:Spinal  Level of Consciousness: awake, alert  and oriented  Airway & Oxygen Therapy: Patient Spontanous Breathing and Patient connected to face mask oxygen  Post-op Assessment: Report given to RN and Post -op Vital signs reviewed and stable  Post vital signs: Reviewed and stable  Last Vitals:  Vitals Value Taken Time  BP 108/62 11/25/21 1145  Temp    Pulse 75 11/25/21 1149  Resp 18 11/25/21 1149  SpO2 100 % 11/25/21 1149  Vitals shown include unvalidated device data.  Last Pain:  Vitals:   11/25/21 0723  TempSrc:   PainSc: 0-No pain         Complications: No notable events documented.

## 2021-11-25 NOTE — Anesthesia Postprocedure Evaluation (Signed)
Anesthesia Post Note  Patient: Leslie Knapp  Procedure(s) Performed: LEFT TOTAL HIP ARTHROPLASTY ANTERIOR APPROACH (Left: Hip)     Patient location during evaluation: PACU Anesthesia Type: MAC and Spinal Level of consciousness: awake and alert and oriented Pain management: pain level controlled Vital Signs Assessment: post-procedure vital signs reviewed and stable Respiratory status: spontaneous breathing, nonlabored ventilation and respiratory function stable Cardiovascular status: blood pressure returned to baseline and stable Postop Assessment: no headache, no backache, spinal receding and no apparent nausea or vomiting Anesthetic complications: no   No notable events documented.  Last Vitals:  Vitals:   11/25/21 1300 11/25/21 1315  BP: 134/80 130/78  Pulse: 74 78  Resp: 12 13  Temp: (!) 36.4 C 36.4 C  SpO2: 100% 100%    Last Pain:  Vitals:   11/25/21 1315  TempSrc:   PainSc: 0-No pain                 Pervis Hocking

## 2021-11-25 NOTE — Op Note (Signed)
NAME: Leslie Knapp, Leslie Knapp MEDICAL RECORD NO: [id number] ACCOUNT NO: [id number] DATE OF BIRTH: 1951/02/25 FACILITY: [facility] LOCATION: [facility] PH[facility]ICIAN: Lind Guest. Ninfa Linden, MD  Operative Report   DATE OF PROCEDURE: 11/25/2021  PREOPERATIVE DIAGNOSIS:  Primary osteoarthritis and degenerative joint disease, left hip.  POSTOPERATIVE DIAGNOSIS:  Primary osteoarthritis and degenerative joint disease, left hip.  PROCEDURE:  Left total hip arthroplasty through direct anterior approach.  IMPLANTS:  DePuy sector Gription acetabular component size 54, size 36+4 neutral polyethylene liner, size 9 ACTIS femoral component with standard offset, size 36+5 metal hip ball.  SURGEON:  Lind Guest. Ninfa Linden, MD  ASSISTANT:  Annie Main, PA-C  ANTIBIOTICS:  3 g IV Ancef.  ESTIMATED BLOOD LOSS:  443 mL  COMPLICATIONS:  None.  INDICATIONS:  The patient is a 71 year old female with debilitating arthritis involving her left hip.  I have actually replaced her right hip within the last year.  Her left hip pain is daily and is detrimentally affecting her mobility, her quality of  life and her activities of daily living to the point she does wish to proceed with a total hip arthroplasty on the left side.  She was pleased with successful right hip replacement.  She knows that given her significant morbid obesity that there is a  heightened risk of acute blood loss anemia, especially skin and soft tissue issues and wound breakdown, fracture, infection, dislocation, DVT, implant failure and leg length differences.  She understands our goals are hopefully decrease pain, improve  mobility and overall improve quality of life.  DESCRIPTION OF PROCEDURE:  After informed consent was obtained, appropriate left operative hip was marked.  She was brought to the operating room and sat up on the stretcher where spinal anesthesia was obtained.  She was laid in the supine position on  the stretcher.  Foley catheter was  placed and traction boots were placed on both her feet.  Next, she was placed supine on the Hana fracture table, the perineal post in place and both legs in line with skeletal traction device and no traction applied.   Her left operative hip was prepped and draped with DuraPrep and sterile drapes.  A timeout was called.  She was identified correct patient, correct left hip.  I then made an incision just inferior and posterior to the anterior superior iliac spine and  carried this obliquely down the leg.  We dissected down tensor fascia lata muscle.  Tensor fascia was then divided longitudinally to proceed with direct anterior approach to the hip.  We identified and cauterized circumflex vessels, then identified the  hip capsule, opened the hip capsule in L-type format finding a moderate joint effusion.  We placed curved retractors around the medial and lateral femoral neck and made a femoral neck cut with an oscillating saw just proximal to the lesser trochanter and  completed this with an osteotome.  I placed a corkscrew guide in the femoral head and removed the femoral head in its entirety and found a wide area devoid of cartilage.  I then placed a bent Hohmann over the medial acetabular rim and removed remnants  of the acetabular labrum and other debris.  I then began reaming under direct visualization from a size 43 reamer in a stepwise increments going up to a size 54 reamer with all reamers under direct visualization.  Last reamer under direct fluoroscopy so  I obtained my depth of reaming and my inclination and anteversion.  I then placed real DePuy sector Gription acetabular component size 54  and a 36+4 polyethylene liner for that size 54 acetabular component.  Attention was then turned to the femur.  With  the leg externally rotated 120 degrees and extended and adducted we were able to place a Mueller retractor medially and Hohman retractor behind the greater trochanter.  We released lateral joint  capsule and used a box cutting osteotome to enter femoral  canal and a rongeur to lateralize, I then began broaching using the ACTIS broaching system from a size 0 going to a size 9.  With a size 9 in place, we trialed a standard offset femoral neck and a 36+1.5 hip ball, reduced this in acetabulum and we were  pleased with offset, but I felt like I needed just a little more leg length.  We dislocated the hip and removed the trial components.  I placed the real ACTIS femoral component, size 9 with standard offset and the real 36+5 metal hip ball and again  reduced this in acetabulum and it was stable.  We assessed it radiographically and clinically.  We then assessed the greater trochanter due to having what the x-rays looked it appeared that there was a greater trochanteric fracture, but as I put her  through internal rotation and external rotation this proved not to be the case as well as assessing her clinically.  We then irrigated the soft tissue with normal saline solution using pulsatile lavage.  We closed the joint capsule with interrupted #1  Ethibond suture followed by #1 Vicryl to close the tensor fascia.  0 Vicryl to close the deep tissue and 2-0 Vicryl was used to close subcutaneous tissue.  The skin was closed with staples.  An Aquacel dressing was applied.  She was taken to recovery  room in stable condition with all final counts being correct.  No complications noted.  Of note, Sara Lee, PA-C, assisted during the entire case from beginning to end and his assistance was crucial and medically necessary for helping retract soft  tissues as well as guiding implant placement.  He was involved with a layered closure of the wound.   PUS D: 11/25/2021 11:00:19 am T: 11/25/2021 12:49:00 pm  JOB: [id number]/ [id number]

## 2021-11-25 NOTE — Evaluation (Signed)
Physical Therapy Evaluation Patient Details Name: Leslie Knapp MRN: [id number] DOB: [birth year] Today's Date: 11/25/2021  History of Present Illness  Pt is a 71yo female presenting s/p L-THA, AA on 11/25/21. PMH: fibryomylaiga, IBS, OA, post-concussion, b/l TKA 2005, R-THA 2022, RBBB.  Clinical Impression  Leslie Knapp is a 71 y.o. female POD 0 s/p L-THA, AA. Patient reports modified independence using RW for mobility at baseline with assistance to don/doff socks & shoes. Patient is now limited by functional impairments (see PT problem list below) and requires min assist for transfers and gait with RW. Patient was able to ambulate 25 feet with RW and min guard assist. Patient instructed in exercise to facilitate ROM and circulation to manage edema. Patient will benefit from continued skilled PT interventions to address impairments and progress towards PLOF. Acute PT will follow to progress mobility in preparation for safe discharge home.       Recommendations for follow up therapy are one component of a multi-disciplinary discharge planning process, led by the attending physician.  Recommendations may be updated based on patient status, additional functional criteria and insurance authorization.  Follow Up Recommendations Follow physician's recommendations for discharge plan and follow up therapies    Assistance Recommended at Discharge Intermittent Supervision/Assistance  Patient can return home with the following  A little help with walking and/or transfers;A little help with bathing/dressing/bathroom;Assistance with cooking/housework;Assist for transportation;Help with stairs or ramp for entrance    Equipment Recommendations None recommended by PT (pt has required DME)  Recommendations for Other Services       Functional Status Assessment Patient has had a recent decline in their functional status and demonstrates the ability to make significant improvements in function in a reasonable  and predictable amount of time.     Precautions / Restrictions Precautions Precautions: Fall Restrictions Weight Bearing Restrictions: No LLE Weight Bearing: Weight bearing as tolerated      Mobility  Bed Mobility Overal bed mobility: Needs Assistance Bed Mobility: Supine to Sit, Sit to Supine     Supine to sit: Mod assist Sit to supine: Mod assist   General bed mobility comments: Pt brought maxi slide bedsheets and yoga straps from home to aid her in bed mobility citing "my core is so weak." Pt required mod assist to bring BLE off and on bed for bed mobility, otherwise min guard. Pt was able to assist in scooting up in bed at end of session. Refused to sit in the recliner despite multiple attemps.    Transfers Overall transfer level: Needs assistance Equipment used: Rolling walker (2 wheels) Transfers: Sit to/from Stand Sit to Stand: From elevated surface, Min assist           General transfer comment: Pt min assist for lift assist from elevated surface.    Ambulation/Gait Ambulation/Gait assistance: Min guard, +2 safety/equipment Gait Distance (Feet): 25 Feet Assistive device: Rolling walker (2 wheels) Gait Pattern/deviations: Step-to pattern, Antalgic Gait velocity: decreased     General Gait Details: Pt ambulated with RW and min guard assist, no physical assist required +2 for recliner follow only,  no overt LOB. Pt demonstrated antalgia during weightbearing on LLE, and refused to sit in recliner at end of gait task but turned around and walked back to the EOB.  Stairs            Wheelchair Mobility    Modified Rankin (Stroke Patients Only)       Balance Overall balance assessment: Needs assistance Sitting-balance support: Feet supported, No upper  extremity supported Sitting balance-Leahy Scale: Good     Standing balance support: Reliant on assistive device for balance, During functional activity, Bilateral upper extremity supported Standing  balance-Leahy Scale: Poor                               Pertinent Vitals/Pain Pain Assessment Pain Assessment: 0-10 Pain Score: 5  Pain Location: L hip Pain Descriptors / Indicators: Operative site guarding, Discomfort, Crying, Guarding, Grimacing Pain Intervention(s): Limited activity within patient's tolerance, Monitored during session, Repositioned, Ice applied    Home Living Family/patient expects to be discharged to:: Private residence Living Arrangements: Other relatives Available Help at Discharge: Family;Available 24 hours/day Type of Home: House Home Access: Ramped entrance       Home Layout: One level Home Equipment: Grab bars - tub/shower;Grab bars - toilet;Rolling Walker (2 wheels);BSC/3in1      Prior Function Prior Level of Function : Independent/Modified Independent             Mobility Comments: uses RW at all times ADLs Comments: Assistance for donning and doffing socks,     Hand Dominance        Extremity/Trunk Assessment   Upper Extremity Assessment Upper Extremity Assessment: Overall WFL for tasks assessed    Lower Extremity Assessment Lower Extremity Assessment: RLE deficits/detail;LLE deficits/detail RLE Deficits / Details: MMT ank PF/DF 4/5 RLE Sensation: WNL LLE Deficits / Details: MMT ank PF/DF 4/5 LLE Sensation: WNL    Cervical / Trunk Assessment Cervical / Trunk Assessment: Kyphotic  Communication   Communication: No difficulties  Cognition Arousal/Alertness: Awake/alert Behavior During Therapy: WFL for tasks assessed/performed Overall Cognitive Status: Within Functional Limits for tasks assessed                                          General Comments      Exercises Total Joint Exercises Ankle Circles/Pumps: AROM, Both, 20 reps   Assessment/Plan    PT Assessment Patient needs continued PT services  PT Problem List Decreased strength;Decreased range of motion;Decreased activity  tolerance;Decreased balance;Decreased mobility;Decreased coordination;Pain       PT Treatment Interventions DME instruction;Gait training;Stair training;Functional mobility training;Therapeutic activities;Therapeutic exercise;Balance training;Neuromuscular re-education;Patient/family education    PT Goals (Current goals can be found in the Care Plan section)  Acute Rehab PT Goals Patient Stated Goal: To walk with less pain PT Goal Formulation: With patient Time For Goal Achievement: 12/02/21 Potential to Achieve Goals: Good    Frequency 7X/week     Co-evaluation               AM-PAC PT "6 Clicks" Mobility  Outcome Measure Help needed turning from your back to your side while in a flat bed without using bedrails?: A Little Help needed moving from lying on your back to sitting on the side of a flat bed without using bedrails?: A Little Help needed moving to and from a bed to a chair (including a wheelchair)?: A Little Help needed standing up from a chair using your arms (e.g., wheelchair or bedside chair)?: A Little Help needed to walk in hospital room?: A Little Help needed climbing 3-5 steps with a railing? : A Lot 6 Click Score: 17    End of Session Equipment Utilized During Treatment: Gait belt Activity Tolerance: Patient limited by pain Patient left: in bed;with call bell/phone within  reach;with bed alarm set;with nursing/sitter in room;with family/visitor present;with SCD's reapplied Nurse Communication: Mobility status PT Visit Diagnosis: Difficulty in walking, not elsewhere classified (R26.2);Pain Pain - Right/Left: Left Pain - part of body: Hip    Time: 9794-9971 PT Time Calculation (min) (ACUTE ONLY): 54 min   Charges:   PT Evaluation $PT Eval Low Complexity: 1 Low PT Treatments $Gait Training: 23-37 mins $Self Care/Home Management: 8-22      Coolidge Breeze, PT, DPT [facility] Department Office: [phone] Pager: [phone]  Coolidge Breeze 11/25/2021, 4:49 PM

## 2021-11-25 NOTE — Brief Op Note (Signed)
11/25/2021  11:01 AM  PATIENT:  Leslie Knapp  71 y.o. female  PRE-OPERATIVE DIAGNOSIS:  OSTEOARTHRITIS LEFT HIP  POST-OPERATIVE DIAGNOSIS:  OSTEOARTHRITIS LEFT HIP  PROCEDURE:  Procedure(s): LEFT TOTAL HIP ARTHROPLASTY ANTERIOR APPROACH (Left)  SURGEON:  Surgeon(s) and Role:    * Mcarthur Rossetti, MD - Primary  PHYSICIAN ASSISTANT:  Annie Main, PA-C  ANESTHESIA:   spinal  EBL:  250 mL   COUNTS:  YES  DICTATION: .Other Dictation: Dictation Number [id number]  PLAN OF CARE: Admit for overnight observation  PATIENT DISPOSITION:  PACU - hemodynamically stable.   Delay start of Pharmacological VTE agent (>24hrs) due to surgical blood loss or risk of bleeding: no

## 2021-11-25 NOTE — Anesthesia Preprocedure Evaluation (Addendum)
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 Left ventricular ejection fraction, by estimation, is 60 to 65%. Left  ventricular ejection fraction by 3D volume is 63 %. The left ventricle has  normal function. The left ventricle has no regional wall motion  abnormalities. There is mild concentric  left ventricular hypertrophy. Left ventricular diastolic parameters are  consistent with Grade I diastolic dysfunction (impaired relaxation).  2. Right ventricular systolic function is normal. The right ventricular  size is normal. Tricuspid regurgitation signal is inadequate for assessing  PA pressure.  3. The mitral valve is normal in structure. Trivial mitral valve  regurgitation. No evidence of mitral stenosis.  4. The aortic valve is tricuspid. There is mild thickening of the aortic  valve. Aortic valve regurgitation is trivial. Aortic valve sclerosis is  present, with no evidence of aortic valve stenosis.  5. The inferior vena cava is normal in size with greater than 50%  respiratory variability, suggesting right atrial pressure of 3 mmHg.    Neuro/Psych  Headaches, negative psych ROS   GI/Hepatic negative GI ROS, Neg liver ROS,   Endo/Other  Morbid obesityBMI 44  Renal/GU negative Renal ROS      Musculoskeletal  (+) Arthritis , Osteoarthritis,  Fibromyalgia -Per pt bowels shut down after last hip surgery requiring readmission about 1 week postop. No immediate postop issues    Abdominal (+) + obese,   Peds  Hematology Hb 16, plt 207   Anesthesia Other Findings   Reproductive/Obstetrics negative OB ROS                            Anesthesia Physical Anesthesia Plan  ASA: 3  Anesthesia Plan: MAC and Spinal   Post-op Pain Management: Tylenol PO (pre-op)*   Induction:   PONV Risk Score and Plan: 2 and Propofol infusion and TIVA  Airway Management Planned: Natural Airway and Simple Face Mask  Additional Equipment: None  Intra-op Plan:   Post-operative Plan:   Informed Consent: I have reviewed the patients History and Physical, chart, labs and discussed the procedure including the risks, benefits and alternatives for the proposed anesthesia with the patient or authorized representative who has indicated his/her understanding and acceptance.     Dental advisory given  Plan Discussed with: CRNA  Anesthesia Plan Comments:        Anesthesia Quick Evaluation

## 2021-11-25 NOTE — Interval H&P Note (Signed)
History and Physical Interval Note: The patient understands that she is here today for a left hip replacement to treat her left hip osteoarthritis.  There has been no acute or interval change in her medical status.  Please see H&P.  The risks and benefits of surgery been explained in detail and informed consent is obtained.  The left operative hip has been marked.  11/25/2021 8:34 AM  Leslie Knapp  has presented today for surgery, with the diagnosis of OSTEOARTHRITIS LEFT HIP.  The various methods of treatment have been discussed with the patient and family. After consideration of risks, benefits and other options for treatment, the patient has consented to  Procedure(s): LEFT TOTAL HIP ARTHROPLASTY ANTERIOR APPROACH (Left) as a surgical intervention.  The patient's history has been reviewed, patient examined, no change in status, stable for surgery.  I have reviewed the patient's chart and labs.  Questions were answered to the patient's satisfaction.     Mcarthur Rossetti

## 2021-11-26 LAB — CBC
HCT: 42.7 % (ref 36.0–46.0)
Hemoglobin: 13.8 g/dL (ref 12.0–15.0)
MCH: 28.8 pg (ref 26.0–34.0)
MCHC: 32.3 g/dL (ref 30.0–36.0)
MCV: 89.1 fL (ref 80.0–100.0)
Platelets: 187 10*3/uL (ref 150–400)
RBC: 4.79 MIL/uL (ref 3.87–5.11)
RDW: 13.2 % (ref 11.5–15.5)
WBC: 11.1 10*3/uL — ABNORMAL HIGH (ref 4.0–10.5)
nRBC: 0 % (ref 0.0–0.2)

## 2021-11-26 LAB — BASIC METABOLIC PANEL
Anion gap: 6 (ref 5–15)
BUN: 10 mg/dL (ref 8–23)
CO2: 26 mmol/L (ref 22–32)
Calcium: 9 mg/dL (ref 8.9–10.3)
Chloride: 110 mmol/L (ref 98–111)
Creatinine, Ser: 0.67 mg/dL (ref 0.44–1.00)
GFR, Estimated: >60 mL/min (ref >60)
Glucose, Bld: 157 mg/dL — ABNORMAL HIGH (ref 70–99)
Potassium: 4.1 mmol/L (ref 3.5–5.1)
Sodium: 142 mmol/L (ref 135–145)

## 2021-11-26 MED ORDER — HYDROCODONE-ACETAMINOPHEN 7.5-325 MG PO TABS: 1.0000 | ORAL_TABLET | Freq: Four times a day (QID) | ORAL | 0 refills | Status: DC | PRN

## 2021-11-26 MED ORDER — METHOCARBAMOL 500 MG PO TABS: 500.0000 mg | ORAL_TABLET | Freq: Four times a day (QID) | ORAL | 1 refills | Status: DC | PRN

## 2021-11-26 MED ORDER — SODIUM CHLORIDE 0.9 % IV BOLUS
1000.0000 mL | Freq: Once | INTRAVENOUS | Status: AC
  Administered 2021-11-26: 1000 mL via INTRAVENOUS

## 2021-11-26 NOTE — Progress Notes (Signed)
Pt has been up with therapy 2 times today with a drop in BP both times.  PA [facility] notified.  Bolus ordered.

## 2021-11-26 NOTE — TOC Transition Note (Signed)
Transition of Care [facility]) - CM/SW Discharge Note  Patient Details  Name: Leslie Knapp MRN: [id number] Date of Birth: [birth year]  Transition of Care [facility]) CM/SW Contact:  Sherie Don, LCSW Phone Number: 11/26/2021, 10:31 AM  Clinical Narrative: Patient is expected to discharge home after working with PT. CSW spoke with patient to confirm discharge plan. Patient will go home with HHPT, which was prearranged through [place]. Patient has a rolling walker, shower chair, BSC, wheelchair, and lift chair at home so there are no DME needs at this time. TOC signing off.  Final next level of care: [place] Barriers to Discharge: No Barriers Identified  Patient Goals and CMS Choice Patient states their goals for this hospitalization and ongoing recovery are:: Discharge home with [place] CMS Medicare.gov Compare Post Acute Care list provided to:: Patient Choice offered to / list presented to : Patient  Discharge Plan and Services          DME Arranged: N/A DME Agency: NA HH Arranged: PT HH Agency: [place] Representative spoke with at [place] in orthopedist's office  Readmission Risk Interventions     View : No data to display.

## 2021-11-26 NOTE — Progress Notes (Signed)
Subjective: 1 Day Post-Op Procedure(s) (LRB): LEFT TOTAL HIP ARTHROPLASTY ANTERIOR APPROACH (Left) Patient reports pain as moderate.  Did have some lightheadedness this am when up.  Vitals stable overall. Hgb/Hct stable.  Objective: Vital signs in last 24 hours: Temp:  [97.5 F (36.4 C)-98.8 F (37.1 C)] 97.7 F (36.5 C) (05/20 1034) Pulse Rate:  [70-97] 84 (05/20 1119) Resp:  [10-20] 18 (05/20 1034) BP: (99-137)/(57-88) 104/57 (05/20 1119) SpO2:  [92 %-100 %] 99 % (05/20 1034)  Intake/Output from previous day: 05/19 0701 - 05/20 0700 In: 3303.1 [P.O.:720; I.V.:2383.1; IV Piggyback:200] Out: 3200 [Urine:2950; Blood:250] Intake/Output this shift: No intake/output data recorded.  Recent Labs    11/26/21 0322  HGB 13.8   Recent Labs    11/26/21 0322  WBC 11.1*  RBC 4.79  HCT 42.7  PLT 187   Recent Labs    11/26/21 0322  NA 142  K 4.1  CL 110  CO2 26  BUN 10  CREATININE 0.67  GLUCOSE 157*  CALCIUM 9.0   No results for input(s): LABPT, INR in the last 72 hours.  Sensation intact distally Intact pulses distally Dorsiflexion/Plantar flexion intact Incision: scant drainage   Assessment/Plan: 1 Day Post-Op Procedure(s) (LRB): LEFT TOTAL HIP ARTHROPLASTY ANTERIOR APPROACH (Left) Up with therapy Will need to keep here today for mobility purposes and since she did have a harder time this am. Discharge tomorrow vs Monday depending on her progress.     Mcarthur Rossetti 11/26/2021, 12:16 PM

## 2021-11-26 NOTE — Progress Notes (Signed)
Physical Therapy Treatment Patient Details Name: Leslie Knapp MRN: [id number] DOB: Feb 20, 1951 Today's Date: 11/26/2021   History of Present Illness Pt is a 71yo female presenting s/p L-THA, AA on 11/25/21. PMH: fibryomylaiga, IBS, OA, post-concussion, b/l TKA 2005, R-THA 2022, RBBB.    PT Comments    POD # 1 am session General Comments: AxO x 3 pleasant.  Can be anxious.  Slightly Nervous, OCD and brought some items from home that help her move (leg straps/bed straps/slide sheets).  Assisted OOB was difficult.  General bed mobility comments: required increased assist from supine to partial sidelying to EOB using bed rail, bed strap and slide sheets to complete.  Also required increased time. General transfer comment: pt using forward momentum and elevated surface.  Also assisted with a BSC transfer. General Gait Details: distance limited by increased c/o feeling "dizzy".  BP after amb 12 feet on now siiting in recliner 90/66.  RN present during session. Positioned in recliner to comfort and applied ICE.  RN in room.     Recommendations for follow up therapy are one component of a multi-disciplinary discharge planning process, led by the attending physician.  Recommendations may be updated based on patient status, additional functional criteria and insurance authorization.  Follow Up Recommendations  Follow physician's recommendations for discharge plan and follow up therapies     Assistance Recommended at Discharge Intermittent Supervision/Assistance  Patient can return home with the following A little help with walking and/or transfers;A little help with bathing/dressing/bathroom;Assistance with cooking/housework;Assist for transportation;Help with stairs or ramp for entrance   Equipment Recommendations  None recommended by PT    Recommendations for Other Services       Precautions / Restrictions Precautions Precautions: Fall Restrictions Weight Bearing Restrictions: No LLE Weight  Bearing: Weight bearing as tolerated     Mobility  Bed Mobility Overal bed mobility: Needs Assistance Bed Mobility: Supine to Sit     Supine to sit: Max assist     General bed mobility comments: required increased assist from supine to partial sidelying to EOB using bed rail, bed strap and slide sheets to complete.  Also required increased time.    Transfers Overall transfer level: Needs assistance Equipment used: Rolling walker (2 wheels) Transfers: Sit to/from Stand Sit to Stand: From elevated surface, Mod assist, +2 physical assistance, +2 safety/equipment           General transfer comment: pt using forward momentum and elevated surface.  Also assisted with a BSC transfer.    Ambulation/Gait Ambulation/Gait assistance: Min guard, +2 physical assistance, +2 safety/equipment Gait Distance (Feet): 12 Feet Assistive device: Rolling walker (2 wheels) Gait Pattern/deviations: Step-to pattern, Antalgic, Decreased stance time - left Gait velocity: decreased     General Gait Details: distance limited by increased c/o feeling "dizzy".  BP after amb 12 feet on now siiting in recliner 90/66.  RN present during session.   Stairs             Wheelchair Mobility    Modified Rankin (Stroke Patients Only)       Balance                                            Cognition Arousal/Alertness: Awake/alert Behavior During Therapy: WFL for tasks assessed/performed Overall Cognitive Status: Within Functional Limits for tasks assessed  General Comments: AxO x 3 pleasant.  Can be anxious.  Slightly OCD and brought some items from home that help her move (leg straps/bed straps/slide sheets)        Exercises      General Comments        Pertinent Vitals/Pain Pain Assessment Pain Assessment: 0-10 Pain Score: 7  Pain Location: L hip Pain Descriptors / Indicators: Operative site guarding, Discomfort,  Crying, Guarding, Grimacing Pain Intervention(s): Monitored during session, Premedicated before session, Repositioned, Ice applied    Home Living                          Prior Function            PT Goals (current goals can now be found in the care plan section) Progress towards PT goals: Progressing toward goals    Frequency    7X/week      PT Plan Current plan remains appropriate    Co-evaluation              AM-PAC PT "6 Clicks" Mobility   Outcome Measure  Help needed turning from your back to your side while in a flat bed without using bedrails?: A Little Help needed moving from lying on your back to sitting on the side of a flat bed without using bedrails?: A Little Help needed moving to and from a bed to a chair (including a wheelchair)?: A Little Help needed standing up from a chair using your arms (e.g., wheelchair or bedside chair)?: A Little Help needed to walk in hospital room?: A Lot Help needed climbing 3-5 steps with a railing? : Total 6 Click Score: 15    End of Session Equipment Utilized During Treatment: Gait belt Activity Tolerance: Other (comment) (hypotensive) Patient left: in chair;with call bell/phone within reach;with chair alarm set Nurse Communication: Mobility status PT Visit Diagnosis: Difficulty in walking, not elsewhere classified (R26.2);Pain Pain - Right/Left: Left Pain - part of body: Hip     Time: 2376-2831 PT Time Calculation (min) (ACUTE ONLY): 33 min  Charges:  $Gait Training: 8-22 mins $Therapeutic Activity: 8-22 mins                     Rica Koyanagi  PTA Acute  Rehabilitation Services Pager      [phone] Office      [phone]

## 2021-11-26 NOTE — Progress Notes (Addendum)
Physical Therapy Treatment Patient Details Name: Leslie Knapp MRN: [id number] DOB: 10-28-1950 Today's Date: 11/26/2021   History of Present Illness Pt is a 71yo female presenting s/p L-THA, AA on 11/25/21. PMH: fibryomylaiga, IBS, OA, post-concussion, b/l TKA 2005, R-THA 2022, RBBB.    PT Comments    POD # 1 pm session Pt only sat in recliner < 45 min due to "discomfort".  Assisted OOB this afternoon was difficult.  General bed mobility comments: required increased assist from supine to partial sidelying to EOB using bed rail, bed strap and slide sheets to complete.  Also required increased time. General transfer comment: pt using forward momentum and elevated surface.  Also assisted with a BSC transfer. General Gait Details: distance limited by increased c/o feeling "dizzy".  BP after amb 6 feet on now siiting in recliner 75/57 standing.  RN present during session. Assisted back to bed and positioned to comfort.  Pt Educated on AP, knee presses.   Pt has NOT met her goals to safely D/C to home today.    Recommendations for follow up therapy are one component of a multi-disciplinary discharge planning process, led by the attending physician.  Recommendations may be updated based on patient status, additional functional criteria and insurance authorization.  Follow Up Recommendations  Follow physician's recommendations for discharge plan and follow up therapies     Assistance Recommended at Discharge Intermittent Supervision/Assistance  Patient can return home with the following A little help with walking and/or transfers;A little help with bathing/dressing/bathroom;Assistance with cooking/housework;Assist for transportation;Help with stairs or ramp for entrance   Equipment Recommendations  None recommended by PT    Recommendations for Other Services       Precautions / Restrictions Precautions Precautions: Fall Restrictions Weight Bearing Restrictions: No LLE Weight Bearing: Weight  bearing as tolerated     Mobility  Bed Mobility Overal bed mobility: Needs Assistance Bed Mobility: Supine to Sit, Sit to Supine     Supine to sit: Max assist Sit to supine: Max assist   General bed mobility comments: required increased assist from supine to partial sidelying to EOB using bed rail, bed strap and slide sheets to complete.  Also required increased time.  + 2 assist back to bed with increased time.    Transfers Overall transfer level: Needs assistance Equipment used: Rolling walker (2 wheels) Transfers: Sit to/from Stand Sit to Stand: From elevated surface, Mod assist, +2 physical assistance, +2 safety/equipment           General transfer comment: pt using forward momentum and elevated surface.  Also assisted with a BSC transfer.    Ambulation/Gait Ambulation/Gait assistance: Min guard, +2 physical assistance, +2 safety/equipment Gait Distance (Feet): 6 Feet Assistive device: Rolling walker (2 wheels) Gait Pattern/deviations: Step-to pattern, Antalgic, Decreased stance time - left Gait velocity: decreased     General Gait Details: distance limited by increased c/o feeling "dizzy".  BP after amb 6 feet on now siiting in recliner 75/57 standing.  RN present during session.   Stairs             Wheelchair Mobility    Modified Rankin (Stroke Patients Only)       Balance                                            Cognition Arousal/Alertness: Awake/alert Behavior During Therapy: WFL for tasks assessed/performed Overall  Cognitive Status: Within Functional Limits for tasks assessed                                 General Comments: AxO x 3 pleasant.  Can be anxious.  Slightly OCD and brought some items from home that help her move (leg straps/bed straps/slide sheets)        Exercises      General Comments        Pertinent Vitals/Pain Pain Assessment Pain Assessment: 0-10 Pain Score: 7  Pain Location: L  hip Pain Descriptors / Indicators: Operative site guarding, Discomfort, Crying, Guarding, Grimacing Pain Intervention(s): Monitored during session, Premedicated before session, Repositioned, Ice applied    Home Living                          Prior Function            PT Goals (current goals can now be found in the care plan section) Progress towards PT goals: Progressing toward goals    Frequency    7X/week      PT Plan Current plan remains appropriate    Co-evaluation              AM-PAC PT "6 Clicks" Mobility   Outcome Measure  Help needed turning from your back to your side while in a flat bed without using bedrails?: A Little Help needed moving from lying on your back to sitting on the side of a flat bed without using bedrails?: A Little Help needed moving to and from a bed to a chair (including a wheelchair)?: A Little Help needed standing up from a chair using your arms (e.g., wheelchair or bedside chair)?: A Little Help needed to walk in hospital room?: A Lot Help needed climbing 3-5 steps with a railing? : Total 6 Click Score: 15    End of Session Equipment Utilized During Treatment: Gait belt Activity Tolerance: Other (comment) (hypotensive) Patient left: in bed Nurse Communication: Mobility status PT Visit Diagnosis: Difficulty in walking, not elsewhere classified (R26.2);Pain Pain - Right/Left: Left Pain - part of body: Hip     Time: 1404-1440 PT Time Calculation (min) (ACUTE ONLY): 36 min  Charges:  $Gait Training: 8-22 mins $Therapeutic Activity: 8-22 mins                     Lori Kropski  PTA Acute  Rehabilitation Services Pager      [phone] Office      [phone]   

## 2021-11-26 NOTE — Discharge Instructions (Addendum)
INSTRUCTIONS AFTER JOINT REPLACEMENT   Remove items at home which could result in a fall. This includes throw rugs or furniture in walking pathways ICE to the affected joint every three hours while awake for 30 minutes at a time, for at least the first 3-5 days, and then as needed for pain and swelling.  Continue to use ice for pain and swelling. You may notice swelling that will progress down to the foot and ankle.  This is normal after surgery.  Elevate your leg when you are not up walking on it.   Continue to use the breathing machine you got in the hospital (incentive spirometer) which will help keep your temperature down.  It is common for your temperature to cycle up and down following surgery, especially at night when you are not up moving around and exerting yourself.  The breathing machine keeps your lungs expanded and your temperature down.   DIET:  As you were doing prior to hospitalization, we recommend a well-balanced diet.  DRESSING / WOUND CARE / SHOWERING  Keep the surgical dressing until follow up.  The dressing is water proof, so you can shower without any extra covering.  IF THE DRESSING FALLS OFF or the wound gets wet inside, change the dressing with sterile gauze.  Please use good hand washing techniques before changing the dressing.  Do not use any lotions or creams on the incision until instructed by your surgeon.    ACTIVITY  Increase activity slowly as tolerated, but follow the weight bearing instructions below.   No driving for 6 weeks or until further direction given by your physician.  You cannot drive while taking narcotics.  No lifting or carrying greater than 10 lbs. until further directed by your surgeon. Avoid periods of inactivity such as sitting longer than an hour when not asleep. This helps prevent blood clots.  You may return to work once you are authorized by your doctor.     WEIGHT BEARING   Weight bearing as tolerated with assist device (walker, cane,  etc) as directed, use it as long as suggested by your surgeon or therapist, typically at least 4-6 weeks.   EXERCISES  Results after joint replacement surgery are often greatly improved when you follow the exercise, range of motion and muscle strengthening exercises prescribed by your doctor. Safety measures are also important to protect the joint from further injury. Any time any of these exercises cause you to have increased pain or swelling, decrease what you are doing until you are comfortable again and then slowly increase them. If you have problems or questions, call your caregiver or physical therapist for advice.   Rehabilitation is important following a joint replacement. After just a few days of immobilization, the muscles of the leg can become weakened and shrink (atrophy).  These exercises are designed to build up the tone and strength of the thigh and leg muscles and to improve motion. Often times heat used for twenty to thirty minutes before working out will loosen up your tissues and help with improving the range of motion but do not use heat for the first two weeks following surgery (sometimes heat can increase post-operative swelling).   These exercises can be done on a training (exercise) mat, on the floor, on a table or on a bed. Use whatever works the best and is most comfortable for you.    Use music or television while you are exercising so that the exercises are a pleasant break in your   day. This will make your life better with the exercises acting as a break in your routine that you can look forward to.   Perform all exercises about fifteen times, three times per day or as directed.  You should exercise both the operative leg and the other leg as well.  Exercises include:   Quad Sets - Tighten up the muscle on the front of the thigh (Quad) and hold for 5-10 seconds.   Straight Leg Raises - With your knee straight (if you were given a brace, keep it on), lift the leg to 60  degrees, hold for 3 seconds, and slowly lower the leg.  Perform this exercise against resistance later as your leg gets stronger.  Leg Slides: Lying on your back, slowly slide your foot toward your buttocks, bending your knee up off the floor (only go as far as is comfortable). Then slowly slide your foot back down until your leg is flat on the floor again.  Angel Wings: Lying on your back spread your legs to the side as far apart as you can without causing discomfort.  Hamstring Strength:  Lying on your back, push your heel against the floor with your leg straight by tightening up the muscles of your buttocks.  Repeat, but this time bend your knee to a comfortable angle, and push your heel against the floor.  You may put a pillow under the heel to make it more comfortable if necessary.   A rehabilitation program following joint replacement surgery can speed recovery and prevent re-injury in the future due to weakened muscles. Contact your doctor or a physical therapist for more information on knee rehabilitation.    CONSTIPATION  Constipation is defined medically as fewer than three stools per week and severe constipation as less than one stool per week.  Even if you have a regular bowel pattern at home, your normal regimen is likely to be disrupted due to multiple reasons following surgery.  Combination of anesthesia, postoperative narcotics, change in appetite and fluid intake all can affect your bowels.   YOU MUST use at least one of the following options; they are listed in order of increasing strength to get the job done.  They are all available over the counter, and you may need to use some, POSSIBLY even all of these options:    Drink plenty of fluids (prune juice may be helpful) and high fiber foods Colace 100 mg by mouth twice a day  Senokot for constipation as directed and as needed Dulcolax (bisacodyl), take with full glass of water  Miralax (polyethylene glycol) once or twice a day as  needed.  If you have tried all these things and are unable to have a bowel movement in the first 3-4 days after surgery call either your surgeon or your primary doctor.    If you experience loose stools or diarrhea, hold the medications until you stool forms back up.  If your symptoms do not get better within 1 week or if they get worse, check with your doctor.  If you experience "the worst abdominal pain ever" or develop nausea or vomiting, please contact the office immediately for further recommendations for treatment.   ITCHING:  If you experience itching with your medications, try taking only a single pain pill, or even half a pain pill at a time.  You can also use Benadryl over the counter for itching or also to help with sleep.   TED HOSE STOCKINGS:  Use stockings on both   legs until for at least 2 weeks or as directed by physician office. They may be removed at night for sleeping.  MEDICATIONS:  See your medication summary on the "After Visit Summary" that nursing will review with you.  You may have some home medications which will be placed on hold until you complete the course of blood thinner medication.  It is important for you to complete the blood thinner medication as prescribed.  PRECAUTIONS:  If you experience chest pain or shortness of breath - call 911 immediately for transfer to the hospital emergency department.   If you develop a fever greater that 101 F, purulent drainage from wound, increased redness or drainage from wound, foul odor from the wound/dressing, or calf pain - CONTACT YOUR SURGEON.                                                   FOLLOW-UP APPOINTMENTS:  If you do not already have a post-op appointment, please call the office for an appointment to be seen by your surgeon.  Guidelines for how soon to be seen are listed in your "After Visit Summary", but are typically between 1-4 weeks after surgery.  OTHER INSTRUCTIONS:   Knee Replacement:  Do not place pillow  under knee, focus on keeping the knee straight while resting. CPM instructions: 0-90 degrees, 2 hours in the morning, 2 hours in the afternoon, and 2 hours in the evening. Place foam block, curve side up under heel at all times except when in CPM or when walking.  DO NOT modify, tear, cut, or change the foam block in any way.  POST-OPERATIVE OPIOID TAPER INSTRUCTIONS: It is important to wean off of your opioid medication as soon as possible. If you do not need pain medication after your surgery it is ok to stop day one. Opioids include: Codeine, Hydrocodone(Norco, Vicodin), Oxycodone(Percocet, oxycontin) and hydromorphone amongst others.  Long term and even short term use of opiods can cause: Increased pain response Dependence Constipation Depression Respiratory depression And more.  Withdrawal symptoms can include Flu like symptoms Nausea, vomiting And more Techniques to manage these symptoms Hydrate well Eat regular healthy meals Stay active Use relaxation techniques(deep breathing, meditating, yoga) Do Not substitute Alcohol to help with tapering If you have been on opioids for less than two weeks and do not have pain than it is ok to stop all together.  Plan to wean off of opioids This plan should start within one week post op of your joint replacement. Maintain the same interval or time between taking each dose and first decrease the dose.  Cut the total daily intake of opioids by one tablet each day Next start to increase the time between doses. The last dose that should be eliminated is the evening dose.   MAKE SURE YOU:  Understand these instructions.  Get help right away if you are not doing well or get worse.    Thank you for letting us be a part of your medical care team.  It is a privilege we respect greatly.  We hope these instructions will help you stay on track for a fast and full recovery!     Dental Antibiotics:  In most cases prophylactic antibiotics for  Dental procdeures after total joint surgery are not necessary.  Exceptions are as follows:  1. History of prior   total joint infection  2. Severely immunocompromised (Organ Transplant, cancer chemotherapy, Rheumatoid biologic meds such as [place])  3. Poorly controlled diabetes (A1C &gt; 8.0, blood glucose over 200)  If you have one of these conditions, contact your surgeon for an antibiotic prescription, prior to your dental procedure.      Information on my medicine - ELIQUIS (apixaban)  Why was Eliquis prescribed for you? Eliquis was prescribed for you to reduce the risk of forming blood clots that can cause a stroke if you have a medical condition called atrial fibrillation (a type of irregular heartbeat) OR to reduce the risk of a blood clots forming after orthopedic surgery.  What do You need to know about Eliquis ? Take your Eliquis TWICE DAILY - one tablet in the morning and one tablet in the evening with or without food.  It would be best to take the doses about the same time each day.  If you have difficulty swallowing the tablet whole please discuss with your pharmacist how to take the medication safely.  Take Eliquis exactly as prescribed by your doctor and DO NOT stop taking Eliquis without talking to the doctor who prescribed the medication.  Stopping may increase your risk of developing a new clot or stroke.  Refill your prescription before you run out.  After discharge, you should have regular check-up appointments with your healthcare provider that is prescribing your Eliquis.  In the future your dose may need to be changed if your kidney function or weight changes by a significant amount or as you get older.  What do you do if you miss a dose? If you miss a dose, take it as soon as you remember on the same day and resume taking twice daily.  Do not take more than one dose of ELIQUIS at the same time.  Important Safety Information A possible side effect of  Eliquis is bleeding. You should call your healthcare provider right away if you experience any of the following: Bleeding from an injury or your nose that does not stop. Unusual colored urine (red or dark brown) or unusual colored stools (red or black). Unusual bruising for unknown reasons. A serious fall or if you hit your head (even if there is no bleeding).  Some medicines may interact with Eliquis and might increase your risk of bleeding or clotting while on Eliquis. To help avoid this, consult your healthcare provider or pharmacist prior to using any new prescription or non-prescription medications, including herbals, vitamins, non-steroidal anti-inflammatory drugs (NSAIDs) and supplements.  This website has more information on Eliquis (apixaban): [email or web address].

## 2021-11-27 DIAGNOSIS — M1612 Unilateral primary osteoarthritis, left hip: Secondary | ICD-10-CM | POA: Diagnosis present

## 2021-11-27 DIAGNOSIS — Z96641 Presence of right artificial hip joint: Secondary | ICD-10-CM | POA: Diagnosis present

## 2021-11-27 DIAGNOSIS — M797 Fibromyalgia: Secondary | ICD-10-CM | POA: Diagnosis present

## 2021-11-27 DIAGNOSIS — Z888 Allergy status to other drugs, medicaments and biological substances status: Secondary | ICD-10-CM | POA: Diagnosis not present

## 2021-11-27 DIAGNOSIS — I951 Orthostatic hypotension: Secondary | ICD-10-CM | POA: Diagnosis not present

## 2021-11-27 DIAGNOSIS — I700 Atherosclerosis of aorta: Secondary | ICD-10-CM | POA: Diagnosis present

## 2021-11-27 DIAGNOSIS — K589 Irritable bowel syndrome without diarrhea: Secondary | ICD-10-CM | POA: Diagnosis present

## 2021-11-27 DIAGNOSIS — Z8616 Personal history of COVID-19: Secondary | ICD-10-CM | POA: Diagnosis not present

## 2021-11-27 DIAGNOSIS — Z95828 Presence of other vascular implants and grafts: Secondary | ICD-10-CM | POA: Diagnosis not present

## 2021-11-27 DIAGNOSIS — E559 Vitamin D deficiency, unspecified: Secondary | ICD-10-CM | POA: Diagnosis present

## 2021-11-27 DIAGNOSIS — E739 Lactose intolerance, unspecified: Secondary | ICD-10-CM | POA: Diagnosis present

## 2021-11-27 DIAGNOSIS — Z86711 Personal history of pulmonary embolism: Secondary | ICD-10-CM | POA: Diagnosis not present

## 2021-11-27 DIAGNOSIS — Z6841 Body Mass Index (BMI) 40.0 and over, adult: Secondary | ICD-10-CM | POA: Diagnosis not present

## 2021-11-27 DIAGNOSIS — Z7901 Long term (current) use of anticoagulants: Secondary | ICD-10-CM | POA: Diagnosis not present

## 2021-11-27 DIAGNOSIS — E7800 Pure hypercholesterolemia, unspecified: Secondary | ICD-10-CM | POA: Diagnosis present

## 2021-11-27 DIAGNOSIS — G43909 Migraine, unspecified, not intractable, without status migrainosus: Secondary | ICD-10-CM | POA: Diagnosis present

## 2021-11-27 LAB — CBC
HCT: 38.4 % (ref 36.0–46.0)
Hemoglobin: 12.5 g/dL (ref 12.0–15.0)
MCH: 29.5 pg (ref 26.0–34.0)
MCHC: 32.6 g/dL (ref 30.0–36.0)
MCV: 90.6 fL (ref 80.0–100.0)
Platelets: 161 10*3/uL (ref 150–400)
RBC: 4.24 MIL/uL (ref 3.87–5.11)
RDW: 13.7 % (ref 11.5–15.5)
WBC: 11 10*3/uL — ABNORMAL HIGH (ref 4.0–10.5)
nRBC: 0 % (ref 0.0–0.2)

## 2021-11-27 NOTE — Plan of Care (Signed)
  Problem: Education: Goal: Knowledge of the prescribed therapeutic regimen will improve Outcome: Progressing   Problem: Activity: Goal: Ability to avoid complications of mobility impairment will improve Outcome: Progressing   Problem: Education: Goal: Knowledge of General Education information will improve Description: Including pain rating scale, medication(s)/side effects and non-pharmacologic comfort measures Outcome: Progressing   

## 2021-11-27 NOTE — Progress Notes (Signed)
  Subjective: Leslie Knapp is a 71 y.o. female s/p left THA.  They are POD 2.  Pt's pain is controlled.  Not taking any pain medication currently..  Pt has fairly ambulated with great difficulty.  Ambulation has been mostly limited by orthostatic hypotension.  She denies any chest pain or shortness of breath.  No abdominal pain.  She has a healthy appetite.  No calf pain.  Objective: Vital signs in last 24 hours: Temp:  [98.9 F (37.2 C)-99.8 F (37.7 C)] 99.5 F (37.5 C) (05/21 0552) Pulse Rate:  [79-92] 92 (05/21 0552) Resp:  [18-20] 18 (05/21 0552) BP: (99-131)/(62-71) 99/71 (05/21 0925) SpO2:  [95 %-97 %] 95 % (05/21 0552)  Intake/Output from previous day: 05/20 0701 - 05/21 0700 In: 2042.5 [P.O.:600; I.V.:754.5; IV Piggyback:688] Out: 1450 [Urine:1450] Intake/Output this shift: Total I/O In: 120 [P.O.:120] Out: -   Exam:  Mild amount of bloody drainage overlying the proximal aspect of the dressing 2+ DP pulse Sensation intact distally in the left foot Able to dorsiflex and plantarflex the left foot No calf tenderness.  Negative Homans' sign.   Labs: Recent Labs    11/26/21 0322  HGB 13.8   Recent Labs    11/26/21 0322  WBC 11.1*  RBC 4.79  HCT 42.7  PLT 187   Recent Labs    11/26/21 0322  NA 142  K 4.1  CL 110  CO2 26  BUN 10  CREATININE 0.67  GLUCOSE 157*  CALCIUM 9.0   No results for input(s): LABPT, INR in the last 72 hours.  Assessment/Plan: Pt is POD 2 s/p left THA.    -Plan to discharge to home in coming days pending patient's pain and PT eval  -WBAT with a walker  -Mostly struggling with orthostatic hypotension.  Holding opioid medication which may be contributing to hypotension.  Checking CBC today but her hemoglobin yesterday was in normal range.  She has a CNA at home that will assist her as well as she has 2 family members that she lives with that we will be able to help her as well.  She is not on any blood pressure medications  currently.     Leslie Knapp 11/27/2021, 12:01 PM

## 2021-11-27 NOTE — Plan of Care (Signed)
Plan of care reviewed and discussed with the patient. 

## 2021-11-27 NOTE — Progress Notes (Addendum)
Physical Therapy Treatment Patient Details Name: Leslie Knapp MRN: [id number] DOB: 1950/11/07 Today's Date: 11/27/2021   History of Present Illness Pt is a 71yo female presenting s/p L-THA, AA on 11/25/21. PMH: fibryomylaiga, IBS, OA, post-concussion, b/l TKA 2005, R-THA 2022, RBBB.    PT Comments    Pt very cooperative but requiring increased time for all tasks and progressing slowly with mobility 2* anxiety level, pain (declining pain meds) and orthostatic BP with attempts at OOB activity .  BP supine119/64; sit 120/79; stand 101/62 and after walking 5' to sit in recliner 87/63 - RN aware.   Recommendations for follow up therapy are one component of a multi-disciplinary discharge planning process, led by the attending physician.  Recommendations may be updated based on patient status, additional functional criteria and insurance authorization.  Follow Up Recommendations  Follow physician's recommendations for discharge plan and follow up therapies     Assistance Recommended at Discharge Intermittent Supervision/Assistance  Patient can return home with the following A little help with walking and/or transfers;A little help with bathing/dressing/bathroom;Assistance with cooking/housework;Assist for transportation;Help with stairs or ramp for entrance   Equipment Recommendations  None recommended by PT    Recommendations for Other Services       Precautions / Restrictions Precautions Precautions: Fall Restrictions Weight Bearing Restrictions: No LLE Weight Bearing: Weight bearing as tolerated     Mobility  Bed Mobility Overal bed mobility: Needs Assistance Bed Mobility: Supine to Sit     Supine to sit: HOB elevated, Mod assist     General bed mobility comments: increased time with use of bed rail and assist for L LE and use of bedpad to complete rotation to EOB sitting    Transfers Overall transfer level: Needs assistance Equipment used: Rolling walker (2  wheels) Transfers: Sit to/from Stand Sit to Stand: From elevated surface, Mod assist, +2 physical assistance, +2 safety/equipment           General transfer comment: pt using forward momentum and elevated surface.    Ambulation/Gait Ambulation/Gait assistance: Min assist, +2 safety/equipment, +2 physical assistance Gait Distance (Feet): 5 Feet Assistive device: Rolling walker (2 wheels) Gait Pattern/deviations: Step-to pattern, Antalgic, Decreased stance time - left Gait velocity: decreased     General Gait Details: distance limited by increased c/o feeling "dizzy".   Stairs             Wheelchair Mobility    Modified Rankin (Stroke Patients Only)       Balance Overall balance assessment: Needs assistance Sitting-balance support: Feet supported, No upper extremity supported Sitting balance-Leahy Scale: Good     Standing balance support: Reliant on assistive device for balance, During functional activity, Bilateral upper extremity supported Standing balance-Leahy Scale: Poor                              Cognition Arousal/Alertness: Awake/alert Behavior During Therapy: WFL for tasks assessed/performed Overall Cognitive Status: Within Functional Limits for tasks assessed                                 General Comments: AxO x 3 pleasant.  Can be anxious.  Slightly OCD and brought some items from home that help her move (leg straps/bed straps/slide sheets)        Exercises Total Joint Exercises Ankle Circles/Pumps: AROM, Both, 20 reps Quad Sets: AROM, Both, 10 reps, Supine Heel Slides:  AAROM, Left, 15 reps Hip ABduction/ADduction: AAROM, Left, 15 reps, Supine    General Comments        Pertinent Vitals/Pain Pain Assessment Pain Assessment: 0-10 Pain Score: 7  Pain Location: L hip Pain Descriptors / Indicators: Operative site guarding, Discomfort, Guarding, Grimacing Pain Intervention(s): Limited activity within patient's  tolerance, Monitored during session (Pt declining pain meds)    Home Living                          Prior Function            PT Goals (current goals can now be found in the care plan section) Acute Rehab PT Goals Patient Stated Goal: To walk with less pain PT Goal Formulation: With patient Time For Goal Achievement: 12/02/21 Potential to Achieve Goals: Good Progress towards PT goals: Progressing toward goals    Frequency    7X/week      PT Plan Current plan remains appropriate    Co-evaluation              AM-PAC PT "6 Clicks" Mobility   Outcome Measure  Help needed turning from your back to your side while in a flat bed without using bedrails?: A Little Help needed moving from lying on your back to sitting on the side of a flat bed without using bedrails?: A Lot Help needed moving to and from a bed to a chair (including a wheelchair)?: A Lot Help needed standing up from a chair using your arms (e.g., wheelchair or bedside chair)?: A Lot Help needed to walk in hospital room?: Total Help needed climbing 3-5 steps with a railing? : Total 6 Click Score: 11    End of Session Equipment Utilized During Treatment: Gait belt Activity Tolerance: Patient tolerated treatment well Patient left: in chair;with call bell/phone within reach Nurse Communication: Mobility status PT Visit Diagnosis: Difficulty in walking, not elsewhere classified (R26.2);Pain Pain - Right/Left: Left Pain - part of body: Hip     Time: 2956-2130 PT Time Calculation (min) (ACUTE ONLY): 46 min  Charges:  $Gait Training: 8-22 mins $Therapeutic Exercise: 8-22 mins $Therapeutic Activity: 8-22 mins                     Debe Coder PT Acute Rehabilitation Services Pager [phone] Office [phone]    [facility] 11/27/2021, 9:46 AM

## 2021-11-27 NOTE — Progress Notes (Signed)
Physical Therapy Treatment Patient Details Name: Leslie Knapp MRN: [id number] DOB: Jun 22, 1951 Today's Date: 11/27/2021   History of Present Illness Pt is a 71yo female presenting s/p L-THA, AA on 11/25/21. PMH: fibryomylaiga, IBS, OA, post-concussion, b/l TKA 2005, R-THA 2022, RBBB.    PT Comments    Pt continues cooperative but requiring increased time for all tasks and with continued reports of increasing dizziness with standing/ambulation.   Recommendations for follow up therapy are one component of a multi-disciplinary discharge planning process, led by the attending physician.  Recommendations may be updated based on patient status, additional functional criteria and insurance authorization.  Follow Up Recommendations  Follow physician's recommendations for discharge plan and follow up therapies     Assistance Recommended at Discharge Intermittent Supervision/Assistance  Patient can return home with the following A little help with walking and/or transfers;A little help with bathing/dressing/bathroom;Assistance with cooking/housework;Assist for transportation;Help with stairs or ramp for entrance   Equipment Recommendations  None recommended by PT    Recommendations for Other Services       Precautions / Restrictions Precautions Precautions: Fall Restrictions Weight Bearing Restrictions: No LLE Weight Bearing: Weight bearing as tolerated     Mobility  Bed Mobility Overal bed mobility: Needs Assistance Bed Mobility: Sit to Supine     Supine to sit: HOB elevated, Mod assist Sit to supine: Mod assist, +2 for physical assistance   General bed mobility comments: increased time with use of bed rail and assist for Bil LE and trunk control    Transfers Overall transfer level: Needs assistance Equipment used: Rolling walker (2 wheels) Transfers: Sit to/from Stand Sit to Stand: From elevated surface, Mod assist, +2 physical assistance, +2 safety/equipment            General transfer comment: Two attempts required to achieve standing.  pt using forward momentum and elevated surface.    Ambulation/Gait Ambulation/Gait assistance: Min assist, +2 safety/equipment, +2 physical assistance Gait Distance (Feet): 5 Feet Assistive device: Rolling walker (2 wheels) Gait Pattern/deviations: Step-to pattern, Antalgic, Decreased stance time - left Gait velocity: decreased     General Gait Details: distance limited by increased c/o feeling "dizzy".   Stairs             Wheelchair Mobility    Modified Rankin (Stroke Patients Only)       Balance Overall balance assessment: Needs assistance Sitting-balance support: Feet supported, No upper extremity supported Sitting balance-Leahy Scale: Good     Standing balance support: Reliant on assistive device for balance, During functional activity, Bilateral upper extremity supported Standing balance-Leahy Scale: Poor                              Cognition Arousal/Alertness: Awake/alert Behavior During Therapy: WFL for tasks assessed/performed Overall Cognitive Status: Within Functional Limits for tasks assessed                                 General Comments: AxO x 3 pleasant.  Can be anxious.  Slightly OCD and brought some items from home that help her move (leg straps/bed straps/slide sheets)        Exercises Total Joint Exercises Ankle Circles/Pumps: AROM, Both, 20 reps Quad Sets: AROM, Both, 10 reps, Supine Heel Slides: AAROM, Left, 15 reps Hip ABduction/ADduction: AAROM, Left, 15 reps, Supine    General Comments        Pertinent  Vitals/Pain Pain Assessment Pain Assessment: 0-10 Pain Score: 7  Pain Location: L hip Pain Descriptors / Indicators: Operative site guarding, Discomfort, Guarding, Grimacing Pain Intervention(s): Limited activity within patient's tolerance, Monitored during session, Premedicated before session, Ice applied    Home Living                           Prior Function            PT Goals (current goals can now be found in the care plan section) Acute Rehab PT Goals Patient Stated Goal: To walk with less pain PT Goal Formulation: With patient Time For Goal Achievement: 12/02/21 Potential to Achieve Goals: Good Progress towards PT goals: Progressing toward goals    Frequency    7X/week      PT Plan Current plan remains appropriate    Co-evaluation              AM-PAC PT "6 Clicks" Mobility   Outcome Measure  Help needed turning from your back to your side while in a flat bed without using bedrails?: A Little Help needed moving from lying on your back to sitting on the side of a flat bed without using bedrails?: A Lot Help needed moving to and from a bed to a chair (including a wheelchair)?: A Lot Help needed standing up from a chair using your arms (e.g., wheelchair or bedside chair)?: A Lot Help needed to walk in hospital room?: Total Help needed climbing 3-5 steps with a railing? : Total 6 Click Score: 11    End of Session Equipment Utilized During Treatment: Gait belt Activity Tolerance: Patient tolerated treatment well Patient left: with call bell/phone within reach;in bed;with bed alarm set;with nursing/sitter in room Nurse Communication: Mobility status PT Visit Diagnosis: Difficulty in walking, not elsewhere classified (R26.2);Pain Pain - Right/Left: Left Pain - part of body: Hip     Time: 1030-1043 PT Time Calculation (min) (ACUTE ONLY): 13 min  Charges:  $Gait Training: 8-22 mins                     [place] Pager [phone] Office [phone]    Leslie Knapp 11/27/2021, 1:46 PM

## 2021-11-28 NOTE — Progress Notes (Signed)
Patient ID: Leslie Knapp, female   DOB: [birth year], 71 y.o.   MRN: [id number] Awake and alert this morning.  Does report a headache.  She has been getting a little orthostatic when she gets up.  She does feel like she will manage well at home.  Her left operative hip is stable.  I did change the dressing.  We will put in for discharge for this afternoon if she is doing well and this can always be held if she is continuing to not mobilize well.  She understands that as well.

## 2021-11-28 NOTE — Progress Notes (Signed)
Physical Therapy Treatment Patient Details Name: Leslie Knapp MRN: [id number] DOB: Nov 05, 1950 Today's Date: 11/28/2021   History of Present Illness Pt is a 71yo female presenting s/p L-THA, AA on 11/25/21. PMH: fibryomylaiga, IBS, OA, post-concussion, b/l TKA 2005, R-THA 2022, RBBB.    PT Comments    POD # 3 pm session Assisted OOB to [facility] without pulling pt forward with safety belt.  Pt slightly nervous but was able to self rise and transfer hands from bed to walker to [facility] and complete 1/4 turn.  Pt requesting to sit on BSC  "a little longer" in hopes of having a BM.  Notified NT.  Addressed all mobility questions, discussed appropriate activity, educated on use of ICE.  Instructed on proper tech car transfer.  Pt ready for D/C to home.   Recommendations for follow up therapy are one component of a multi-disciplinary discharge planning process, led by the attending physician.  Recommendations may be updated based on patient status, additional functional criteria and insurance authorization.  Follow Up Recommendations  Follow physician's recommendations for discharge plan and follow up therapies     Assistance Recommended at Discharge Intermittent Supervision/Assistance  Patient can return home with the following A little help with walking and/or transfers;A little help with bathing/dressing/bathroom;Assistance with cooking/housework;Assist for transportation;Help with stairs or ramp for entrance   Equipment Recommendations  None recommended by PT    Recommendations for Other Services       Precautions / Restrictions Precautions Precautions: Fall Restrictions Weight Bearing Restrictions: No LLE Weight Bearing: Weight bearing as tolerated     Mobility  Bed Mobility Overal bed mobility: Needs Assistance Bed Mobility: Supine to Sit     Supine to sit: Min assist Sit to supine: Mod assist   General bed mobility comments: increased time with use of bed rail and assist for Bil  LE and trunk control.  Uses 2 sliding sheets to complete swival.    Transfers Overall transfer level: Needs assistance Equipment used: Rolling walker (2 wheels) Transfers: Sit to/from Stand Sit to Stand: Min guard           General transfer comment: still from elevated surface with 25% VC's "nose over toes" vs Therapist pulling gait belt at waist forward.  Pt nervouse to try "different" approach but did well.  Assisted from elevated bed to [facility].    Ambulation/Gait Ambulation/Gait assistance: Min guard, Min assist Gait Distance (Feet): 45 Feet Assistive device: Rolling walker (2 wheels) Gait Pattern/deviations: Step-to pattern, Antalgic, Decreased stance time - left Gait velocity: decreased     General Gait Details: tolerated a functional distance with walker and NO DIZZINESS as well as BP maintaining.  Much improved with pain med change from OXY to HYDRO.   Stairs             Wheelchair Mobility    Modified Rankin (Stroke Patients Only)       Balance                                            Cognition Arousal/Alertness: Awake/alert Behavior During Therapy: WFL for tasks assessed/performed Overall Cognitive Status: Within Functional Limits for tasks assessed                                 General Comments: AxO x 3 pleasant.  Can be anxious.  Slightly OCD and brought some items from home that help her move (leg straps/bed straps/slide sheets)        Exercises      General Comments        Pertinent Vitals/Pain Pain Assessment Pain Assessment: 0-10 Pain Score: 5  Faces Pain Scale: Hurts a little bit Pain Location: L hip with activity Pain Descriptors / Indicators: Operative site guarding, Discomfort, Guarding, Grimacing Pain Intervention(s): Monitored during session, Premedicated before session, Repositioned, Ice applied    Home Living                          Prior Function            PT Goals  (current goals can now be found in the care plan section) Progress towards PT goals: Progressing toward goals    Frequency    7X/week      PT Plan Current plan remains appropriate    Co-evaluation              AM-PAC PT "6 Clicks" Mobility   Outcome Measure  Help needed turning from your back to your side while in a flat bed without using bedrails?: A Little Help needed moving from lying on your back to sitting on the side of a flat bed without using bedrails?: A Little Help needed moving to and from a bed to a chair (including a wheelchair)?: A Little Help needed standing up from a chair using your arms (e.g., wheelchair or bedside chair)?: A Little Help needed to walk in hospital room?: A Little Help needed climbing 3-5 steps with a railing? : A Lot 6 Click Score: 17    End of Session Equipment Utilized During Treatment: Gait belt Activity Tolerance: Patient tolerated treatment well Patient left: Other (comment) (BSC with call light in reach) Nurse Communication: Mobility status PT Visit Diagnosis: Difficulty in walking, not elsewhere classified (R26.2);Pain Pain - Right/Left: Left Pain - part of body: Hip     Time: 9628-3662 PT Time Calculation (min) (ACUTE ONLY): 13 min  Charges:   $Therapeutic Activity: 8-22 mins                     Rica Koyanagi  PTA Acute  Rehabilitation Services Pager      [phone] Office      [phone]

## 2021-11-28 NOTE — Discharge Summary (Signed)
Patient ID: Leslie Knapp MRN: [id number] DOB/AGE: 18-May-1951 71 y.o.  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   Concussion 2010, 2000 and 1998   periodiotic migraine and occ dizzines   COVID-19 09/2019   sx covid pneumonia,fever, chills body aches, took monoclonal antibody tx done symptoms resolved after 6 weeks   CTS (carpal tunnel syndrome)    Fatty liver disease, nonalcoholic    In [facility] Study liver biopsy 2010 did not show fatty liver   Fibromyalgia    History of blood transfusion 1975   after knee surgery   IBS (irritable bowel syndrome)    C/D   Localized swelling of both lower legs    go down with propping up of legs   Morbid obesity ([place])    Osteoarthritis    oa   Pneumonia 09/2019   PONV (postoperative nausea and vomiting)    watch neck position needs support or gets n/v   Thyroid nodule    sees ent dr Radene Journey for q year   Uterine polyp     Surgeries: Procedure(s): LEFT TOTAL HIP ARTHROPLASTY ANTERIOR APPROACH on 11/25/2021   Consultants:   Discharged Condition: Improved  Hospital Course: Leslie Knapp is an 71 y.o. female who was admitted 11/25/2021 for operative treatment ofUnilateral primary osteoarthritis, left hip. Patient has severe unremitting pain that affects sleep, daily activities, and work/hobbies. After pre-op clearance the patient was taken to the operating room on 11/25/2021 and underwent  Procedure(s): LEFT TOTAL HIP ARTHROPLASTY ANTERIOR APPROACH.    Patient was given perioperative antibiotics:  Anti-infectives (From admission, onward)    Start     Dose/Rate  Route Frequency Ordered Stop   11/25/21 1600  ceFAZolin (ANCEF) IVPB 2g/100 mL premix        2 g 200 mL/hr over 30 Minutes Intravenous Every 6 hours 11/25/21 1345 11/25/21 2151   11/25/21 0700  ceFAZolin (ANCEF) IVPB 3g/100 mL premix        3 g 200 mL/hr over 30 Minutes Intravenous On call to O.R. 11/25/21 9147 11/25/21 0955   11/25/21 0700  ceFAZolin (ANCEF) 3-0.9 GM/100ML-% IVPB       Note to Pharmacy: Alphonsus Sias: cabinet override      11/25/21 0700 11/25/21 1009        Patient was given sequential compression devices, early ambulation, and chemoprophylaxis to prevent DVT.  Patient benefited maximally from hospital stay and there were no complications.    Recent vital signs: Patient Vitals for the past 24 hrs:  BP Temp Temp src Pulse Resp SpO2  11/28/21 1506 (!) 144/69 98.8 F (37.1 C) Oral 80 16 100 %  11/28/21 0456 118/72 99.3 F (37.4 C) Oral 88 17 95 %     Recent laboratory studies:  Recent Labs    11/26/21 0322 11/27/21 1206  WBC 11.1* 11.0*  HGB 13.8 12.5  HCT 42.7 38.4  PLT 187 161  NA 142  --   K 4.1  --   CL 110  --   CO2 26  --   BUN 10  --   CREATININE 0.67  --   GLUCOSE 157*  --  CALCIUM 9.0  --      Discharge Medications:   Allergies as of 11/28/2021       Reactions   Celebrex [celecoxib] Swelling, Rash   Lactose Intolerance (gi) Other (See Comments)        Medication List     TAKE these medications    apixaban 2.5 MG Tabs tablet Commonly known as: ELIQUIS Take 1 tablet (2.5 mg total) by mouth 2 (two) times daily.   Cholecalciferol 25 MCG/0.03ML Liqd Take 1,000 Units by mouth daily.   Enzyme Digest Caps Take 1 capsule by mouth daily as needed (heartburn).   HYDROcodone-acetaminophen 7.5-325 MG tablet Commonly known as: Norco Take 1 tablet by mouth every 6 (six) hours as needed for moderate pain.   ibuprofen 200 MG tablet Commonly known as: ADVIL Take 400 mg by mouth at bedtime as needed.   methocarbamol 500 MG  tablet Commonly known as: ROBAXIN Take 1 tablet (500 mg total) by mouth every 6 (six) hours as needed for muscle spasms.   MINERALS PO Take 1 capsule by mouth daily.               Durable Medical Equipment  (From admission, onward)           Start     Ordered   11/25/21 1346  DME 3 n 1  Once        11/25/21 1345   11/25/21 1346  DME Walker rolling  Once       Question Answer Comment  Walker: With 5 Inch Wheels   Patient needs a walker to treat with the following condition Status post left hip replacement      11/25/21 1345            Diagnostic Studies: DG Pelvis Portable  Result Date: 11/25/2021 CLINICAL DATA:  Left hip replacement EXAM: PORTABLE PELVIS 1-2 VIEWS COMPARISON:  06/17/2021 FINDINGS: Status post interval left total hip arthroplasty. The arthroplasty components are in near anatomic alignment. Redemonstrated right total hip arthroplasty. No acute fracture. Air within the left hip soft tissues is not unexpected postoperatively. IMPRESSION: Expected postoperative appearance status post left total hip arthroplasty. Electronically Signed   By: Merilyn Baba M.D.   On: 11/25/2021 13:01   DG C-Arm 1-60 Min-No Report  Result Date: 11/25/2021 Fluoroscopy was utilized by the requesting physician.  No radiographic interpretation.   MM 3D SCREEN BREAST BILATERAL  Result Date: 11/23/2021 CLINICAL DATA:  Screening. EXAM: DIGITAL SCREENING BILATERAL MAMMOGRAM WITH TOMOSYNTHESIS AND CAD TECHNIQUE: Bilateral screening digital craniocaudal and mediolateral oblique mammograms were obtained. Bilateral screening digital breast tomosynthesis was performed. The images were evaluated with computer-aided detection. COMPARISON:  Previous exam(s). ACR Breast Density Category a: The breast tissue is almost entirely fatty. FINDINGS: There are no findings suspicious for malignancy. IMPRESSION: No mammographic evidence of malignancy. A result letter of this screening mammogram will be  mailed directly to the patient. RECOMMENDATION: Screening mammogram in one year. (Code:[id number]) BI-RADS CATEGORY  1: Negative. Electronically Signed   By: Franki Cabot M.D.   On: 11/23/2021 13:04   DG HIP UNILAT WITH PELVIS 1V LEFT  Result Date: 11/25/2021 CLINICAL DATA:  Left anterior hip replacement EXAM: DG HIP (WITH OR WITHOUT PELVIS) 1V*L* COMPARISON:  None Available. FINDINGS: Multiple intraoperative fluoroscopic spot images are provided. Interval left total hip arthroplasty. Normal alignment. Suggestion of a left greater trochanteric fracture. FLUOROSCOPY TIME:  Radiation Exposure Index: 4.68 mGy IMPRESSION: 1. Interval left total hip arthroplasty. Electronically Signed   By:  Kathreen Devoid M.D.   On: 11/25/2021 11:15    Disposition: Discharge disposition: 01-Home or [place]     Mcarthur Rossetti, MD Follow up in 2 week(s).   Specialty: Orthopedic Surgery Contact information: [place] [address] [place], [place] Follow up.   Specialty: Home Health Services Why: [facility] will provide PT in the home. Contact information: [address] [place] [address] [phone]                  Signed: Mcarthur Rossetti 11/28/2021, 10:05 PM

## 2021-11-28 NOTE — Progress Notes (Signed)
Physical Therapy Treatment Patient Details Name: Leslie Knapp MRN: [id number] DOB: 11-24-1950 Today's Date: 11/28/2021   History of Present Illness Pt is a 71yo female presenting s/p L-THA, AA on 11/25/21. PMH: fibryomylaiga, IBS, OA, post-concussion, b/l TKA 2005, R-THA 2022, RBBB.    PT Comments    POD # 3 am session MUCH improved with pain med change from OXY to HYDRO.  Pt amb a functional distance 45 feet with NO DIZZINESS and BP's elevating vs dropping.   Pt plans to hire a paersonal CNA "for a month" and will have a sister help "some" to.   Pt hopes to D/C to home today.  She has a wheelchair she plans to use to get into house.  She also has a RAMP.     Recommendations for follow up therapy are one component of a multi-disciplinary discharge planning process, led by the attending physician.  Recommendations may be updated based on patient status, additional functional criteria and insurance authorization.  Follow Up Recommendations  Follow physician's recommendations for discharge plan and follow up therapies     Assistance Recommended at Discharge Intermittent Supervision/Assistance  Patient can return home with the following A little help with walking and/or transfers;A little help with bathing/dressing/bathroom;Assistance with cooking/housework;Assist for transportation;Help with stairs or ramp for entrance   Equipment Recommendations  None recommended by PT    Recommendations for Other Services       Precautions / Restrictions Precautions Precautions: Fall Restrictions Weight Bearing Restrictions: No LLE Weight Bearing: Weight bearing as tolerated     Mobility  Bed Mobility Overal bed mobility: Needs Assistance Bed Mobility: Supine to Sit     Supine to sit: Min assist, Mod assist Sit to supine: Mod assist   General bed mobility comments: increased time with use of bed rail and assist for Bil LE and trunk control.  Uses 2 sliding sheets to complete swival.   Still needs assist to support L LE up onto bed.    Transfers Overall transfer level: Needs assistance Equipment used: Rolling walker (2 wheels) Transfers: Sit to/from Stand Sit to Stand: Min guard           General transfer comment: still from elevated surface with 75% VC's "nose over toes" vs Therapist pulling gait belt at waist forward.  Pt nervouse to try "different" approach but did well.    Ambulation/Gait Ambulation/Gait assistance: Min guard, Min assist Gait Distance (Feet): 45 Feet Assistive device: Rolling walker (2 wheels) Gait Pattern/deviations: Step-to pattern, Antalgic, Decreased stance time - left Gait velocity: decreased     General Gait Details: tolerated a functional distance with walker and NO DIZZINESS as well as BP maintaining.  Much improved with pain med change from OXY to HYDRO.   Stairs             Wheelchair Mobility    Modified Rankin (Stroke Patients Only)       Balance                                            Cognition Arousal/Alertness: Awake/alert Behavior During Therapy: WFL for tasks assessed/performed Overall Cognitive Status: Within Functional Limits for tasks assessed                                 General Comments: AxO x 3 pleasant.  Can be anxious.  Slightly OCD and brought some items from home that help her move (leg straps/bed straps/slide sheets)        Exercises  10 reps AP   20 reps HS using leg strap    General Comments        Pertinent Vitals/Pain Pain Assessment Pain Assessment: Faces Faces Pain Scale: Hurts a little bit Pain Location: L hip with activity Pain Descriptors / Indicators: Operative site guarding, Discomfort, Guarding, Grimacing Pain Intervention(s): Monitored during session, Repositioned, Ice applied    Home Living                          Prior Function            PT Goals (current goals can now be found in the care plan section)  Progress towards PT goals: Progressing toward goals    Frequency    7X/week      PT Plan Current plan remains appropriate    Co-evaluation              AM-PAC PT "6 Clicks" Mobility   Outcome Measure  Help needed turning from your back to your side while in a flat bed without using bedrails?: A Little Help needed moving from lying on your back to sitting on the side of a flat bed without using bedrails?: A Little Help needed moving to and from a bed to a chair (including a wheelchair)?: A Little Help needed standing up from a chair using your arms (e.g., wheelchair or bedside chair)?: A Little Help needed to walk in hospital room?: A Lot Help needed climbing 3-5 steps with a railing? : Total 6 Click Score: 15    End of Session Equipment Utilized During Treatment: Gait belt Activity Tolerance: Patient tolerated treatment well Patient left: with call bell/phone within reach;in bed;with bed alarm set;with nursing/sitter in room Nurse Communication: Mobility status PT Visit Diagnosis: Difficulty in walking, not elsewhere classified (R26.2);Pain Pain - Right/Left: Left Pain - part of body: Hip     Time: 6948-5462 PT Time Calculation (min) (ACUTE ONLY): 28 min  Charges:  $Gait Training: 8-22 mins $Therapeutic Activity: 8-22 mins                     Rica Koyanagi  PTA Acute  Rehabilitation Services Pager      [phone] Office      [phone]

## 2021-11-29 ENCOUNTER — Encounter (HOSPITAL_COMMUNITY): Payer: Self-pay | Admitting: Orthopaedic Surgery

## 2021-12-08 ENCOUNTER — Encounter: Payer: Self-pay | Admitting: Orthopaedic Surgery

## 2021-12-08 ENCOUNTER — Ambulatory Visit (INDEPENDENT_AMBULATORY_CARE_PROVIDER_SITE_OTHER): Payer: Medicare Other | Admitting: Orthopaedic Surgery

## 2021-12-08 ENCOUNTER — Telehealth: Payer: Self-pay | Admitting: Orthopaedic Surgery

## 2021-12-08 DIAGNOSIS — Z96642 Presence of left artificial hip joint: Secondary | ICD-10-CM

## 2021-12-08 NOTE — Telephone Encounter (Signed)
Leslie Knapp with [facility] called and states that she had this pt sch for 3 times this week and she only did one.   CB [phone]

## 2021-12-08 NOTE — Progress Notes (Signed)
The patient is 2 weeks status post a left total hip arthroplasty.  She is reporting good range of motion and strength and is making progress.  Having had a right total hip arthroplasty done in the last year, she feels like she can really do the therapy on her own and I agree with this as well given her motivation.  She is on chronic Eliquis.  She is only taken ibuprofen as an anti-inflammatory and icing her hip.  She is someone who has a larger BMI so we want to leave the staples in for at least 1 more week.  Overall though she does look good.  We will see her back next week for staple removal.  I did place a new Aquacel dressing and some Xeroform over her incision.  I did not see any worrisome features thus far.

## 2021-12-13 ENCOUNTER — Telehealth: Payer: Self-pay | Admitting: Orthopaedic Surgery

## 2021-12-13 NOTE — Telephone Encounter (Signed)
Patient nurse called and said she has cancelled a few in him appts last week and some this week and the reason is being she is stating her back has been hurting. Nurse just wanted to call and notate what was going on.

## 2021-12-15 ENCOUNTER — Encounter: Payer: Self-pay | Admitting: Orthopaedic Surgery

## 2021-12-15 ENCOUNTER — Encounter: Payer: Medicare Other | Admitting: Orthopaedic Surgery

## 2021-12-15 ENCOUNTER — Telehealth: Payer: Self-pay | Admitting: Orthopaedic Surgery

## 2021-12-15 NOTE — Telephone Encounter (Signed)
Responded via pt. portal

## 2021-12-15 NOTE — Telephone Encounter (Signed)
Pt is calling she has to cxl her appt for today --as she has twisted her back --  Can a home health nurse come out to remove stitches --please call

## 2021-12-15 NOTE — Telephone Encounter (Signed)
Left message for center well to cb to get this set up

## 2021-12-15 NOTE — Telephone Encounter (Signed)
Please advise 

## 2021-12-16 ENCOUNTER — Telehealth: Payer: Self-pay

## 2021-12-16 NOTE — Telephone Encounter (Signed)
Anderson Malta, PT with [facility] called stating that patient is in severe pain, 10/10 with left hip and right lower back pain.  Patient is only taken Ibuprofen, '200mg'$  and would like to know if a Rx for pain can be sent to the pharmacy to help maintain the pain.  Cb# [phone].  Please advise.  Thank you.

## 2021-12-19 ENCOUNTER — Other Ambulatory Visit: Payer: Self-pay | Admitting: Orthopaedic Surgery

## 2021-12-19 MED ORDER — HYDROCODONE-ACETAMINOPHEN 7.5-325 MG PO TABS: 1.0000 | ORAL_TABLET | Freq: Four times a day (QID) | ORAL | 0 refills | Status: DC | PRN

## 2021-12-19 NOTE — Telephone Encounter (Signed)
Pt portal message sent to pt. Informing

## 2021-12-23 ENCOUNTER — Telehealth: Payer: Self-pay | Admitting: Orthopaedic Surgery

## 2021-12-23 NOTE — Telephone Encounter (Signed)
Pt is being discharged as she states she doesn't need it any longer

## 2021-12-23 NOTE — Telephone Encounter (Signed)
FYI

## 2021-12-28 ENCOUNTER — Ambulatory Visit: Payer: Self-pay

## 2021-12-28 DIAGNOSIS — M1611 Unilateral primary osteoarthritis, right hip: Secondary | ICD-10-CM

## 2021-12-28 DIAGNOSIS — E7800 Pure hypercholesterolemia, unspecified: Secondary | ICD-10-CM

## 2021-12-28 NOTE — Chronic Care Management (AMB) (Signed)
  Care Management   Follow Up Note   12/28/2021 Name: Leslie Knapp MRN: [id number] DOB: [birth year]   Referred by: Eunice Blase, MD Reason for referral : Chronic Care Management (Case closed no eligible PCP)   Case closed no eligible PCP  Follow Up Plan: No further follow up required: Case closed no eligible PCP Peter Garter RN, BSN,CCM, CDE Care Management Coordinator [facility] [phone]

## 2021-12-28 NOTE — Patient Instructions (Signed)
Visit Information  Case closed no eligible PCP  Peter Garter RN, BSN,CCM, CDE Care Management Coordinator [facility] [phone]

## 2022-01-16 ENCOUNTER — Ambulatory Visit (INDEPENDENT_AMBULATORY_CARE_PROVIDER_SITE_OTHER): Payer: Medicare Other | Admitting: Orthopaedic Surgery

## 2022-01-16 ENCOUNTER — Encounter: Payer: Self-pay | Admitting: Orthopaedic Surgery

## 2022-01-16 DIAGNOSIS — Z96641 Presence of right artificial hip joint: Secondary | ICD-10-CM

## 2022-01-16 DIAGNOSIS — Z96642 Presence of left artificial hip joint: Secondary | ICD-10-CM

## 2022-01-16 MED ORDER — DOXYCYCLINE HYCLATE 100 MG PO TABS: 100.0000 mg | ORAL_TABLET | Freq: Two times a day (BID) | ORAL | 0 refills | Status: DC

## 2022-01-16 NOTE — Progress Notes (Signed)
The patient is a 71 year old female who is getting close to 8 weeks out from a left total hip arthroplasty.  We replaced her right hip within the last year or so.  She has progressed slowly with this hip but mainly due to significant lumbar spine issues.  She does report improved range of motion and strength.  She did state that she was not happy about the length and direction of the incision on her left hip comparing her left and right hips.  Wound closed her right hip with sutures but her left hip with staples.  She has developed some breakdown.  I did have to give her reassurance that I have seen this type of breakdown before and thus far it does not look worrisome to me.  This can be treated with Bactroban ointment once a day after she showers and I will put her prophylactically on some doxycycline.  I did let her know that soft tissue is my biggest concern on anyone with a higher BMI.  However, I do feel that this is going to heal appropriately with time.  I would like to see her back in 2 weeks for additional wound check.  All questions and concerns were answered and addressed.

## 2022-01-30 ENCOUNTER — Encounter: Payer: Self-pay | Admitting: Orthopaedic Surgery

## 2022-01-30 ENCOUNTER — Ambulatory Visit (INDEPENDENT_AMBULATORY_CARE_PROVIDER_SITE_OTHER): Payer: Medicare Other | Admitting: Orthopaedic Surgery

## 2022-01-30 DIAGNOSIS — Z96642 Presence of left artificial hip joint: Secondary | ICD-10-CM

## 2022-01-30 DIAGNOSIS — Z96641 Presence of right artificial hip joint: Secondary | ICD-10-CM

## 2022-01-30 NOTE — Progress Notes (Signed)
The patient is now about 9 to 10 weeks status post a left total hip arthroplasty.  We have been watching the wound to make sure it is healing.  Leslie Knapp is doing well and slowly making progress.  The wound in her groin crease is healed over completely.  There is a wound on the mid area of the hip incision that still needs to heal but there is no evidence of infection at all.  There is no redness no purulent drainage.  I did clean up a little bit with some scissors to get some bleeding tissue.  Leslie Knapp will still clean this wound daily and place Bactroban ointment on it daily.  I would like to see her back in a month to see how Leslie Knapp is doing overall.  At that visit I would like an AP pelvis even if it is supine because of her difficulty with standing.

## 2022-02-27 ENCOUNTER — Encounter: Payer: Self-pay | Admitting: Orthopaedic Surgery

## 2022-02-27 ENCOUNTER — Ambulatory Visit (INDEPENDENT_AMBULATORY_CARE_PROVIDER_SITE_OTHER): Payer: Medicare Other

## 2022-02-27 ENCOUNTER — Ambulatory Visit (INDEPENDENT_AMBULATORY_CARE_PROVIDER_SITE_OTHER): Payer: Medicare Other | Admitting: Orthopaedic Surgery

## 2022-02-27 DIAGNOSIS — Z96642 Presence of left artificial hip joint: Secondary | ICD-10-CM

## 2022-02-27 DIAGNOSIS — Z96641 Presence of right artificial hip joint: Secondary | ICD-10-CM | POA: Diagnosis not present

## 2022-02-27 DIAGNOSIS — I70208 Unspecified atherosclerosis of native arteries of extremities, other extremity: Secondary | ICD-10-CM | POA: Diagnosis not present

## 2022-02-27 NOTE — Progress Notes (Signed)
Leslie Knapp is following up status post a left total hip arthroplasty in May of this year.  We replaced her right hip in December I believe last year.  We have been watching her close due to a small wound on her left hip.  She says that is been healing up nicely.  She has been seeing a chiropractor for her back.  She reports improved range of motion and strength of her left hip.  An AP pelvis shows bilateral total hip arthroplasties with no complicating features.  Her left hip has no pain with internal and external rotation.  There is no cellulitis and the small wound is scabbed over and is almost healed completely at the mid incision area.  She can get in a pool from my standpoint for aerobics and water therapy.  I will see her back in 3 months to see how she is doing overall but no x-rays are needed.  If there are issues before then she will let us know.

## 2022-03-21 ENCOUNTER — Ambulatory Visit: Payer: Medicare Other | Admitting: Family

## 2022-03-21 ENCOUNTER — Other Ambulatory Visit: Payer: Medicare Other

## 2022-04-04 ENCOUNTER — Ambulatory Visit: Payer: Medicare Other | Admitting: Family

## 2022-04-04 ENCOUNTER — Other Ambulatory Visit: Payer: Medicare Other

## 2022-04-24 ENCOUNTER — Inpatient Hospital Stay (HOSPITAL_BASED_OUTPATIENT_CLINIC_OR_DEPARTMENT_OTHER): Payer: Medicare Other | Admitting: Family

## 2022-04-24 ENCOUNTER — Other Ambulatory Visit: Payer: Self-pay

## 2022-04-24 ENCOUNTER — Inpatient Hospital Stay: Payer: Medicare Other | Attending: Hematology & Oncology

## 2022-04-24 ENCOUNTER — Encounter: Payer: Self-pay | Admitting: Family

## 2022-04-24 VITALS — BP 118/95 | HR 81 | Temp 97.7°F | Resp 18 | Ht 69.0 in | Wt 309.8 lb

## 2022-04-24 DIAGNOSIS — D751 Secondary polycythemia: Secondary | ICD-10-CM | POA: Diagnosis not present

## 2022-04-24 DIAGNOSIS — I2699 Other pulmonary embolism without acute cor pulmonale: Secondary | ICD-10-CM | POA: Diagnosis not present

## 2022-04-24 DIAGNOSIS — Z886 Allergy status to analgesic agent status: Secondary | ICD-10-CM | POA: Insufficient documentation

## 2022-04-24 DIAGNOSIS — Z79899 Other long term (current) drug therapy: Secondary | ICD-10-CM | POA: Diagnosis not present

## 2022-04-24 DIAGNOSIS — I742 Embolism and thrombosis of arteries of the upper extremities: Secondary | ICD-10-CM | POA: Diagnosis present

## 2022-04-24 DIAGNOSIS — D6859 Other primary thrombophilia: Secondary | ICD-10-CM

## 2022-04-24 DIAGNOSIS — Z7901 Long term (current) use of anticoagulants: Secondary | ICD-10-CM | POA: Diagnosis not present

## 2022-04-24 DIAGNOSIS — I70208 Unspecified atherosclerosis of native arteries of extremities, other extremity: Secondary | ICD-10-CM

## 2022-04-24 LAB — CBC WITH DIFFERENTIAL (CANCER CENTER ONLY)
Abs Immature Granulocytes: 0.01 10*3/uL (ref 0.00–0.07)
Basophils Absolute: 0 10*3/uL (ref 0.0–0.1)
Basophils Relative: 1 %
Eosinophils Absolute: 0.2 10*3/uL (ref 0.0–0.5)
Eosinophils Relative: 4 %
HCT: 46.9 % — ABNORMAL HIGH (ref 36.0–46.0)
Hemoglobin: 15.5 g/dL — ABNORMAL HIGH (ref 12.0–15.0)
Immature Granulocytes: 0 %
Lymphocytes Relative: 37 %
Lymphs Abs: 2.4 10*3/uL (ref 0.7–4.0)
MCH: 29.4 pg (ref 26.0–34.0)
MCHC: 33 g/dL (ref 30.0–36.0)
MCV: 88.8 fL (ref 80.0–100.0)
Monocytes Absolute: 0.5 10*3/uL (ref 0.1–1.0)
Monocytes Relative: 8 %
Neutro Abs: 3.3 10*3/uL (ref 1.7–7.7)
Neutrophils Relative %: 50 %
Platelet Count: 224 10*3/uL (ref 150–400)
RBC: 5.28 MIL/uL — ABNORMAL HIGH (ref 3.87–5.11)
RDW: 13.2 % (ref 11.5–15.5)
WBC Count: 6.4 10*3/uL (ref 4.0–10.5)
nRBC: 0 % (ref 0.0–0.2)

## 2022-04-24 LAB — CMP (CANCER CENTER ONLY)
ALT: 10 U/L (ref 0–44)
AST: 17 U/L (ref 15–41)
Albumin: 4.2 g/dL (ref 3.5–5.0)
Alkaline Phosphatase: 118 U/L (ref 38–126)
Anion gap: 7 (ref 5–15)
BUN: 18 mg/dL (ref 8–23)
CO2: 28 mmol/L (ref 22–32)
Calcium: 10 mg/dL (ref 8.9–10.3)
Chloride: 105 mmol/L (ref 98–111)
Creatinine: 0.75 mg/dL (ref 0.44–1.00)
GFR, Estimated: >60 mL/min (ref >60)
Glucose, Bld: 109 mg/dL — ABNORMAL HIGH (ref 70–99)
Potassium: 4.5 mmol/L (ref 3.5–5.1)
Sodium: 140 mmol/L (ref 135–145)
Total Bilirubin: 0.5 mg/dL (ref 0.3–1.2)
Total Protein: 7 g/dL (ref 6.5–8.1)

## 2022-04-24 NOTE — Progress Notes (Signed)
Hematology and Oncology Follow Up Visit  Leslie Knapp [id number] 06/05/1951 71 y.o. 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 She is doing well and has finally healed form her last hip surgery in May. [facility] and is excited! She has not noted any blood loss. No bruising or petechiae.  No fever, chills, n/v, cough, rash, dizziness, SOB, chest pain, palpitations, abdominal pain or changes in bowel or bladder habits.  No swelling, numbness or tingling in her extremities at this time. Pedal pulses are 2+.  No falls or syncope reported. She ambulates with a Rolator at this time for added support.  Appetite and hydration have been good. Her weight is 309 lbs.   ECOG Performance Status: 1 - Symptomatic but completely ambulatory  Medications:  Allergies as of 04/24/2022       Reactions   Celebrex [celecoxib] Swelling, Rash   Lactose Intolerance (gi) Other (See Comments)        Medication List        Accurate as of April 24, 2022  9:09 AM. If you have any questions, ask your nurse or doctor.          apixaban 2.5 MG Tabs tablet Commonly known as: ELIQUIS Take 1 tablet (2.5 mg total) by mouth 2 (two) times daily.   Cholecalciferol 25 MCG/0.03ML Liqd Take 1,000 Units by mouth daily.   doxycycline 100 MG tablet Commonly known as: VIBRA-TABS Take 1 tablet (100 mg total) by mouth 2 (two) times daily.   Enzyme Digest Caps Take 1 capsule by mouth daily as needed (heartburn).   HYDROcodone-acetaminophen 7.5-325 MG tablet Commonly known as: Norco Take 1 tablet by mouth every 6 (six) hours as needed for moderate pain.   ibuprofen 200 MG tablet Commonly known  as: ADVIL Take 400 mg by mouth at bedtime as needed.   methocarbamol 500 MG tablet Commonly known as: ROBAXIN Take 1 tablet (500 mg total) by mouth every 6 (six) hours as needed for muscle spasms.   MINERALS PO Take 1 capsule by mouth daily.        Allergies:  Allergies  Allergen Reactions   Celebrex [Celecoxib] Swelling and Rash   Lactose Intolerance (Gi) Other (See Comments)    Past Medical History, Surgical history, Social history, and Family History were reviewed and updated.  Review of Systems: All other 10 point review of systems is negative.   Physical Exam:  vitals were not taken for this visit.   Wt Readings from Last 3 Encounters:  11/27/21 292 lb 1.8 oz (132.5 kg)  11/22/21 293 lb 12.8 oz (133.3 kg)  11/17/21 292 lb (132.5 kg)    Ocular: Sclerae unicteric, pupils equal, round and reactive to light Ear-nose-throat: Oropharynx clear, dentition fair Lymphatic: No cervical or supraclavicular adenopathy Lungs no rales or rhonchi, good excursion bilaterally Heart regular rate and rhythm, no murmur appreciated Abd soft, nontender, positive bowel sounds MSK no focal spinal tenderness, no joint edema Neuro: non-focal, well-oriented, appropriate affect Breasts: Deferred   Lab Results  Component Value Date   WBC 6.4 04/24/2022   HGB 15.5 (H) 04/24/2022   HCT 46.9 (H) 04/24/2022   MCV 88.8 04/24/2022  PLT 224 04/24/2022   Lab Results  Component Value Date   FERRITIN 129 08/23/2020   IRON 63 08/23/2020   TIBC 383 08/23/2020   UIBC 319 08/23/2020   IRONPCTSAT 16 (L) 08/23/2020   Lab Results  Component Value Date   RBC 5.28 (H) 04/24/2022   No results found for: "KPAFRELGTCHN", "LAMBDASER", "KAPLAMBRATIO" No results found for: "IGGSERUM", "IGA", "IGMSERUM" No results found for: "TOTALPROTELP", "ALBUMINELP", "A1GS", "A2GS", "BETS", "BETA2SER", "GAMS", "MSPIKE", "SPEI"   Chemistry      Component Value Date/Time   NA 140 04/24/2022 0814   NA 140  09/24/2019 1934   K 4.5 04/24/2022 0814   CL 105 04/24/2022 0814   CO2 28 04/24/2022 0814   BUN 18 04/24/2022 0814   BUN 15 09/24/2019 1934   CREATININE 0.75 04/24/2022 0814   GLU 87 05/21/2018 0000      Component Value Date/Time   CALCIUM 10.0 04/24/2022 0814   ALKPHOS 118 04/24/2022 0814   AST 17 04/24/2022 0814   ALT 10 04/24/2022 0814   BILITOT 0.5 04/24/2022 0814       Impression and Plan: Leslie Knapp is a very pleasant 71 yo caucasian female with history of bilateral saddle pulmonary emboli with right heart strain as well as a right brachial artery thrombus. She was noted to have a protein C and protein S deficiency.  She continues to tolerate Eliquis maintenance nicely without any issues.  We gave her the forms to apply for financial assistance for Eliquis. Hopefully we can get the cost down for her.  Follow-up in 6 months.   Lottie Dawson, NP 10/16/20239:09 AM

## 2022-05-18 ENCOUNTER — Other Ambulatory Visit: Payer: Self-pay | Admitting: Family Medicine

## 2022-05-18 DIAGNOSIS — R221 Localized swelling, mass and lump, neck: Secondary | ICD-10-CM

## 2022-05-19 ENCOUNTER — Ambulatory Visit
Admission: RE | Admit: 2022-05-19 | Discharge: 2022-05-19 | Disposition: A | Discharge status: Home / Self Care | Payer: Medicare Other | Source: Ambulatory Visit | Attending: Family Medicine | Admitting: Family Medicine

## 2022-05-19 DIAGNOSIS — R221 Localized swelling, mass and lump, neck: Secondary | ICD-10-CM

## 2022-05-19 MED ORDER — IOPAMIDOL (ISOVUE-300) INJECTION 61%
75.0000 mL | Freq: Once | INTRAVENOUS | Status: AC | PRN
  Administered 2022-05-19: 75 mL via INTRAVENOUS

## 2022-05-24 ENCOUNTER — Encounter: Payer: Self-pay | Admitting: Orthopaedic Surgery

## 2022-05-24 ENCOUNTER — Ambulatory Visit (INDEPENDENT_AMBULATORY_CARE_PROVIDER_SITE_OTHER): Payer: Medicare Other | Admitting: Orthopaedic Surgery

## 2022-05-24 DIAGNOSIS — Z96642 Presence of left artificial hip joint: Secondary | ICD-10-CM | POA: Diagnosis not present

## 2022-05-24 DIAGNOSIS — I70208 Unspecified atherosclerosis of native arteries of extremities, other extremity: Secondary | ICD-10-CM | POA: Diagnosis not present

## 2022-05-24 DIAGNOSIS — Z96641 Presence of right artificial hip joint: Secondary | ICD-10-CM | POA: Diagnosis not present

## 2022-05-24 NOTE — Progress Notes (Signed)
The patient comes in today now 6 months status post a left hip replacement and 40-monthstatus post right hip replacement.  She is working with physical therapy and this is the best have seen her in terms of how she does looks overall.  She is incredibly motivated she looks happy.  She is been going to physical therapy and actually doing aquatic therapy as well.  For the first time in a long time I watched her walk without an assistive device.  She still using a rolling walker which is appropriate but she just looks great.  On exam both hips move smoothly and fluidly.  Her more recent left hip has more scar tissue in the right side but the incision is closed over and there is no evidence infection.  She will continue to increase his activities as comfort allows.  We will see her back in 3 months and at that visit we will have an AP pelvis and lateral both hips.  If there are issues before then she knows to let [place]ore[place]now.

## 2022-07-28 ENCOUNTER — Ambulatory Visit
Admission: RE | Admit: 2022-07-28 | Discharge: 2022-07-28 | Disposition: A | Discharge status: Home / Self Care | Payer: Medicare Other | Source: Ambulatory Visit | Attending: Family Medicine | Admitting: Family Medicine

## 2022-07-28 ENCOUNTER — Other Ambulatory Visit: Payer: Self-pay | Admitting: Family Medicine

## 2022-07-28 DIAGNOSIS — M25511 Pain in right shoulder: Secondary | ICD-10-CM

## 2022-07-28 DIAGNOSIS — M25512 Pain in left shoulder: Secondary | ICD-10-CM

## 2022-08-18 ENCOUNTER — Encounter: Payer: Self-pay | Admitting: Family

## 2022-08-18 ENCOUNTER — Other Ambulatory Visit: Payer: Self-pay | Admitting: *Deleted

## 2022-08-18 DIAGNOSIS — I2699 Other pulmonary embolism without acute cor pulmonale: Secondary | ICD-10-CM

## 2022-08-18 MED ORDER — APIXABAN 2.5 MG PO TABS: 2.5000 mg | ORAL_TABLET | Freq: Two times a day (BID) | ORAL | 11 refills | Status: DC

## 2022-08-24 ENCOUNTER — Telehealth: Payer: Self-pay

## 2022-08-24 ENCOUNTER — Ambulatory Visit: Payer: Medicare Other | Admitting: Orthopaedic Surgery

## 2022-08-24 NOTE — Telephone Encounter (Signed)
Faxed [facility] patient assistance paperwork. Patient informed.

## 2022-08-28 ENCOUNTER — Ambulatory Visit: Payer: Medicare Other | Admitting: Orthopaedic Surgery

## 2022-09-05 ENCOUNTER — Ambulatory Visit (INDEPENDENT_AMBULATORY_CARE_PROVIDER_SITE_OTHER): Payer: Medicare Other | Admitting: Orthopaedic Surgery

## 2022-09-05 ENCOUNTER — Encounter: Payer: Self-pay | Admitting: Orthopaedic Surgery

## 2022-09-05 ENCOUNTER — Ambulatory Visit (INDEPENDENT_AMBULATORY_CARE_PROVIDER_SITE_OTHER): Payer: Medicare Other

## 2022-09-05 DIAGNOSIS — Z96642 Presence of left artificial hip joint: Secondary | ICD-10-CM

## 2022-09-05 DIAGNOSIS — Z96641 Presence of right artificial hip joint: Secondary | ICD-10-CM | POA: Diagnosis not present

## 2022-09-05 NOTE — Progress Notes (Signed)
The patient is well-known to me.  We replaced her right hip in December 2022 and her left hip in May 2023.  She has been undergoing pelvic floor physical therapy for tailbone pain and she said that is actually done well for her and the therapist is actually coming to her house.  She does have some spasms on the lateral side of her hip and she does take 2 ibuprofen at bedtime.  Overall though she looks much better and she is not even walking with a walker like she used to in the to use.  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 Marlou Sa.  Both hips move smoothly and fluidly with no blocks or rotation.  Her incisions of healed nicely and that her left incision still shows some hard tissue but it looks great overall.  An AP pelvis and lateral both hips shows well-seated total hip arthroplasties with no complicating features.  This point follow-up for her hips is as needed.  If she does develop any issues and pain that does not seem to subside she needs to contact us and be seen.  All questions and concerns were addressed and answered.

## 2022-09-14 ENCOUNTER — Encounter: Payer: Self-pay | Admitting: Radiology

## 2022-10-23 ENCOUNTER — Encounter: Payer: Self-pay | Admitting: Family

## 2022-10-23 ENCOUNTER — Inpatient Hospital Stay (HOSPITAL_BASED_OUTPATIENT_CLINIC_OR_DEPARTMENT_OTHER): Payer: Medicare Other | Admitting: Family

## 2022-10-23 ENCOUNTER — Inpatient Hospital Stay: Payer: Medicare Other | Attending: Hematology & Oncology

## 2022-10-23 VITALS — BP 127/87 | HR 82 | Temp 98.2°F | Resp 17 | Wt 322.0 lb

## 2022-10-23 DIAGNOSIS — I70208 Unspecified atherosclerosis of native arteries of extremities, other extremity: Secondary | ICD-10-CM

## 2022-10-23 DIAGNOSIS — Z7901 Long term (current) use of anticoagulants: Secondary | ICD-10-CM | POA: Insufficient documentation

## 2022-10-23 DIAGNOSIS — I2699 Other pulmonary embolism without acute cor pulmonale: Secondary | ICD-10-CM

## 2022-10-23 DIAGNOSIS — D751 Secondary polycythemia: Secondary | ICD-10-CM

## 2022-10-23 DIAGNOSIS — D6859 Other primary thrombophilia: Secondary | ICD-10-CM | POA: Diagnosis not present

## 2022-10-23 DIAGNOSIS — Z886 Allergy status to analgesic agent status: Secondary | ICD-10-CM | POA: Insufficient documentation

## 2022-10-23 DIAGNOSIS — Z79899 Other long term (current) drug therapy: Secondary | ICD-10-CM | POA: Insufficient documentation

## 2022-10-23 DIAGNOSIS — R0602 Shortness of breath: Secondary | ICD-10-CM | POA: Diagnosis not present

## 2022-10-23 DIAGNOSIS — I742 Embolism and thrombosis of arteries of the upper extremities: Secondary | ICD-10-CM | POA: Insufficient documentation

## 2022-10-23 DIAGNOSIS — R42 Dizziness and giddiness: Secondary | ICD-10-CM | POA: Insufficient documentation

## 2022-10-23 LAB — CBC WITH DIFFERENTIAL (CANCER CENTER ONLY)
Abs Immature Granulocytes: 0 10*3/uL (ref 0.00–0.07)
Basophils Absolute: 0 10*3/uL (ref 0.0–0.1)
Basophils Relative: 1 %
Eosinophils Absolute: 0.3 10*3/uL (ref 0.0–0.5)
Eosinophils Relative: 4 %
HCT: 46.4 % — ABNORMAL HIGH (ref 36.0–46.0)
Hemoglobin: 15.6 g/dL — ABNORMAL HIGH (ref 12.0–15.0)
Immature Granulocytes: 0 %
Lymphocytes Relative: 32 %
Lymphs Abs: 2 10*3/uL (ref 0.7–4.0)
MCH: 30.2 pg (ref 26.0–34.0)
MCHC: 33.6 g/dL (ref 30.0–36.0)
MCV: 89.7 fL (ref 80.0–100.0)
Monocytes Absolute: 0.5 10*3/uL (ref 0.1–1.0)
Monocytes Relative: 8 %
Neutro Abs: 3.5 10*3/uL (ref 1.7–7.7)
Neutrophils Relative %: 55 %
Platelet Count: 209 10*3/uL (ref 150–400)
RBC: 5.17 MIL/uL — ABNORMAL HIGH (ref 3.87–5.11)
RDW: 13.2 % (ref 11.5–15.5)
WBC Count: 6.3 10*3/uL (ref 4.0–10.5)
nRBC: 0 % (ref 0.0–0.2)

## 2022-10-23 LAB — CMP (CANCER CENTER ONLY)
ALT: 12 U/L (ref 0–44)
AST: 18 U/L (ref 15–41)
Albumin: 4.1 g/dL (ref 3.5–5.0)
Alkaline Phosphatase: 81 U/L (ref 38–126)
Anion gap: 6 (ref 5–15)
BUN: 18 mg/dL (ref 8–23)
CO2: 31 mmol/L (ref 22–32)
Calcium: 9.7 mg/dL (ref 8.9–10.3)
Chloride: 104 mmol/L (ref 98–111)
Creatinine: 0.82 mg/dL (ref 0.44–1.00)
GFR, Estimated: >60 mL/min (ref >60)
Glucose, Bld: 101 mg/dL — ABNORMAL HIGH (ref 70–99)
Potassium: 5.5 mmol/L — ABNORMAL HIGH (ref 3.5–5.1)
Sodium: 141 mmol/L (ref 135–145)
Total Bilirubin: 0.5 mg/dL (ref 0.3–1.2)
Total Protein: 7.1 g/dL (ref 6.5–8.1)

## 2022-10-23 NOTE — Progress Notes (Signed)
Hematology and Oncology Follow Up Visit  Leslie Knapp [id number] 1951/03/12 72 y.o. 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 She is doing well in PT working on balance/dizziness and strengthening. She ambulate with her Rolator for added support.  Thankfully she has not had any falls or syncope.  She notes some mild SOB with over exertion and will rest when needed.  No issues with bleeding or bruising. No petechiae.  No fever, chills, n/v, cough, rash, chest pain, palpitations, abdominal pain or changes in bowel or bladder habits.  No swelling, numbness or tingling in her extremities at this time.  Appetite and hydration are good. Weight is 322 lbs.   ECOG Performance Status: 1 - Symptomatic but completely ambulatory  Medications:  Allergies as of 10/23/2022       Reactions   Celebrex [celecoxib] Swelling, Rash   Lactose Intolerance (gi) Other (See Comments)        Medication List        Accurate as of October 23, 2022  8:47 AM. If you have any questions, ask your nurse or doctor.          apixaban 2.5 MG Tabs tablet Commonly known as: ELIQUIS Take 1 tablet (2.5 mg total) by mouth 2 (two) times daily.   Cholecalciferol 25 MCG/0.03ML Liqd Take 1,000 Units by mouth daily.   Enzyme Digest Caps Take 1 capsule by mouth daily as needed (heartburn).   ibuprofen 200 MG tablet Commonly known as: ADVIL Take 400 mg by mouth at bedtime as needed.   methocarbamol 500 MG tablet Commonly known as: ROBAXIN Take 1 tablet (500 mg total) by mouth every 6 (six) hours as needed for muscle spasms.   MINERALS PO Take 1 capsule by mouth daily.        Allergies:  Allergies  Allergen Reactions    Celebrex [Celecoxib] Swelling and Rash   Lactose Intolerance (Gi) Other (See Comments)    Past Medical History, Surgical history, Social history, and Family History were reviewed and updated.  Review of Systems: All other 10 point review of systems is negative.   Physical Exam:  weight is 322 lb (146.1 kg) (abnormal). Her oral temperature is 98.2 F (36.8 C). Her blood pressure is 127/87 and her pulse is 82. Her respiration is 17 and oxygen saturation is 100%.   Wt Readings from Last 3 Encounters:  10/23/22 (!) 322 lb (146.1 kg)  04/24/22 (!) 309 lb 12.8 oz (140.5 kg)  11/27/21 292 lb 1.8 oz (132.5 kg)    Ocular: Sclerae unicteric, pupils equal, round and reactive to light Ear-nose-throat: Oropharynx clear, dentition fair Lymphatic: No cervical or supraclavicular adenopathy Lungs no rales or rhonchi, good excursion bilaterally Heart regular rate and rhythm, no murmur appreciated Abd soft, nontender, positive bowel sounds MSK no focal spinal tenderness, no joint edema Neuro: non-focal, well-oriented, appropriate affect Breasts: Deferred   Lab Results  Component Value Date   WBC 6.3 10/23/2022   HGB 15.6 (H) 10/23/2022   HCT 46.4 (H) 10/23/2022   MCV 89.7 10/23/2022   PLT 209 10/23/2022   Lab Results  Component Value Date   FERRITIN 129 08/23/2020   IRON 63 08/23/2020   TIBC 383 08/23/2020  UIBC 319 08/23/2020   IRONPCTSAT 16 (L) 08/23/2020   Lab Results  Component Value Date   RBC 5.17 (H) 10/23/2022   No results found for: "KPAFRELGTCHN", "LAMBDASER", "KAPLAMBRATIO" No results found for: "IGGSERUM", "IGA", "IGMSERUM" No results found for: "TOTALPROTELP", "ALBUMINELP", "A1GS", "A2GS", "BETS", "BETA2SER", "GAMS", "MSPIKE", "SPEI"   Chemistry      Component Value Date/Time   NA 140 04/24/2022 0814   NA 140 09/24/2019 1934   K 4.5 04/24/2022 0814   CL 105 04/24/2022 0814   CO2 28 04/24/2022 0814   BUN 18 04/24/2022 0814   BUN 15 09/24/2019 1934   CREATININE  0.75 04/24/2022 0814   GLU 87 05/21/2018 0000      Component Value Date/Time   CALCIUM 10.0 04/24/2022 0814   ALKPHOS 118 04/24/2022 0814   AST 17 04/24/2022 0814   ALT 10 04/24/2022 0814   BILITOT 0.5 04/24/2022 0814       Impression and Plan: Leslie Knapp is a very pleasant 72 yo caucasian female with history of bilateral saddle pulmonary emboli with right heart strain as well as a right brachial artery thrombus. She was noted to have a protein C and protein S deficiency.  She continues to tolerate Eliquis maintenance nicely without any issues. No changes to her current regimen.  Follow-up in 6 months.   Leslie Stanford, NP 4/15/20248:47 AM

## 2023-04-19 ENCOUNTER — Other Ambulatory Visit: Payer: Self-pay

## 2023-04-19 ENCOUNTER — Encounter: Payer: Self-pay | Admitting: Family

## 2023-04-19 DIAGNOSIS — E559 Vitamin D deficiency, unspecified: Secondary | ICD-10-CM

## 2023-04-19 DIAGNOSIS — D6859 Other primary thrombophilia: Secondary | ICD-10-CM

## 2023-04-19 DIAGNOSIS — M7989 Other specified soft tissue disorders: Secondary | ICD-10-CM

## 2023-04-19 DIAGNOSIS — I70208 Unspecified atherosclerosis of native arteries of extremities, other extremity: Secondary | ICD-10-CM

## 2023-04-24 ENCOUNTER — Other Ambulatory Visit: Payer: Self-pay

## 2023-04-24 ENCOUNTER — Encounter: Payer: Self-pay | Admitting: Hematology & Oncology

## 2023-04-24 ENCOUNTER — Inpatient Hospital Stay: Payer: Medicare Other | Attending: Family

## 2023-04-24 ENCOUNTER — Inpatient Hospital Stay (HOSPITAL_BASED_OUTPATIENT_CLINIC_OR_DEPARTMENT_OTHER): Payer: Medicare Other | Admitting: Hematology & Oncology

## 2023-04-24 VITALS — BP 124/74 | HR 84 | Temp 98.6°F | Resp 17 | Wt 322.0 lb

## 2023-04-24 DIAGNOSIS — Z8616 Personal history of COVID-19: Secondary | ICD-10-CM | POA: Insufficient documentation

## 2023-04-24 DIAGNOSIS — I2699 Other pulmonary embolism without acute cor pulmonale: Secondary | ICD-10-CM

## 2023-04-24 DIAGNOSIS — Z86711 Personal history of pulmonary embolism: Secondary | ICD-10-CM | POA: Diagnosis not present

## 2023-04-24 DIAGNOSIS — Z79899 Other long term (current) drug therapy: Secondary | ICD-10-CM | POA: Diagnosis not present

## 2023-04-24 DIAGNOSIS — Z95828 Presence of other vascular implants and grafts: Secondary | ICD-10-CM | POA: Diagnosis not present

## 2023-04-24 DIAGNOSIS — M791 Myalgia, unspecified site: Secondary | ICD-10-CM | POA: Diagnosis not present

## 2023-04-24 DIAGNOSIS — D6859 Other primary thrombophilia: Secondary | ICD-10-CM | POA: Insufficient documentation

## 2023-04-24 DIAGNOSIS — D751 Secondary polycythemia: Secondary | ICD-10-CM

## 2023-04-24 DIAGNOSIS — M2550 Pain in unspecified joint: Secondary | ICD-10-CM | POA: Insufficient documentation

## 2023-04-24 DIAGNOSIS — E559 Vitamin D deficiency, unspecified: Secondary | ICD-10-CM | POA: Diagnosis not present

## 2023-04-24 DIAGNOSIS — Z886 Allergy status to analgesic agent status: Secondary | ICD-10-CM | POA: Insufficient documentation

## 2023-04-24 DIAGNOSIS — I742 Embolism and thrombosis of arteries of the upper extremities: Secondary | ICD-10-CM | POA: Diagnosis present

## 2023-04-24 DIAGNOSIS — I2692 Saddle embolus of pulmonary artery without acute cor pulmonale: Secondary | ICD-10-CM | POA: Diagnosis not present

## 2023-04-24 DIAGNOSIS — M7989 Other specified soft tissue disorders: Secondary | ICD-10-CM

## 2023-04-24 DIAGNOSIS — I70208 Unspecified atherosclerosis of native arteries of extremities, other extremity: Secondary | ICD-10-CM

## 2023-04-24 LAB — CMP (CANCER CENTER ONLY)
ALT: 11 U/L (ref 0–44)
AST: 20 U/L (ref 15–41)
Albumin: 4 g/dL (ref 3.5–5.0)
Alkaline Phosphatase: 84 U/L (ref 38–126)
Anion gap: 7 (ref 5–15)
BUN: 15 mg/dL (ref 8–23)
CO2: 29 mmol/L (ref 22–32)
Calcium: 9.5 mg/dL (ref 8.9–10.3)
Chloride: 104 mmol/L (ref 98–111)
Creatinine: 0.75 mg/dL (ref 0.44–1.00)
GFR, Estimated: >60 mL/min (ref >60)
Glucose, Bld: 107 mg/dL — ABNORMAL HIGH (ref 70–99)
Potassium: 4.4 mmol/L (ref 3.5–5.1)
Sodium: 140 mmol/L (ref 135–145)
Total Bilirubin: 0.5 mg/dL (ref 0.3–1.2)
Total Protein: 7 g/dL (ref 6.5–8.1)

## 2023-04-24 LAB — CBC WITH DIFFERENTIAL (CANCER CENTER ONLY)
Abs Immature Granulocytes: 0.02 10*3/uL (ref 0.00–0.07)
Basophils Absolute: 0 10*3/uL (ref 0.0–0.1)
Basophils Relative: 0 %
Eosinophils Absolute: 0.2 10*3/uL (ref 0.0–0.5)
Eosinophils Relative: 3 %
HCT: 47.5 % — ABNORMAL HIGH (ref 36.0–46.0)
Hemoglobin: 16.1 g/dL — ABNORMAL HIGH (ref 12.0–15.0)
Immature Granulocytes: 0 %
Lymphocytes Relative: 39 %
Lymphs Abs: 2.4 10*3/uL (ref 0.7–4.0)
MCH: 30.2 pg (ref 26.0–34.0)
MCHC: 33.9 g/dL (ref 30.0–36.0)
MCV: 89.1 fL (ref 80.0–100.0)
Monocytes Absolute: 0.4 10*3/uL (ref 0.1–1.0)
Monocytes Relative: 6 %
Neutro Abs: 3.3 10*3/uL (ref 1.7–7.7)
Neutrophils Relative %: 52 %
Platelet Count: 200 10*3/uL (ref 150–400)
RBC: 5.33 MIL/uL — ABNORMAL HIGH (ref 3.87–5.11)
RDW: 12.7 % (ref 11.5–15.5)
WBC Count: 6.2 10*3/uL (ref 4.0–10.5)
nRBC: 0 % (ref 0.0–0.2)

## 2023-04-24 LAB — VITAMIN B12: Vitamin B-12: 392 pg/mL (ref 180–914)

## 2023-04-24 LAB — URIC ACID: Uric Acid, Serum: 5.7 mg/dL (ref 2.5–7.1)

## 2023-04-24 LAB — VITAMIN D 25 HYDROXY (VIT D DEFICIENCY, FRACTURES): Vit D, 25-Hydroxy: 43.47 ng/mL (ref 30–100)

## 2023-04-24 NOTE — Progress Notes (Signed)
Hematology and Oncology Follow Up Visit  Leslie Knapp [id number] 1950-10-26 72 y.o. 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  She did have left hip surgery back in May.  Unfortunate, she had problems with recovery.  Apparently the stapler they put and fell out.  She had some wound healing issues.  Hopefully, this is now better.  She has some transient swelling in the right thigh recently.  We will check a uric acid level on her.  Her Protein S and Protein C levels have been normal for the past year.  She wants to have these rechecked..  She does get around with a rolling walker.  This is helps with her stability.  She has had no change in bowel or bladder habits.  She has had no bleeding.  She is on a maintenance dose of Eliquis.  Because of the hip, has not been able to exercise like she likes.  She did have COVID in July.  She was not on any medications for this.  She was on antibiotics to try to help prevent pneumonia.  She has had no headache.  There is been no nausea or vomiting.  She has had no obvious bleeding.  Overall, her performance status is ECOG 1.     Medications:  Allergies as of 04/24/2023       Reactions   Celebrex [celecoxib] Swelling, Rash   Lactose Intolerance (gi) Other (See Comments)        Medication List        Accurate as of April 24, 2023  8:40 AM. If you have any questions, ask your nurse or doctor.          apixaban 2.5 MG Tabs tablet Commonly known as: ELIQUIS Take 1 tablet (2.5 mg total) by mouth 2 (two) times daily.   Cholecalciferol 25 MCG/0.03ML Liqd Take 1,000 Units by mouth daily.   Enzyme Digest Caps Take 1 capsule by mouth daily as needed  (heartburn).   ibuprofen 200 MG tablet Commonly known as: ADVIL Take 400 mg by mouth at bedtime as needed.   methocarbamol 500 MG tablet Commonly known as: ROBAXIN Take 1 tablet (500 mg total) by mouth every 6 (six) hours as needed for muscle spasms.   MINERALS PO Take 1 capsule by mouth daily.        Allergies:  Allergies  Allergen Reactions   Celebrex [Celecoxib] Swelling and Rash   Lactose Intolerance (Gi) Other (See Comments)    Past Medical History, Surgical history, Social history, and Family History were reviewed and updated.  Review of Systems: Review of Systems  Constitutional: Negative.   HENT: Negative.    Eyes: Negative.   Respiratory: Negative.    Cardiovascular: Negative.   Gastrointestinal: Negative.   Genitourinary: Negative.   Musculoskeletal:  Positive for joint pain and myalgias.  Skin: Negative.   Neurological: Negative.   Endo/Heme/Allergies: Negative.   Psychiatric/Behavioral: Negative.       Physical Exam: Temperature is 98.6.  Pulse 84.  Blood pressure 124/74.  Weight is 322 pounds.  Wt Readings from Last 3 Encounters:  10/23/22 (!) 322 lb (146.1 kg)  04/24/22 (!) 309 lb 12.8 oz (140.5 kg)  11/27/21 292  lb 1.8 oz (132.5 kg)    Physical Exam Vitals reviewed.  HENT:     Head: Normocephalic and atraumatic.  Eyes:     Pupils: Pupils are equal, round, and reactive to light.  Cardiovascular:     Rate and Rhythm: Normal rate and regular rhythm.     Heart sounds: Normal heart sounds.  Pulmonary:     Effort: Pulmonary effort is normal.     Breath sounds: Normal breath sounds.  Abdominal:     General: Bowel sounds are normal.     Palpations: Abdomen is soft.  Musculoskeletal:        General: No tenderness or deformity. Normal range of motion.     Cervical back: Normal range of motion.     Comments: She does have compression stockings on both legs.  This goes up to her knees.  I really cannot palpate any obvious venous cord.  She has  no erythema or swelling.  Lymphadenopathy:     Cervical: No cervical adenopathy.  Skin:    General: Skin is warm and dry.     Findings: No erythema or rash.  Neurological:     Mental Status: She is alert and oriented to person, place, and time.  Psychiatric:        Behavior: Behavior normal.        Thought Content: Thought content normal.        Judgment: Judgment normal.      Lab Results  Component Value Date   WBC 6.2 04/24/2023   HGB 16.1 (H) 04/24/2023   HCT 47.5 (H) 04/24/2023   MCV 89.1 04/24/2023   PLT 200 04/24/2023   Lab Results  Component Value Date   FERRITIN 129 08/23/2020   IRON 63 08/23/2020   TIBC 383 08/23/2020   UIBC 319 08/23/2020   IRONPCTSAT 16 (L) 08/23/2020   Lab Results  Component Value Date   RBC 5.33 (H) 04/24/2023   No results found for: "KPAFRELGTCHN", "LAMBDASER", "KAPLAMBRATIO" No results found for: "IGGSERUM", "IGA", "IGMSERUM" No results found for: "TOTALPROTELP", "ALBUMINELP", "A1GS", "A2GS", "BETS", "BETA2SER", "GAMS", "MSPIKE", "SPEI"   Chemistry      Component Value Date/Time   NA 141 10/23/2022 0831   NA 140 09/24/2019 1934   K 5.5 (H) 10/23/2022 0831   CL 104 10/23/2022 0831   CO2 31 10/23/2022 0831   BUN 18 10/23/2022 0831   BUN 15 09/24/2019 1934   CREATININE 0.82 10/23/2022 0831   GLU 87 05/21/2018 0000      Component Value Date/Time   CALCIUM 9.7 10/23/2022 0831   ALKPHOS 81 10/23/2022 0831   AST 18 10/23/2022 0831   ALT 12 10/23/2022 0831   BILITOT 0.5 10/23/2022 0831       Impression and Plan: Leslie Knapp is a very pleasant 72 yo caucasian female with history of bilateral saddle pulmonary emboli with right heart strain as well as a right brachial artery thrombus. She was noted to have a protein C and protein S deficiency. Currently, she does not have any deficit of the Protein C and Protein S.  Again we are checking these.  I would not change the Eliquis dose at this point.  I do not see that thromboembolic  disease is a problem.  We will plan to get her back after the Holidays.  I would like to see her back in about 3 months or so.    Leslie Macho, MD 10/15/20248:40 AM

## 2023-04-25 LAB — PROTEIN C ACTIVITY: Protein C Activity: 117 % (ref 73–180)

## 2023-04-25 LAB — PROTEIN C, TOTAL: Protein C, Total: 96 % (ref 60–150)

## 2023-04-25 LAB — PROTEIN S, TOTAL: Protein S Ag, Total: 88 % (ref 60–150)

## 2023-04-25 LAB — PROTEIN S ACTIVITY: Protein S Activity: 97 % (ref 63–140)

## 2023-07-25 ENCOUNTER — Other Ambulatory Visit: Payer: Medicare Other

## 2023-07-25 ENCOUNTER — Ambulatory Visit: Payer: Medicare Other | Admitting: Family

## 2023-08-01 ENCOUNTER — Telehealth: Payer: Self-pay | Admitting: *Deleted

## 2023-08-01 NOTE — Telephone Encounter (Signed)
[employer] Patient assistance information faxed to [facility] along with patient financial information.  Confirmation received.

## 2023-08-13 ENCOUNTER — Inpatient Hospital Stay (HOSPITAL_BASED_OUTPATIENT_CLINIC_OR_DEPARTMENT_OTHER): Payer: Medicare Other | Admitting: Family

## 2023-08-13 ENCOUNTER — Inpatient Hospital Stay: Payer: Medicare Other | Attending: Family

## 2023-08-13 ENCOUNTER — Encounter: Payer: Self-pay | Admitting: Family

## 2023-08-13 VITALS — BP 124/72 | HR 75 | Temp 97.8°F | Resp 19 | Ht 68.9 in | Wt 331.1 lb

## 2023-08-13 DIAGNOSIS — I2692 Saddle embolus of pulmonary artery without acute cor pulmonale: Secondary | ICD-10-CM | POA: Insufficient documentation

## 2023-08-13 DIAGNOSIS — Z886 Allergy status to analgesic agent status: Secondary | ICD-10-CM | POA: Insufficient documentation

## 2023-08-13 DIAGNOSIS — I2699 Other pulmonary embolism without acute cor pulmonale: Secondary | ICD-10-CM

## 2023-08-13 DIAGNOSIS — Z86711 Personal history of pulmonary embolism: Secondary | ICD-10-CM | POA: Diagnosis not present

## 2023-08-13 DIAGNOSIS — K219 Gastro-esophageal reflux disease without esophagitis: Secondary | ICD-10-CM | POA: Insufficient documentation

## 2023-08-13 DIAGNOSIS — Z7901 Long term (current) use of anticoagulants: Secondary | ICD-10-CM | POA: Diagnosis not present

## 2023-08-13 DIAGNOSIS — I742 Embolism and thrombosis of arteries of the upper extremities: Secondary | ICD-10-CM | POA: Insufficient documentation

## 2023-08-13 DIAGNOSIS — Z79899 Other long term (current) drug therapy: Secondary | ICD-10-CM | POA: Insufficient documentation

## 2023-08-13 DIAGNOSIS — Z95828 Presence of other vascular implants and grafts: Secondary | ICD-10-CM

## 2023-08-13 DIAGNOSIS — D6859 Other primary thrombophilia: Secondary | ICD-10-CM

## 2023-08-13 DIAGNOSIS — I70208 Unspecified atherosclerosis of native arteries of extremities, other extremity: Secondary | ICD-10-CM | POA: Diagnosis not present

## 2023-08-13 DIAGNOSIS — D751 Secondary polycythemia: Secondary | ICD-10-CM | POA: Diagnosis not present

## 2023-08-13 LAB — CBC WITH DIFFERENTIAL (CANCER CENTER ONLY)
Abs Immature Granulocytes: 0.02 10*3/uL (ref 0.00–0.07)
Basophils Absolute: 0 10*3/uL (ref 0.0–0.1)
Basophils Relative: 1 %
Eosinophils Absolute: 0.2 10*3/uL (ref 0.0–0.5)
Eosinophils Relative: 3 %
HCT: 47.4 % — ABNORMAL HIGH (ref 36.0–46.0)
Hemoglobin: 16 g/dL — ABNORMAL HIGH (ref 12.0–15.0)
Immature Granulocytes: 0 %
Lymphocytes Relative: 31 %
Lymphs Abs: 1.8 10*3/uL (ref 0.7–4.0)
MCH: 30.1 pg (ref 26.0–34.0)
MCHC: 33.8 g/dL (ref 30.0–36.0)
MCV: 89.3 fL (ref 80.0–100.0)
Monocytes Absolute: 0.5 10*3/uL (ref 0.1–1.0)
Monocytes Relative: 8 %
Neutro Abs: 3.3 10*3/uL (ref 1.7–7.7)
Neutrophils Relative %: 57 %
Platelet Count: 202 10*3/uL (ref 150–400)
RBC: 5.31 MIL/uL — ABNORMAL HIGH (ref 3.87–5.11)
RDW: 12.9 % (ref 11.5–15.5)
WBC Count: 5.7 10*3/uL (ref 4.0–10.5)
nRBC: 0 % (ref 0.0–0.2)

## 2023-08-13 LAB — CMP (CANCER CENTER ONLY)
ALT: 14 U/L (ref 0–44)
AST: 20 U/L (ref 15–41)
Albumin: 4.2 g/dL (ref 3.5–5.0)
Alkaline Phosphatase: 77 U/L (ref 38–126)
Anion gap: 6 (ref 5–15)
BUN: 14 mg/dL (ref 8–23)
CO2: 29 mmol/L (ref 22–32)
Calcium: 9.4 mg/dL (ref 8.9–10.3)
Chloride: 104 mmol/L (ref 98–111)
Creatinine: 0.72 mg/dL (ref 0.44–1.00)
GFR, Estimated: >60 mL/min (ref >60)
Glucose, Bld: 91 mg/dL (ref 70–99)
Potassium: 5.1 mmol/L (ref 3.5–5.1)
Sodium: 139 mmol/L (ref 135–145)
Total Bilirubin: 0.6 mg/dL (ref 0.0–1.2)
Total Protein: 6.9 g/dL (ref 6.5–8.1)

## 2023-08-13 NOTE — Progress Notes (Signed)
Hematology and Oncology Follow Up Visit  Leslie Knapp [id number] 12-27-1950 73 y.o. 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 She is doing fairly well getting around. The soreness in her hips is much better.  She uses her Rolator for added support.  No falls or syncope reported.  SOB has improved tremendously with the new air purifier she purchased. No fever, chills, n/v, cough, rash, dizziness, chest pain, palpitations, abdominal pain or changes in bowel or bladder habits at this time.  She notes GERD with red meat.  She is wearing compression stockings for support on both legs.  Pedal pulses are 1+.  Appetite and hydration are good. Weight is 331 lbs.  She has not noted any blood loss. No abnormal bruising. No petechiae.   ECOG Performance Status: 1 - Symptomatic but completely ambulatory  Medications:  Allergies as of 08/13/2023       Reactions   Celebrex [celecoxib] Swelling, Rash   Lactose Intolerance (gi) Other (See Comments)        Medication List        Accurate as of August 13, 2023  8:38 AM. If you have any questions, ask your nurse or doctor.          apixaban 2.5 MG Tabs tablet Commonly known as: ELIQUIS Take 1 tablet (2.5 mg total) by mouth 2 (two) times daily.   Cholecalciferol 25 MCG/0.03ML Liqd Take 1,000 Units by mouth daily.   Enzyme Digest Caps Take 1 capsule by mouth daily as needed (heartburn).   ibuprofen 200 MG tablet Commonly known as: ADVIL Take 400 mg by mouth at bedtime as needed.   MINERALS PO Take 1 capsule by mouth daily.        Allergies:  Allergies  Allergen Reactions   Celebrex [Celecoxib] Swelling and Rash   Lactose Intolerance (Gi) Other  (See Comments)    Past Medical History, Surgical history, Social history, and Family History were reviewed and updated.  Review of Systems: All other 10 point review of systems is negative.   Physical Exam:  vitals were not taken for this visit.   Wt Readings from Last 3 Encounters:  04/24/23 (!) 322 lb (146.1 kg)  10/23/22 (!) 322 lb (146.1 kg)  04/24/22 (!) 309 lb 12.8 oz (140.5 kg)    Ocular: Sclerae unicteric, pupils equal, round and reactive to light Ear-nose-throat: Oropharynx clear, dentition fair Lymphatic: No cervical or supraclavicular adenopathy Lungs no rales or rhonchi, good excursion bilaterally Heart regular rate and rhythm, no murmur appreciated Abd soft, nontender, positive bowel sounds MSK no focal spinal tenderness, no joint edema Neuro: non-focal, well-oriented, appropriate affect Breasts: Deferred   Lab Results  Component Value Date   WBC 5.7 08/13/2023   HGB 16.0 (H) 08/13/2023   HCT 47.4 (H) 08/13/2023   MCV 89.3 08/13/2023   PLT 202 08/13/2023   Lab Results  Component Value Date   FERRITIN 129 08/23/2020   IRON 63 08/23/2020   TIBC 383 08/23/2020   UIBC 319 08/23/2020   IRONPCTSAT 16 (L) 08/23/2020   Lab Results  Component Value Date   RBC 5.31 (H) 08/13/2023   No results found for: "KPAFRELGTCHN", "LAMBDASER", "KAPLAMBRATIO" No results found for: "IGGSERUM", "  IGA", "IGMSERUM" No results found for: "TOTALPROTELP", "ALBUMINELP", "A1GS", "A2GS", "BETS", "BETA2SER", "GAMS", "MSPIKE", "SPEI"   Chemistry      Component Value Date/Time   NA 140 04/24/2023 0814   NA 140 09/24/2019 1934   K 4.4 04/24/2023 0814   CL 104 04/24/2023 0814   CO2 29 04/24/2023 0814   BUN 15 04/24/2023 0814   BUN 15 09/24/2019 1934   CREATININE 0.75 04/24/2023 0814   GLU 87 05/21/2018 0000      Component Value Date/Time   CALCIUM 9.5 04/24/2023 0814   ALKPHOS 84 04/24/2023 0814   AST 20 04/24/2023 0814   ALT 11 04/24/2023 0814   BILITOT 0.5 04/24/2023 0814        Impression and Plan: Leslie Knapp is a very pleasant 73 yo caucasian female with history of bilateral saddle pulmonary emboli with right heart strain as well as a right brachial artery thrombus. She was noted to have a protein C and protein S deficiency at the time.  No recent protein S or C deficiency noted.  Continue same regimen with Eliquis.  Follow-up in 4 months.   Eileen Stanford, NP 2/3/20258:38 AM

## 2023-09-04 ENCOUNTER — Other Ambulatory Visit: Payer: Self-pay | Admitting: Hematology & Oncology

## 2023-09-04 DIAGNOSIS — I2699 Other pulmonary embolism without acute cor pulmonale: Secondary | ICD-10-CM

## 2023-10-17 ENCOUNTER — Other Ambulatory Visit (HOSPITAL_BASED_OUTPATIENT_CLINIC_OR_DEPARTMENT_OTHER): Payer: Self-pay | Admitting: Family Medicine

## 2023-10-17 ENCOUNTER — Encounter (HOSPITAL_BASED_OUTPATIENT_CLINIC_OR_DEPARTMENT_OTHER): Payer: Self-pay | Admitting: Family Medicine

## 2023-10-17 DIAGNOSIS — Z1231 Encounter for screening mammogram for malignant neoplasm of breast: Secondary | ICD-10-CM

## 2023-10-18 ENCOUNTER — Other Ambulatory Visit (HOSPITAL_BASED_OUTPATIENT_CLINIC_OR_DEPARTMENT_OTHER): Payer: Self-pay | Admitting: Family Medicine

## 2023-10-18 DIAGNOSIS — E041 Nontoxic single thyroid nodule: Secondary | ICD-10-CM

## 2023-10-23 ENCOUNTER — Ambulatory Visit (HOSPITAL_BASED_OUTPATIENT_CLINIC_OR_DEPARTMENT_OTHER)
Admission: RE | Admit: 2023-10-23 | Discharge: 2023-10-23 | Disposition: A | Discharge status: Home / Self Care | Source: Ambulatory Visit | Attending: Family Medicine | Admitting: Family Medicine

## 2023-10-23 DIAGNOSIS — Z1231 Encounter for screening mammogram for malignant neoplasm of breast: Secondary | ICD-10-CM | POA: Insufficient documentation

## 2023-10-27 ENCOUNTER — Ambulatory Visit (HOSPITAL_BASED_OUTPATIENT_CLINIC_OR_DEPARTMENT_OTHER)
Admission: RE | Admit: 2023-10-27 | Discharge: 2023-10-27 | Disposition: A | Discharge status: Home / Self Care | Source: Ambulatory Visit | Attending: Family Medicine | Admitting: Family Medicine

## 2023-10-27 DIAGNOSIS — E041 Nontoxic single thyroid nodule: Secondary | ICD-10-CM | POA: Diagnosis present

## 2023-11-08 ENCOUNTER — Encounter (HOSPITAL_COMMUNITY): Payer: Self-pay | Admitting: Endocrinology

## 2023-11-09 ENCOUNTER — Other Ambulatory Visit (HOSPITAL_COMMUNITY): Payer: Self-pay | Admitting: Endocrinology

## 2023-11-09 DIAGNOSIS — E042 Nontoxic multinodular goiter: Secondary | ICD-10-CM

## 2023-11-13 ENCOUNTER — Encounter (HOSPITAL_COMMUNITY)
Admission: RE | Admit: 2023-11-13 | Discharge: 2023-11-13 | Disposition: A | Discharge status: Home / Self Care | Source: Ambulatory Visit | Attending: Endocrinology | Admitting: Endocrinology

## 2023-11-13 DIAGNOSIS — E042 Nontoxic multinodular goiter: Secondary | ICD-10-CM | POA: Diagnosis present

## 2023-11-13 MED ORDER — SODIUM PERTECHNETATE TC 99M INJECTION
5.0000 | Freq: Once | INTRAVENOUS | Status: AC | PRN
  Administered 2023-11-13: 5 via INTRAVENOUS

## 2023-11-19 ENCOUNTER — Other Ambulatory Visit: Payer: Self-pay

## 2023-11-19 DIAGNOSIS — I872 Venous insufficiency (chronic) (peripheral): Secondary | ICD-10-CM

## 2023-11-29 ENCOUNTER — Ambulatory Visit (HOSPITAL_COMMUNITY)
Admission: RE | Admit: 2023-11-29 | Discharge: 2023-11-29 | Disposition: A | Discharge status: Home / Self Care | Source: Ambulatory Visit | Attending: Vascular Surgery | Admitting: Vascular Surgery

## 2023-11-29 DIAGNOSIS — I872 Venous insufficiency (chronic) (peripheral): Secondary | ICD-10-CM | POA: Diagnosis not present

## 2023-12-10 ENCOUNTER — Inpatient Hospital Stay (HOSPITAL_BASED_OUTPATIENT_CLINIC_OR_DEPARTMENT_OTHER): Payer: Medicare Other | Admitting: Family

## 2023-12-10 ENCOUNTER — Other Ambulatory Visit: Payer: Self-pay

## 2023-12-10 ENCOUNTER — Inpatient Hospital Stay: Payer: Medicare Other | Attending: Family

## 2023-12-10 ENCOUNTER — Encounter: Payer: Self-pay | Admitting: Family

## 2023-12-10 VITALS — BP 115/83 | HR 90 | Temp 97.7°F | Resp 19 | Ht 68.9 in | Wt 313.0 lb

## 2023-12-10 DIAGNOSIS — I70208 Unspecified atherosclerosis of native arteries of extremities, other extremity: Secondary | ICD-10-CM

## 2023-12-10 DIAGNOSIS — Z86711 Personal history of pulmonary embolism: Secondary | ICD-10-CM | POA: Diagnosis not present

## 2023-12-10 DIAGNOSIS — I2692 Saddle embolus of pulmonary artery without acute cor pulmonale: Secondary | ICD-10-CM | POA: Diagnosis not present

## 2023-12-10 DIAGNOSIS — Z886 Allergy status to analgesic agent status: Secondary | ICD-10-CM | POA: Insufficient documentation

## 2023-12-10 DIAGNOSIS — I742 Embolism and thrombosis of arteries of the upper extremities: Secondary | ICD-10-CM | POA: Diagnosis present

## 2023-12-10 DIAGNOSIS — D751 Secondary polycythemia: Secondary | ICD-10-CM

## 2023-12-10 DIAGNOSIS — M7989 Other specified soft tissue disorders: Secondary | ICD-10-CM | POA: Insufficient documentation

## 2023-12-10 DIAGNOSIS — D6859 Other primary thrombophilia: Secondary | ICD-10-CM | POA: Insufficient documentation

## 2023-12-10 DIAGNOSIS — R42 Dizziness and giddiness: Secondary | ICD-10-CM | POA: Insufficient documentation

## 2023-12-10 DIAGNOSIS — I2699 Other pulmonary embolism without acute cor pulmonale: Secondary | ICD-10-CM

## 2023-12-10 DIAGNOSIS — Z7901 Long term (current) use of anticoagulants: Secondary | ICD-10-CM | POA: Insufficient documentation

## 2023-12-10 DIAGNOSIS — I872 Venous insufficiency (chronic) (peripheral): Secondary | ICD-10-CM | POA: Insufficient documentation

## 2023-12-10 LAB — CBC WITH DIFFERENTIAL (CANCER CENTER ONLY)
Abs Immature Granulocytes: 0.02 10*3/uL (ref 0.00–0.07)
Basophils Absolute: 0 10*3/uL (ref 0.0–0.1)
Basophils Relative: 1 %
Eosinophils Absolute: 0.2 10*3/uL (ref 0.0–0.5)
Eosinophils Relative: 3 %
HCT: 49 % — ABNORMAL HIGH (ref 36.0–46.0)
Hemoglobin: 16.4 g/dL — ABNORMAL HIGH (ref 12.0–15.0)
Immature Granulocytes: 0 %
Lymphocytes Relative: 38 %
Lymphs Abs: 2.2 10*3/uL (ref 0.7–4.0)
MCH: 30.3 pg (ref 26.0–34.0)
MCHC: 33.5 g/dL (ref 30.0–36.0)
MCV: 90.6 fL (ref 80.0–100.0)
Monocytes Absolute: 0.4 10*3/uL (ref 0.1–1.0)
Monocytes Relative: 6 %
Neutro Abs: 3 10*3/uL (ref 1.7–7.7)
Neutrophils Relative %: 52 %
Platelet Count: 193 10*3/uL (ref 150–400)
RBC: 5.41 MIL/uL — ABNORMAL HIGH (ref 3.87–5.11)
RDW: 13.2 % (ref 11.5–15.5)
WBC Count: 5.8 10*3/uL (ref 4.0–10.5)
nRBC: 0 % (ref 0.0–0.2)

## 2023-12-10 LAB — CMP (CANCER CENTER ONLY)
ALT: 13 U/L (ref 0–44)
AST: 22 U/L (ref 15–41)
Albumin: 4.5 g/dL (ref 3.5–5.0)
Alkaline Phosphatase: 87 U/L (ref 38–126)
Anion gap: 6 (ref 5–15)
BUN: 19 mg/dL (ref 8–23)
CO2: 30 mmol/L (ref 22–32)
Calcium: 9.6 mg/dL (ref 8.9–10.3)
Chloride: 105 mmol/L (ref 98–111)
Creatinine: 0.74 mg/dL (ref 0.44–1.00)
GFR, Estimated: >60 mL/min (ref >60)
Glucose, Bld: 111 mg/dL — ABNORMAL HIGH (ref 70–99)
Potassium: 5.6 mmol/L — ABNORMAL HIGH (ref 3.5–5.1)
Sodium: 141 mmol/L (ref 135–145)
Total Bilirubin: 0.6 mg/dL (ref 0.0–1.2)
Total Protein: 7.2 g/dL (ref 6.5–8.1)
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
## 2023-12-11 ENCOUNTER — Encounter: Payer: Self-pay | Admitting: Family

## 2023-12-24 ENCOUNTER — Ambulatory Visit: Attending: Surgery | Admitting: Physician Assistant

## 2023-12-24 ENCOUNTER — Encounter: Payer: Self-pay | Admitting: Physician Assistant

## 2023-12-24 VITALS — BP 128/86 | HR 76 | Temp 98.3°F | Ht 68.9 in | Wt 310.8 lb

## 2023-12-24 DIAGNOSIS — I872 Venous insufficiency (chronic) (peripheral): Secondary | ICD-10-CM | POA: Diagnosis present

## 2023-12-24 DIAGNOSIS — M7989 Other specified soft tissue disorders: Secondary | ICD-10-CM

## 2023-12-24 NOTE — Progress Notes (Signed)
 Requested by:  Rey Catholic, MD [address] [place],  [place] [address]  Reason for consultation: BLE swelling and discoloration    History of Present Illness   Leslie Knapp is a 73 y.o. (1950/08/28) female who presents for evaluation of bilateral lower extremity swelling, aching and discoloration. She explains that she has noticed progression of her swelling and purple/pink discoloration of her toes since 2020. She was found to have a PE with DVT in her RUE at that time. 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 She has been managed on Eliquis  2.5 mg since as well. She says that her legs currently swell by the end of the day. They are improved with elevation. She just recently over past 3 weeks started wearing knee high compression stockings. She says these have helped her legs feel better. However, she does feel like they may be too small. The discoloration is worse in her toes on prolonged sitting. Improved as well with elevation. She says she sits in a recliner every couple hours during the day to elevate her legs. She does not regularly exercise. She uses rolling walker to ambulate.She has had both her knees and hips replaced so she is limited in her activity tolerance. She does report trying to lose weight. She says she has lost 20 lbs in last 4 months. She denies any burning, itching, bleeding, ulceration or pain in her legs. She has history of PE/DVT as stated above. No family history. However she does have extensive family history of venous disease.   Venous symptoms include: aching,swelling, discoloration Onset/duration:  >5 years  Occupation:  retired, previously worked a Sales promotion account executive job Aggravating factors: sitting, standing Alleviating factors: elevation, compression Compression:  yes Helps:  yes Pain medications:  ibuprofen Previous vein procedures:   no History of DVT:  no  Past Medical History:  Diagnosis Date   Acute saddle pulmonary embolism (HCC) 06/2019   Complication of anesthesia    wants neck supported during surgery due to hx of migraine caused by concussion   Concussion 2010, 2000 and 1998   periodiotic migraine and occ dizzines   COVID-19 09/2019   sx covid pneumonia,fever, chills body aches, took monoclonal antibody tx done symptoms resolved after 6 weeks   CTS (carpal tunnel syndrome)    Fatty liver disease, nonalcoholic    In [facility] liver biopsy 2010 did not show fatty liver   Fibromyalgia    History of blood transfusion 1975   after knee surgery   IBS (irritable bowel syndrome)    C/D   Localized swelling of both lower legs    go down with propping up of legs   Morbid obesity (HCC)    Osteoarthritis    oa   Pneumonia 09/2019   PONV (postoperative nausea and vomiting)    watch neck position needs support or gets n/v   Thyroid  nodule    sees ent dr Starling Eck for q year   Uterine polyp     Past Surgical History:  Procedure Laterality Date   CARDIAC CATHETERIZATION  03/21/2019   with pfo closure   HYSTEROSCOPY WITH D & C N/A 04/06/2020   Procedure: DILATATION AND CURETTAGE /HYSTEROSCOPY;  Surgeon: Lenord Radon, MD;  Location: [facility] [place];  Service: Gynecology;  Laterality: N/A;   IVC FILTER INSERTION  07/07/2019   IVC FILTER REMOVAL  2021   KNEE SURGERY  1975   Left Patella Tendon Replacement   LIVER BIOPSY  2010   PATENT FORAMEN OVALE(PFO) CLOSURE  03/21/2019   right arm blood clot removal     right knee surgery      TONSILLECTOMY  1963   TOTAL HIP ARTHROPLASTY Right 06/17/2021   Procedure: RIGHT TOTAL HIP ARTHROPLASTY ANTERIOR APPROACH;  Surgeon: Arnie Lao, MD;  Location: [facility];  Service: Orthopedics;  Laterality: Right;   TOTAL HIP ARTHROPLASTY Left 11/25/2021   Procedure: LEFT TOTAL HIP ARTHROPLASTY ANTERIOR APPROACH;  Surgeon: Arnie Lao, MD;  Location: [facility];  Service: Orthopedics;  Laterality: Left;   TOTAL KNEE ARTHROPLASTY  12/2003   Left   TOTAL KNEE ARTHROPLASTY  05/2004   Right   UTERINE POLYP REMOVAL  2010    Social History   Socioeconomic History   Marital status: Widowed    Spouse name: Not on file   Number of children: Not on file   Years of education: Not on file   Highest education level: Not on file  Occupational History   Occupation: retired  Tobacco Use   Smoking status: Never   Smokeless tobacco: Never  Vaping Use   Vaping status: Never Used  Substance and Sexual Activity   Alcohol use: No    Alcohol/week: 0.0 standard drinks of alcohol   Drug use: No   Sexual activity: Not Currently  Other Topics Concern   Not on file  Social History Narrative   Not on file   Social Drivers of Health   Financial Resource Strain: Low Risk  (04/20/2021)   Overall Financial Resource Strain (CARDIA)    Difficulty of Paying Living Expenses: Not hard at all  Food Insecurity: No Food Insecurity (04/20/2021)   Hunger Vital Sign    Worried About Running Out of Food in the Last Year: Never true    Ran Out of Food in the Last Year: Never true  Transportation Needs: No Transportation Needs (04/20/2021)   PRAPARE - Administrator, Civil Service (Medical): No    Lack of Transportation (Non-Medical): No  Physical Activity: Insufficiently Active (02/07/2021)   Exercise Vital Sign    Days of Exercise per Week: 3 days    Minutes of Exercise per Session: 30 min  Stress: No Stress Concern Present (04/20/2021)   [employer] of Occupational Health - Occupational Stress Questionnaire    Feeling of Stress : Not at all  Social Connections: Socially Isolated (02/07/2021)   Social Connection and Isolation Panel    Frequency of Communication with Friends and Family: More than three times a week    Frequency of Social Gatherings with Friends and Family: More than three times a week    Attends  Religious Services: Never    Database administrator or Organizations: No    Attends Banker Meetings: Never    Marital Status: Widowed  Intimate Partner Violence: Not At Risk (02/07/2021)   Humiliation, Afraid, Rape, and Kick questionnaire    Fear of Current or Ex-Partner: No    Emotionally Abused: No    Physically Abused: No    Sexually Abused: No   Family History  Problem Relation Age of Onset   Stroke Mother 85       Deceased   Hypertension Mother    GI problems Mother    Arthritis Mother    Dementia Mother    Heart defect Father 74  Deceased   Parkinson's disease Father    Varicose Veins Father    Stroke Maternal Grandfather    Arthritis Maternal Grandfather    Kidney failure Maternal Grandmother    Gallbladder disease Maternal Grandmother    Diverticulitis Sister        #1   Arthritis Sister    Healthy Sister        #2    Current Outpatient Medications  Medication Sig Dispense Refill   Cholecalciferol  25 MCG/0.03ML LIQD Take 1,000 Units by mouth daily.     Digestive Enzymes (ENZYME DIGEST) CAPS Take 1 capsule by mouth daily as needed (heartburn).     ELIQUIS  2.5 MG TABS tablet Take 1 tablet by mouth twice daily 60 tablet 0   ibuprofen (ADVIL) 200 MG tablet Take 400 mg by mouth at bedtime as needed.     Trace Min [id number] (MINERALS PO) Take 1 capsule by mouth daily.     No current facility-administered medications for this visit.    Allergies  Allergen Reactions   Celebrex [Celecoxib] Swelling and Rash   Lactose Intolerance (Gi) Other (See Comments)    REVIEW OF SYSTEMS (negative unless checked):   Cardiac:  []  Chest pain or chest pressure? []  Shortness of breath upon activity? []  Shortness of breath when lying flat? []  Irregular heart rhythm?  Vascular:  []  Pain in calf, thigh, or hip brought on by walking? []  Pain in feet at night that wakes you up from your sleep? []  Blood clot in your veins? [x]  Leg  swelling?  Pulmonary:  []  Oxygen at home? []  Productive cough? []  Wheezing?  Neurologic:  []  Sudden weakness in arms or legs? []  Sudden numbness in arms or legs? []  Sudden onset of difficult speaking or slurred speech? []  Temporary loss of vision in one eye? []  Problems with dizziness?  Gastrointestinal:  []  Blood in stool? []  Vomited blood?  Genitourinary:  []  Burning when urinating? []  Blood in urine?  Psychiatric:  []  Major depression  Hematologic:  []  Bleeding problems? []  Problems with blood clotting?  Dermatologic:  []  Rashes or ulcers?  Constitutional:  []  Fever or chills?  Ear/Nose/Throat:  []  Change in hearing? []  Nose bleeds? []  Sore throat?  Musculoskeletal:  []  Back pain? []  Joint pain? []  Muscle pain?   Physical Examination     Vitals:   12/24/23 1112  BP: 128/86  Pulse: 76  Temp: 98.3 F (36.8 C)  TempSrc: Temporal  SpO2: 96%  Weight: (!) 310 lb 12.8 oz (141 kg)  Height: 5' 8.9 (1.75 m)   Body mass index is 46.03 kg/m.  General:  WDWN in NAD; vital signs documented above Gait: Norma, ambulates with walker HENT: WNL, normocephalic Pulmonary: normal non-labored breathing Cardiac: regular HR Abdomen: soft Vascular Exam/Pulses: 2+ popliteal, DP and PT pulses bilaterally Extremities: without varicose veins, with reticular veins, without edema, without stasis pigmentation, without lipodermatosclerosis, without ulcers Musculoskeletal: no muscle wasting or atrophy  Neurologic: A&O X 3;  No focal weakness or paresthesias are detected Psychiatric:  The pt has Normal affect.  Non-invasive Vascular Imaging   BLE Venous Insufficiency Duplex (11/29/23): LLE: No DVT and SVT,  GSV reflux SFJ to mid thigh GSV diameter 0.64-0.74 cm No SSV reflux No deep venous reflux   Medical Decision Making   Biance Moncrief is a 73 y.o. female who presents with: BLE chronic venous insufficiency with swelling and aching. Duplex today shows no DVT  or SVT. She has reflux in her GSV. No SSV reflux  or deep reflux. Her GSV is large enough to be considered for lazer ablation. However, she will not be able to don and doff thigh high compression. She lives with her sister and requires her assistance to put on the knee high compression stockings. I think with her minimal symptoms she would best be managed with conservative therapy.  Based on the patient's history and examination, I recommend: daily elevation of 20-30 minutes above level of heart, daily compression stocking use, exercise, weight reduction, refraining from prolonged sitting or standing I discussed with the patient the use of her 15-20/20-30 mm knee high compression stockings  Patient was provided with information about vein health She can follow up with us  as needed if she has any new or worsening symptoms   Deneen Finical, PA-C [facility] Office: [phone]  12/24/2023, 11:16 AM  Clinic MD: Charlotte Cookey

## 2024-03-19 ENCOUNTER — Other Ambulatory Visit: Payer: Self-pay | Admitting: Hematology & Oncology

## 2024-03-19 DIAGNOSIS — I2699 Other pulmonary embolism without acute cor pulmonale: Secondary | ICD-10-CM

## 2024-04-16 ENCOUNTER — Encounter: Payer: Self-pay | Admitting: Family

## 2024-04-16 ENCOUNTER — Inpatient Hospital Stay: Attending: Family

## 2024-04-16 ENCOUNTER — Inpatient Hospital Stay (HOSPITAL_BASED_OUTPATIENT_CLINIC_OR_DEPARTMENT_OTHER): Admitting: Family

## 2024-04-16 VITALS — BP 121/86 | HR 75 | Temp 97.9°F | Resp 19 | Ht 68.0 in | Wt 309.1 lb

## 2024-04-16 DIAGNOSIS — D6859 Other primary thrombophilia: Secondary | ICD-10-CM | POA: Insufficient documentation

## 2024-04-16 DIAGNOSIS — I2692 Saddle embolus of pulmonary artery without acute cor pulmonale: Secondary | ICD-10-CM | POA: Insufficient documentation

## 2024-04-16 DIAGNOSIS — I2699 Other pulmonary embolism without acute cor pulmonale: Secondary | ICD-10-CM

## 2024-04-16 DIAGNOSIS — I70208 Unspecified atherosclerosis of native arteries of extremities, other extremity: Secondary | ICD-10-CM

## 2024-04-16 DIAGNOSIS — R5383 Other fatigue: Secondary | ICD-10-CM | POA: Insufficient documentation

## 2024-04-16 DIAGNOSIS — Z86711 Personal history of pulmonary embolism: Secondary | ICD-10-CM | POA: Diagnosis not present

## 2024-04-16 DIAGNOSIS — Z886 Allergy status to analgesic agent status: Secondary | ICD-10-CM | POA: Diagnosis not present

## 2024-04-16 DIAGNOSIS — Z7901 Long term (current) use of anticoagulants: Secondary | ICD-10-CM | POA: Insufficient documentation

## 2024-04-16 DIAGNOSIS — I742 Embolism and thrombosis of arteries of the upper extremities: Secondary | ICD-10-CM | POA: Insufficient documentation

## 2024-04-16 LAB — CBC WITH DIFFERENTIAL (CANCER CENTER ONLY)
Abs Immature Granulocytes: 0.02 K/uL (ref 0.00–0.07)
Basophils Absolute: 0.1 K/uL (ref 0.0–0.1)
Basophils Relative: 1 %
Eosinophils Absolute: 0.3 K/uL (ref 0.0–0.5)
Eosinophils Relative: 5 %
HCT: 47.7 % — ABNORMAL HIGH (ref 36.0–46.0)
Hemoglobin: 16 g/dL — ABNORMAL HIGH (ref 12.0–15.0)
Immature Granulocytes: 0 %
Lymphocytes Relative: 31 %
Lymphs Abs: 1.7 K/uL (ref 0.7–4.0)
MCH: 29.5 pg (ref 26.0–34.0)
MCHC: 33.5 g/dL (ref 30.0–36.0)
MCV: 88 fL (ref 80.0–100.0)
Monocytes Absolute: 0.5 K/uL (ref 0.1–1.0)
Monocytes Relative: 8 %
Neutro Abs: 3.1 K/uL (ref 1.7–7.7)
Neutrophils Relative %: 55 %
Platelet Count: 202 K/uL (ref 150–400)
RBC: 5.42 MIL/uL — ABNORMAL HIGH (ref 3.87–5.11)
RDW: 13.1 % (ref 11.5–15.5)
WBC Count: 5.6 K/uL (ref 4.0–10.5)
nRBC: 0 % (ref 0.0–0.2)

## 2024-04-16 LAB — CMP (CANCER CENTER ONLY)
ALT: 11 U/L (ref 0–44)
AST: 22 U/L (ref 15–41)
Albumin: 4.2 g/dL (ref 3.5–5.0)
Alkaline Phosphatase: 103 U/L (ref 38–126)
Anion gap: 12 (ref 5–15)
BUN: 16 mg/dL (ref 8–23)
CO2: 24 mmol/L (ref 22–32)
Calcium: 9.6 mg/dL (ref 8.9–10.3)
Chloride: 105 mmol/L (ref 98–111)
Creatinine: 0.71 mg/dL (ref 0.44–1.00)
GFR, Estimated: >60 mL/min (ref >60)
Glucose, Bld: 97 mg/dL (ref 70–99)
Potassium: 4.5 mmol/L (ref 3.5–5.1)
Sodium: 141 mmol/L (ref 135–145)
Total Bilirubin: 0.7 mg/dL (ref 0.0–1.2)
Total Protein: 6.9 g/dL (ref 6.5–8.1)

## 2024-04-16 NOTE — Progress Notes (Signed)
 Hematology and Oncology Follow Up Visit  Jahayra Mazo [id number] [birth year] 73 y.o. 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 Flinchbaugh is here today for follow-up. She is doing well overall. She notes some mild fatigue at times but has been doing therapy to increase her mobility. She feels that this has helped her tremendously.  No falls or syncope reported. She is ambulating with her Rolator for added support.  No obvious blood loss noted. She bruises easily but not in excess on the arms and legs. No petechiae. She istaking her maintenance Eliquis  as prescribed BID.  She has a patch of red dry skin on the outer right calf and will try using Aveeno lotion. If this does not improve she will follow-up with Dermatology. This area does not itch or burn.  No fever, chills, n/v, cough, rash, dizziness, SOB, chest pain, palpitations, abdominal pain or changes in bowel or bladder habits.  Appetite and hydration are good. Weight is stable at 309 lbs.   ECOG Performance Status: 1 - Symptomatic but completely ambulatory  Medications:  Allergies as of 04/16/2024       Reactions   Celebrex [celecoxib] Swelling, Rash   Lactose Intolerance (gi) Other (See Comments)        Medication List        Accurate as of April 16, 2024  8:39 AM. If you have any questions, ask your nurse or doctor.          Cholecalciferol  25 MCG/0.03ML Liqd Take 1,000 Units by mouth daily.   Eliquis  2.5 MG Tabs tablet Generic drug: apixaban  Take 1 tablet by mouth twice daily   Enzyme Digest Caps Take 1 capsule by mouth daily as needed (heartburn).   ibuprofen 200 MG tablet Commonly known as: ADVIL Take 400 mg by mouth at bedtime as needed.   MINERALS PO Take 1  capsule by mouth daily.        Allergies:  Allergies  Allergen Reactions   Celebrex [Celecoxib] Swelling and Rash   Lactose Intolerance (Gi) Other (See Comments)    Past Medical History, Surgical history, Social history, and Family History were reviewed and updated.  Review of Systems: All other 10 point review of systems is negative.   Physical Exam:  vitals were not taken for this visit.   Wt Readings from Last 3 Encounters:  12/24/23 (!) 310 lb 12.8 oz (141 kg)  12/10/23 (!) 313 lb (142 kg)  08/13/23 (!) 331 lb 1.9 oz (150.2 kg)    Ocular: Sclerae unicteric, pupils equal, round and reactive to light Ear-nose-throat: Oropharynx clear, dentition fair Lymphatic: No cervical or supraclavicular adenopathy Lungs no rales or rhonchi, good excursion bilaterally Heart regular rate and rhythm, no murmur appreciated Abd soft, nontender, positive bowel sounds MSK no focal spinal tenderness, no joint edema Neuro: non-focal, well-oriented, appropriate affect Breasts: Deferred   Lab Results  Component Value Date   WBC 5.6 04/16/2024   HGB 16.0 (H) 04/16/2024   HCT 47.7 (H) 04/16/2024   MCV 88.0 04/16/2024   PLT 202 04/16/2024   Lab Results  Component Value Date   FERRITIN 129 08/23/2020   IRON 63 08/23/2020   TIBC 383 08/23/2020   UIBC 319 08/23/2020   IRONPCTSAT 16 (L)  08/23/2020   Lab Results  Component Value Date   RBC 5.42 (H) 04/16/2024   No results found for: KPAFRELGTCHN, LAMBDASER, KAPLAMBRATIO No results found for: IGGSERUM, IGA, IGMSERUM No results found for: STEPHANY CARLOTA BENSON MARKEL EARLA JOANNIE DOC VICK, SPEI   Chemistry      Component Value Date/Time   NA 141 12/10/2023 0821   NA 140 09/24/2019 1934   K 5.6 (H) 12/10/2023 0821   CL 105 12/10/2023 0821   CO2 30 12/10/2023 0821   BUN 19 12/10/2023 0821   BUN 15 09/24/2019 1934   CREATININE 0.74 12/10/2023 0821   GLU 87 05/21/2018 0000       Component Value Date/Time   CALCIUM 9.6 12/10/2023 0821   ALKPHOS 87 12/10/2023 0821   AST 22 12/10/2023 0821   ALT 13 12/10/2023 0821   BILITOT 0.6 12/10/2023 0821       Impression and Plan: Ms. Goller is a very pleasant 73 yo caucasian female with history of bilateral saddle pulmonary emboli with right heart strain as well as a right brachial artery thrombus. She was noted to have a protein C and protein S deficiency at the time.  No recent protein S or C deficiency noted.  Continue same regimen with Eliquis .  Follow-up in 4 months.   Lauraine Pepper, NP 10/8/20258:39 AM

## 2024-04-21 ENCOUNTER — Other Ambulatory Visit: Payer: Self-pay | Admitting: Hematology & Oncology

## 2024-04-21 DIAGNOSIS — I2699 Other pulmonary embolism without acute cor pulmonale: Secondary | ICD-10-CM

## 2024-04-22 ENCOUNTER — Encounter (HOSPITAL_BASED_OUTPATIENT_CLINIC_OR_DEPARTMENT_OTHER): Payer: Self-pay | Admitting: Family Medicine

## 2024-04-24 ENCOUNTER — Other Ambulatory Visit (HOSPITAL_BASED_OUTPATIENT_CLINIC_OR_DEPARTMENT_OTHER): Payer: Self-pay | Admitting: Family Medicine

## 2024-04-24 ENCOUNTER — Telehealth (HOSPITAL_BASED_OUTPATIENT_CLINIC_OR_DEPARTMENT_OTHER): Payer: Self-pay

## 2024-04-24 DIAGNOSIS — R52 Pain, unspecified: Secondary | ICD-10-CM

## 2024-04-30 ENCOUNTER — Ambulatory Visit (HOSPITAL_BASED_OUTPATIENT_CLINIC_OR_DEPARTMENT_OTHER)
Admission: RE | Admit: 2024-04-30 | Discharge: 2024-04-30 | Disposition: A | Discharge status: Home / Self Care | Source: Ambulatory Visit | Attending: Family Medicine | Admitting: Family Medicine

## 2024-04-30 DIAGNOSIS — R52 Pain, unspecified: Secondary | ICD-10-CM | POA: Insufficient documentation

## 2024-05-06 ENCOUNTER — Other Ambulatory Visit: Payer: Self-pay | Admitting: Endocrinology

## 2024-05-06 DIAGNOSIS — E042 Nontoxic multinodular goiter: Secondary | ICD-10-CM

## 2024-05-07 ENCOUNTER — Ambulatory Visit
Admission: RE | Admit: 2024-05-07 | Discharge: 2024-05-07 | Disposition: A | Discharge status: Home / Self Care | Source: Ambulatory Visit | Attending: Endocrinology | Admitting: Endocrinology

## 2024-05-07 DIAGNOSIS — E042 Nontoxic multinodular goiter: Secondary | ICD-10-CM

## 2024-05-09 ENCOUNTER — Other Ambulatory Visit: Payer: Self-pay | Admitting: Endocrinology

## 2024-05-09 DIAGNOSIS — E042 Nontoxic multinodular goiter: Secondary | ICD-10-CM

## 2024-05-13 ENCOUNTER — Ambulatory Visit
Admission: RE | Admit: 2024-05-13 | Discharge: 2024-05-13 | Disposition: A | Discharge status: Home / Self Care | Source: Ambulatory Visit | Attending: Endocrinology | Admitting: Endocrinology

## 2024-05-13 DIAGNOSIS — E042 Nontoxic multinodular goiter: Secondary | ICD-10-CM

## 2024-06-23 ENCOUNTER — Other Ambulatory Visit: Payer: Self-pay | Admitting: Hematology & Oncology

## 2024-06-23 DIAGNOSIS — I2699 Other pulmonary embolism without acute cor pulmonale: Secondary | ICD-10-CM

## 2024-07-29 ENCOUNTER — Other Ambulatory Visit: Payer: Self-pay | Admitting: Hematology & Oncology

## 2024-07-29 DIAGNOSIS — I2699 Other pulmonary embolism without acute cor pulmonale: Secondary | ICD-10-CM

## 2024-08-18 ENCOUNTER — Inpatient Hospital Stay: Attending: Family

## 2024-08-18 ENCOUNTER — Inpatient Hospital Stay: Admitting: Family
